# Patient Record
Sex: Female | Born: 1980 | Race: Black or African American | Hispanic: No | Marital: Married | Smoking: Never smoker
Health system: Southern US, Community
[De-identification: ages and names within clinical notes are randomized; demographics above are authoritative.]

## PROBLEM LIST (undated history)

## (undated) ENCOUNTER — Inpatient Hospital Stay (HOSPITAL_COMMUNITY): Payer: Self-pay

## (undated) DIAGNOSIS — R200 Anesthesia of skin: Secondary | ICD-10-CM

## (undated) DIAGNOSIS — G8929 Other chronic pain: Secondary | ICD-10-CM

## (undated) DIAGNOSIS — O24419 Gestational diabetes mellitus in pregnancy, unspecified control: Secondary | ICD-10-CM

## (undated) DIAGNOSIS — E119 Type 2 diabetes mellitus without complications: Secondary | ICD-10-CM

## (undated) DIAGNOSIS — R202 Paresthesia of skin: Secondary | ICD-10-CM

## (undated) HISTORY — DX: Type 2 diabetes mellitus without complications: E11.9

## (undated) HISTORY — DX: Anesthesia of skin: R20.2

## (undated) HISTORY — DX: Other chronic pain: G89.29

## (undated) HISTORY — DX: Anesthesia of skin: R20.0

---

## 2014-08-28 ENCOUNTER — Inpatient Hospital Stay (HOSPITAL_COMMUNITY)
Admission: AD | Admit: 2014-08-28 | Discharge: 2014-08-28 | Disposition: A | Discharge status: Home / Self Care | Payer: Self-pay | Source: Ambulatory Visit | Attending: Obstetrics & Gynecology | Admitting: Obstetrics & Gynecology

## 2014-08-28 ENCOUNTER — Inpatient Hospital Stay (HOSPITAL_COMMUNITY): Payer: Self-pay

## 2014-08-28 ENCOUNTER — Encounter (HOSPITAL_COMMUNITY): Payer: Self-pay | Admitting: *Deleted

## 2014-08-28 DIAGNOSIS — O020 Blighted ovum and nonhydatidiform mole: Secondary | ICD-10-CM | POA: Insufficient documentation

## 2014-08-28 DIAGNOSIS — R102 Pelvic and perineal pain: Secondary | ICD-10-CM | POA: Insufficient documentation

## 2014-08-28 DIAGNOSIS — R58 Hemorrhage, not elsewhere classified: Secondary | ICD-10-CM

## 2014-08-28 DIAGNOSIS — O0289 Other abnormal products of conception: Secondary | ICD-10-CM

## 2014-08-28 LAB — CBC
HCT: 32.7 % — ABNORMAL LOW (ref 36.0–46.0)
Hemoglobin: 10.7 g/dL — ABNORMAL LOW (ref 12.0–15.0)
MCH: 25.5 pg — ABNORMAL LOW (ref 26.0–34.0)
MCHC: 32.7 g/dL (ref 30.0–36.0)
MCV: 78 fL (ref 78.0–100.0)
Platelets: 259 10*3/uL (ref 150–400)
RBC: 4.19 MIL/uL (ref 3.87–5.11)
RDW: 21 % — ABNORMAL HIGH (ref 11.5–15.5)
WBC: 9.6 10*3/uL (ref 4.0–10.5)

## 2014-08-28 LAB — COMPREHENSIVE METABOLIC PANEL
ALT: 20 U/L (ref 0–35)
AST: 20 U/L (ref 0–37)
Albumin: 3.8 g/dL (ref 3.5–5.2)
Alkaline Phosphatase: 66 U/L (ref 39–117)
Anion gap: 10 (ref 5–15)
BUN: 7 mg/dL (ref 6–23)
CO2: 25 mmol/L (ref 19–32)
Calcium: 9.5 mg/dL (ref 8.4–10.5)
Chloride: 100 mmol/L (ref 96–112)
Creatinine, Ser: 0.63 mg/dL (ref 0.50–1.10)
GFR calc Af Amer: >90 mL/min (ref >90)
GFR calc non Af Amer: >90 mL/min (ref >90)
Glucose, Bld: 82 mg/dL (ref 70–99)
Potassium: 3.3 mmol/L — ABNORMAL LOW (ref 3.5–5.1)
Sodium: 135 mmol/L (ref 135–145)
Total Bilirubin: 0.4 mg/dL (ref 0.3–1.2)
Total Protein: 8 g/dL (ref 6.0–8.3)

## 2014-08-28 LAB — DIFFERENTIAL
Basophils Absolute: 0 10*3/uL (ref 0.0–0.1)
Basophils Relative: 0 % (ref 0–1)
Eosinophils Absolute: 0.7 10*3/uL (ref 0.0–0.7)
Eosinophils Relative: 8 % — ABNORMAL HIGH (ref 0–5)
Lymphocytes Relative: 25 % (ref 12–46)
Lymphs Abs: 2.3 10*3/uL (ref 0.7–4.0)
Monocytes Absolute: 0.5 10*3/uL (ref 0.1–1.0)
Monocytes Relative: 5 % (ref 3–12)
Neutro Abs: 5.7 10*3/uL (ref 1.7–7.7)
Neutrophils Relative %: 62 % (ref 43–77)

## 2014-08-28 LAB — HCG, QUANTITATIVE, PREGNANCY: hCG, Beta Chain, Quant, S: 129683 m[IU]/mL — ABNORMAL HIGH (ref <5)

## 2014-08-28 LAB — URINALYSIS, ROUTINE W REFLEX MICROSCOPIC
Bilirubin Urine: NEGATIVE
Glucose, UA: NEGATIVE mg/dL
Hgb urine dipstick: NEGATIVE
Ketones, ur: NEGATIVE mg/dL
Leukocytes, UA: NEGATIVE
Nitrite: NEGATIVE
PROTEIN: NEGATIVE mg/dL
Specific Gravity, Urine: 1.01 (ref 1.005–1.030)
UROBILINOGEN UA: 0.2 mg/dL (ref 0.0–1.0)
pH: 6 (ref 5.0–8.0)

## 2014-08-28 LAB — HEPATITIS B SURFACE ANTIGEN: Hepatitis B Surface Ag: NEGATIVE

## 2014-08-28 LAB — WET PREP, GENITAL
Trich, Wet Prep: NONE SEEN
Yeast Wet Prep HPF POC: NONE SEEN

## 2014-08-28 LAB — PREPARE RBC (CROSSMATCH)

## 2014-08-28 LAB — POCT PREGNANCY, URINE: PREG TEST UR: POSITIVE — AB

## 2014-08-28 LAB — ABO/RH: ABO/RH(D): O POS

## 2014-08-28 NOTE — Discharge Instructions (Signed)
Molar Pregnancy A molar pregnancy (hydatidiform mole) is a mass of tissue that grows in the uterus after conception. The mass is created by an egg that was not fertilized correctly and abnormally grows. It is an abnormal pregnancy and does not develop into a fetus. If a molar pregnancy is suspected by your health care provider, treatment is required. CAUSES  Molar pregnancy is caused by an egg that is fertilized incorrectly so that it has abnormal genetic material (chromosomes). This can result in one of 2 types of molar pregnancy:  Complete molar pregnancy--All of the chromosomes in the fertilized egg come from the father; none come from the mother.  Partial molar pregnancy--The fertilized egg has chromosomes from the father and mother, but it has too many chromosomes. RISK FACTORS  Certain risk factors make a molar pregnancy more likely. They include:   Being over age 35 or under age 20.  History of a molar pregnancy in the past (extremely small chance of recurrence). Other possible risk factors include:   Smoking more than 15 cigarettes per day.  History of infertility.  Having a certain blood type (A, B, AB).  Having a vitamin A deficiency.  Using oral contraceptives. SIGNS AND SYMPTOMS   Vaginal bleeding.  Missed menstrual period.  Uterus grows quicker than normal.  Severe nausea and vomiting.  Severe pressure or pain in the uterus.  Abnormal ovarian cysts (theca lutein cysts).  Discharge from the vagina that looks like grapes.  High blood pressure (early onset of preeclampsia).  Overactive thyroid (hyperthyroidism).  Anemia. DIAGNOSIS  If your health care provider thinks there is a chance of a molar pregnancy, testing will be recommended. Possible tests include:   An ultrasound test.  Blood tests. TREATMENT  Most molar pregnancies end on their own by miscarriage. However, a health care provider needs to make sure that all the abnormal tissue is out of the  womb. This can be done with dilation and curettage (D&C) or suction curettage. In this procedure, any remaining molar tissue is removed through the vagina. After diagnosis of a molar pregnancy, the pregnancy hormone levels must be followed until the level is zero. If the pregnancy hormone level does not drop appropriately, chemotherapy may be necessary. Also, you will be given a medicine called Rho (D) immune globulin if you are Rh negative and your sex partner is Rh positive. This helps prevent Rh problems in future pregnancies. HOME CARE INSTRUCTIONS   Avoid getting pregnant for 6-12 months or as directed by your health care provider. Use a reliable form of birth control or do not have sex.  Only take over-the-counter or prescription medicine as directed by your health care provider.  Keep all follow-up appointments and get all suggested lab tests and ultrasound tests.  Gradually return to normal activities.  Think about joining a support group. Ask for help if you are struggling with grief. Document Released: 01/16/2011 Document Revised: 09/14/2013 Document Reviewed: 11/27/2012 [employer] Patient Information 2015 [employer]. This information is not intended to replace advice given to you by your health care provider. Make sure you discuss any questions you have with your health care provider.  

## 2014-08-28 NOTE — MAU Note (Signed)
Pt was lying down and felt like she needed to use the bathroom then when she went it was just blood that came out with some small clots.  Denies abdominal pain but does have some pressure.

## 2014-08-28 NOTE — MAU Provider Note (Signed)
History     CSN: [id number]  Arrival date and time: 08/28/14 1255   None     Chief Complaint  Patient presents with  . Vaginal Bleeding   Vaginal Bleeding The patient's primary symptoms include missed menses, pelvic pain and vaginal bleeding. This is a new problem. The current episode started today. The pain is mild. She is pregnant. Associated symptoms include abdominal pain. The vaginal bleeding is heavier than menses. She has been passing clots. She has not been passing tissue. Nothing aggravates the symptoms. She has tried nothing for the symptoms. She is sexually active. It is unknown whether or not her partner has an STD.    34 y.o. [id number]  presents to the MAU with the complaint of when she was  Expand All Collapse All    lying down and felt like she needed to use the bathroom then when she went it was just blood that came out with some small clots. Denies abdominal pain but does have some pressure.       Past Medical History  Diagnosis Date  . Medical history non-contributory     Past Surgical History  Procedure Laterality Date  . Cesarean section      History reviewed. No pertinent family history.  History  Substance Use Topics  . Smoking status: Never Smoker   . Smokeless tobacco: Not on file  . Alcohol Use: No    Allergies: No Known Allergies  Prescriptions prior to admission  Medication Sig Dispense Refill Last Dose  . Prenatal Vit-Fe Fumarate-FA (PRENATAL MULTIVITAMIN) TABS tablet Take 1 tablet by mouth daily at 12 noon.   08/27/2014 at Unknown time    Review of Systems  Eyes: Positive for redness.       Right eye  Gastrointestinal: Positive for abdominal pain.  Genitourinary: Positive for vaginal bleeding, pelvic pain and missed menses.       Vaginal bleeding  All other systems reviewed and are negative.  Physical Exam   Blood pressure 142/87, pulse 92, temperature 97.5 F (36.4 C), temperature source Oral, resp. rate 18, last  menstrual period 06/19/2014.  Physical Exam  Nursing note and vitals reviewed. Constitutional: She is oriented to person, place, and time. She appears well-developed and well-nourished. No distress.  HENT:  Head: Normocephalic and atraumatic.  Eyes:  right eye red  Cardiovascular: Normal rate.   Respiratory: Effort normal. No respiratory distress.  Genitourinary: Vagina normal. There is no rash, tenderness, lesion or injury on the right labia. There is no rash, tenderness, lesion or injury on the left labia. Uterus is enlarged. Cervix exhibits discharge.    Musculoskeletal: Normal range of motion.  Neurological: She is alert and oriented to person, place, and time.  Skin: Skin is warm and dry.  Psychiatric: She has a normal mood and affect. Her behavior is normal. Judgment and thought content normal.    MAU Course  Procedures  MDM Results for orders placed or performed during the hospital encounter of 08/28/14 (from the past 24 hour(s))  CBC     Status: Abnormal   Collection Time: 08/28/14  1:54 PM  Result Value Ref Range   WBC 9.6 4.0 - 10.5 K/uL   RBC 4.19 3.87 - 5.11 MIL/uL   Hemoglobin 10.7 (L) 12.0 - 15.0 g/dL   HCT 78.4 (L) 69.6 - 29.5 %   MCV 78.0 78.0 - 100.0 fL   MCH 25.5 (L) 26.0 - 34.0 pg   MCHC 32.7 30.0 - 36.0 g/dL   RDW  21.0 (H) 11.5 - 15.5 %   Platelets 259 150 - 400 K/uL  ABO/Rh     Status: None (Preliminary result)   Collection Time: 08/28/14  1:54 PM  Result Value Ref Range   ABO/RH(D) O POS   hCG, quantitative, pregnancy     Status: Abnormal   Collection Time: 08/28/14  1:54 PM  Result Value Ref Range   hCG, Beta Nyra JabsChain, Quant, S [id number] (H) <5 mIU/mL  Urinalysis, Routine w reflex microscopic     Status: None   Collection Time: 08/28/14  1:54 PM  Result Value Ref Range   Color, Urine YELLOW YELLOW   APPearance CLEAR CLEAR   Specific Gravity, Urine 1.010 1.005 - 1.030   pH 6.0 5.0 - 8.0   Glucose, UA NEGATIVE NEGATIVE mg/dL   Hgb urine dipstick  NEGATIVE NEGATIVE   Bilirubin Urine NEGATIVE NEGATIVE   Ketones, ur NEGATIVE NEGATIVE mg/dL   Protein, ur NEGATIVE NEGATIVE mg/dL   Urobilinogen, UA 0.2 0.0 - 1.0 mg/dL   Nitrite NEGATIVE NEGATIVE   Leukocytes, UA NEGATIVE NEGATIVE  Differential     Status: Abnormal   Collection Time: 08/28/14  1:54 PM  Result Value Ref Range   Neutrophils Relative % 62 43 - 77 %   Neutro Abs 5.7 1.7 - 7.7 K/uL   Lymphocytes Relative 25 12 - 46 %   Lymphs Abs 2.3 0.7 - 4.0 K/uL   Monocytes Relative 5 3 - 12 %   Monocytes Absolute 0.5 0.1 - 1.0 K/uL   Eosinophils Relative 8 (H) 0 - 5 %   Eosinophils Absolute 0.7 0.0 - 0.7 K/uL   Basophils Relative 0 0 - 1 %   Basophils Absolute 0.0 0.0 - 0.1 K/uL  Comprehensive metabolic panel     Status: Abnormal   Collection Time: 08/28/14  1:54 PM  Result Value Ref Range   Sodium 135 135 - 145 mmol/L   Potassium 3.3 (L) 3.5 - 5.1 mmol/L   Chloride 100 96 - 112 mmol/L   CO2 25 19 - 32 mmol/L   Glucose, Bld 82 70 - 99 mg/dL   BUN 7 6 - 23 mg/dL   Creatinine, Ser 9.140.63 0.50 - 1.10 mg/dL   Calcium 9.5 8.4 - 78.210.5 mg/dL   Total Protein 8.0 6.0 - 8.3 g/dL   Albumin 3.8 3.5 - 5.2 g/dL   AST 20 0 - 37 U/L   ALT 20 0 - 35 U/L   Alkaline Phosphatase 66 39 - 117 U/L   Total Bilirubin 0.4 0.3 - 1.2 mg/dL   GFR calc non Af Amer >90 >90 mL/min   GFR calc Af Amer >90 >90 mL/min   Anion gap 10 5 - 15  Pregnancy, urine POC     Status: Abnormal   Collection Time: 08/28/14  2:00 PM  Result Value Ref Range   Preg Test, Ur POSITIVE (A) NEGATIVE  Wet prep, genital     Status: Abnormal   Collection Time: 08/28/14  2:40 PM  Result Value Ref Range   Yeast Wet Prep HPF POC NONE SEEN NONE SEEN   Trich, Wet Prep NONE SEEN NONE SEEN   Clue Cells Wet Prep HPF POC FEW (A) NONE SEEN   WBC, Wet Prep HPF POC FEW (A) NONE SEEN   [place] Ob Comp Less 14 Wks  08/28/2014   CLINICAL DATA:  Patient with vaginal bleeding. Gestational age by LMP 10 weeks 0 days.  EXAM: OBSTETRIC <14 WK US AND  TRANSVAGINAL OB [place]  TECHNIQUE: Both transabdominal and transvaginal ultrasound examinations were performed for complete evaluation of the gestation as well as the maternal uterus, adnexal regions, and pelvic cul-de-sac. Transvaginal technique was performed to assess early pregnancy.  COMPARISON:  None.  FINDINGS: Intrauterine gestational sac: Not present  Yolk sac:  Not present  Embryo:  Not present  Cardiac Activity: Not present  Maternal uterus/adnexae: The left ovary is unremarkable and measures 3.8 x 3.2 x 2.6 cm. The right ovary is unremarkable and measures 3.0 x 2.2 x 2.5 cm. Endometrial canal is distended and the endometrium appears to contain multiple cystic spaces with associated intervening soft tissue, findings measure 4.4 x 3.3 x 4.6 cm. No free fluid in the pelvis.  IMPRESSION: Marked thickening enlargement of the endometrium containing multiple cystic spaces. No intrauterine pregnancy is identified. In the setting of positive pregnancy test, considerations for the endometrial findings may be secondary to molar pregnancy. Recommend correlation with clinical factors and beta HCG levels. As no definite intrauterine pregnancy is identified, this be considered a pregnancy of unknown location and continued clinical followup with sonography and beta HCG levels is recommended.  These results were called by telephone at the time of interpretation on 08/28/2014 at 2:53 pm to Dr. Illene Bolus , who verbally acknowledged these results.   Electronically Signed   By: Annia Belt M.D.   On: 08/28/2014 14:56   US Ob Transvaginal  08/28/2014   CLINICAL DATA:  Patient with vaginal bleeding. Gestational age by LMP 10 weeks 0 days.  EXAM: OBSTETRIC <14 WK [place] AND TRANSVAGINAL OB US  TECHNIQUE: Both transabdominal and transvaginal ultrasound examinations were performed for complete evaluation of the gestation as well as the maternal uterus, adnexal regions, and pelvic cul-de-sac. Transvaginal technique was performed to  assess early pregnancy.  COMPARISON:  None.  FINDINGS: Intrauterine gestational sac: Not present  Yolk sac:  Not present  Embryo:  Not present  Cardiac Activity: Not present  Maternal uterus/adnexae: The left ovary is unremarkable and measures 3.8 x 3.2 x 2.6 cm. The right ovary is unremarkable and measures 3.0 x 2.2 x 2.5 cm. Endometrial canal is distended and the endometrium appears to contain multiple cystic spaces with associated intervening soft tissue, findings measure 4.4 x 3.3 x 4.6 cm. No free fluid in the pelvis.  IMPRESSION: Marked thickening enlargement of the endometrium containing multiple cystic spaces. No intrauterine pregnancy is identified. In the setting of positive pregnancy test, considerations for the endometrial findings may be secondary to molar pregnancy. Recommend correlation with clinical factors and beta HCG levels. As no definite intrauterine pregnancy is identified, this be considered a pregnancy of unknown location and continued clinical followup with sonography and beta HCG levels is recommended.  These results were called by telephone at the time of interpretation on 08/28/2014 at 2:53 pm to Dr. Illene Bolus , who verbally acknowledged these results.   Electronically Signed   By: Annia Belt M.D.   On: 08/28/2014 14:56   Dr. Penne Lash consulted. Dr. Penne Lash discussed POC with pt.   Assessment and Plan  Molar Pregnancy  Pt will be discharged and will be scheduled for Monday, D&E 1100- Constant Pt to arrive 930; NPO after midnight Discharged to home.  Dmya Long Grissett 08/28/2014, 3:52 PM

## 2014-08-29 LAB — RPR: RPR Ser Ql: NONREACTIVE

## 2014-08-29 LAB — RUBELLA SCREEN: Rubella: 14.6 index (ref >0.99)

## 2014-08-29 LAB — HIV ANTIBODY (ROUTINE TESTING W REFLEX): HIV Screen 4th Generation wRfx: NONREACTIVE

## 2014-08-30 ENCOUNTER — Ambulatory Visit (HOSPITAL_COMMUNITY): Payer: Commercial Managed Care - PPO | Admitting: Anesthesiology

## 2014-08-30 ENCOUNTER — Ambulatory Visit (HOSPITAL_COMMUNITY)
Admission: RE | Admit: 2014-08-30 | Discharge: 2014-08-30 | Disposition: A | Discharge status: Home / Self Care | Payer: Commercial Managed Care - PPO | Source: Ambulatory Visit | Attending: Obstetrics and Gynecology | Admitting: Obstetrics and Gynecology

## 2014-08-30 ENCOUNTER — Encounter (HOSPITAL_COMMUNITY)
Admission: RE | Disposition: A | Discharge status: Home / Self Care | Payer: Self-pay | Source: Ambulatory Visit | Attending: Obstetrics and Gynecology

## 2014-08-30 DIAGNOSIS — O020 Blighted ovum and nonhydatidiform mole: Secondary | ICD-10-CM | POA: Diagnosis present

## 2014-08-30 DIAGNOSIS — O0289 Other abnormal products of conception: Secondary | ICD-10-CM

## 2014-08-30 HISTORY — PX: DILATION AND EVACUATION: SHX1459

## 2014-08-30 LAB — GC/CHLAMYDIA PROBE AMP (~~LOC~~) NOT AT ARMC
Chlamydia: NEGATIVE
Neisseria Gonorrhea: NEGATIVE

## 2014-08-30 SURGERY — DILATION AND EVACUATION, UTERUS
Anesthesia: Monitor Anesthesia Care

## 2014-08-30 MED ORDER — IBUPROFEN 600 MG PO TABS: 600.0000 mg | ORAL_TABLET | Freq: Four times a day (QID) | ORAL | Status: DC | PRN

## 2014-08-30 MED ORDER — MIDAZOLAM HCL 2 MG/2ML IJ SOLN
INTRAMUSCULAR | Status: AC
  Filled 2014-08-30: qty 2

## 2014-08-30 MED ORDER — PROPOFOL 10 MG/ML IV BOLUS
INTRAVENOUS | Status: AC
  Filled 2014-08-30: qty 20

## 2014-08-30 MED ORDER — FENTANYL CITRATE (PF) 100 MCG/2ML IJ SOLN
INTRAMUSCULAR | Status: AC
  Filled 2014-08-30: qty 2

## 2014-08-30 MED ORDER — SCOPOLAMINE 1 MG/3DAYS TD PT72
1.0000 | MEDICATED_PATCH | Freq: Once | TRANSDERMAL | Status: AC
  Administered 2014-08-30: 1 via TRANSDERMAL

## 2014-08-30 MED ORDER — CHLOROPROCAINE HCL 1 % IJ SOLN
INTRAMUSCULAR | Status: AC
  Filled 2014-08-30: qty 30

## 2014-08-30 MED ORDER — DEXAMETHASONE SODIUM PHOSPHATE 10 MG/ML IJ SOLN
INTRAMUSCULAR | Status: AC
  Filled 2014-08-30: qty 1

## 2014-08-30 MED ORDER — FENTANYL CITRATE (PF) 100 MCG/2ML IJ SOLN
INTRAMUSCULAR | Status: DC | PRN
  Administered 2014-08-30: 100 ug via INTRAVENOUS

## 2014-08-30 MED ORDER — KETOROLAC TROMETHAMINE 30 MG/ML IJ SOLN
INTRAMUSCULAR | Status: AC
  Filled 2014-08-30: qty 1

## 2014-08-30 MED ORDER — LIDOCAINE HCL (CARDIAC) 20 MG/ML IV SOLN
INTRAVENOUS | Status: DC | PRN
  Administered 2014-08-30: 50 mg via INTRAVENOUS

## 2014-08-30 MED ORDER — ONDANSETRON HCL 4 MG/2ML IJ SOLN
INTRAMUSCULAR | Status: AC
  Filled 2014-08-30: qty 2

## 2014-08-30 MED ORDER — CHLOROPROCAINE HCL 1 % IJ SOLN
INTRAMUSCULAR | Status: DC | PRN
  Administered 2014-08-30: 20 mL

## 2014-08-30 MED ORDER — LACTATED RINGERS IV SOLN
INTRAVENOUS | Status: DC
  Administered 2014-08-30 (×2): via INTRAVENOUS

## 2014-08-30 MED ORDER — GLYCOPYRROLATE 0.2 MG/ML IJ SOLN
INTRAMUSCULAR | Status: DC | PRN
  Administered 2014-08-30: 0.2 mg via INTRAVENOUS

## 2014-08-30 MED ORDER — ONDANSETRON HCL 4 MG/2ML IJ SOLN
INTRAMUSCULAR | Status: DC | PRN
  Administered 2014-08-30: 4 mg via INTRAVENOUS

## 2014-08-30 MED ORDER — MIDAZOLAM HCL 2 MG/2ML IJ SOLN
INTRAMUSCULAR | Status: DC | PRN
  Administered 2014-08-30: 2 mg via INTRAVENOUS

## 2014-08-30 MED ORDER — DEXAMETHASONE SODIUM PHOSPHATE 10 MG/ML IJ SOLN
INTRAMUSCULAR | Status: DC | PRN
  Administered 2014-08-30: 5 mg via INTRAVENOUS

## 2014-08-30 MED ORDER — OXYCODONE-ACETAMINOPHEN 5-325 MG PO TABS: 1.0000 | ORAL_TABLET | ORAL | Status: DC | PRN

## 2014-08-30 MED ORDER — LIDOCAINE HCL (CARDIAC) 20 MG/ML IV SOLN
INTRAVENOUS | Status: AC
  Filled 2014-08-30: qty 5

## 2014-08-30 MED ORDER — PROPOFOL INFUSION 10 MG/ML OPTIME
INTRAVENOUS | Status: DC | PRN
  Administered 2014-08-30: 300 ug/kg/min via INTRAVENOUS
  Administered 2014-08-30: 11:00:00 via INTRAVENOUS

## 2014-08-30 MED ORDER — GLYCOPYRROLATE 0.2 MG/ML IJ SOLN
INTRAMUSCULAR | Status: AC
  Filled 2014-08-30: qty 1

## 2014-08-30 MED ORDER — DOCUSATE SODIUM 100 MG PO CAPS: 100.0000 mg | ORAL_CAPSULE | Freq: Two times a day (BID) | ORAL | Status: DC | PRN

## 2014-08-30 MED ORDER — KETOROLAC TROMETHAMINE 30 MG/ML IJ SOLN
INTRAMUSCULAR | Status: DC | PRN
  Administered 2014-08-30: 30 mg via INTRAVENOUS

## 2014-08-30 MED ORDER — SCOPOLAMINE 1 MG/3DAYS TD PT72
MEDICATED_PATCH | TRANSDERMAL | Status: AC
  Administered 2014-08-30 (×2): 1 via TRANSDERMAL
  Filled 2014-08-30: qty 1

## 2014-08-30 SURGICAL SUPPLY — 17 items
CATH ROBINSON RED A/P 16FR (CATHETERS) ×3 IMPLANT
DECANTER SPIKE VIAL GLASS SM (MISCELLANEOUS) ×3 IMPLANT
GLOVE BIOGEL PI IND STRL 6.5 (GLOVE) ×1 IMPLANT
GLOVE BIOGEL PI INDICATOR 6.5 (GLOVE) ×2
GLOVE SURG SS PI 6.0 STRL IVOR (GLOVE) ×3 IMPLANT
GOWN STRL REUS W/TWL LRG LVL3 (GOWN DISPOSABLE) ×6 IMPLANT
KIT BERKELEY 1ST TRIMESTER 3/8 (MISCELLANEOUS) ×3 IMPLANT
NS IRRIG 1000ML POUR BTL (IV SOLUTION) ×3 IMPLANT
PACK VAGINAL MINOR WOMEN LF (CUSTOM PROCEDURE TRAY) ×3 IMPLANT
PAD OB MATERNITY 4.3X12.25 (PERSONAL CARE ITEMS) ×3 IMPLANT
PAD PREP 24X48 CUFFED NSTRL (MISCELLANEOUS) ×3 IMPLANT
SET BERKELEY SUCTION TUBING (SUCTIONS) ×3 IMPLANT
TOWEL OR 17X24 6PK STRL BLUE (TOWEL DISPOSABLE) ×6 IMPLANT
VACURETTE 10 RIGID CVD (CANNULA) ×3 IMPLANT
VACURETTE 7MM CVD STRL WRAP (CANNULA) IMPLANT
VACURETTE 8 RIGID CVD (CANNULA) IMPLANT
VACURETTE 9 RIGID CVD (CANNULA) IMPLANT

## 2014-08-30 NOTE — H&P (Signed)
Barbara Todd is an 34 y.o. female [phone] at 10 weeks by LMP and ultrasound findings suspicious for molar pregnancy presenting today for scheduled D&E. Patient is without other complaints.  Pertinent Gynecological History: Contraception: none DES exposure: denies Blood transfusions: none Sexually transmitted diseases: no past history Previous GYN Procedures: DNC   Menstrual History: Patient's last menstrual period was 06/19/2014.    Past Medical History  Diagnosis Date  . Medical history non-contributory     Past Surgical History  Procedure Laterality Date  . Cesarean section      No family history on file.  Social History:  reports that she has never smoked. She does not have any smokeless tobacco history on file. She reports that she does not drink alcohol or use illicit drugs.  Allergies: No Known Allergies  Prescriptions prior to admission  Medication Sig Dispense Refill Last Dose  . Prenatal Vit-Fe Fumarate-FA (PRENATAL MULTIVITAMIN) TABS tablet Take 1 tablet by mouth daily at 12 noon.   08/27/2014 at Unknown time    Review of Systems  All other systems reviewed and are negative.   Blood pressure 122/76, pulse 97, temperature 98.4 F (36.9 C), temperature source Oral, resp. rate 18, height  (1.575 m), weight 140 lb (63.504 kg), last menstrual period 06/19/2014, SpO2 100 %. Physical Exam  No results found for this or any previous visit (from the past 24 hour(s)).  US Ob Comp Less 14 Wks  08/28/2014   CLINICAL DATA:  Patient with vaginal bleeding. Gestational age by LMP 10 weeks 0 days.  EXAM: OBSTETRIC <14 WK [place] AND TRANSVAGINAL OB US  TECHNIQUE: Both transabdominal and transvaginal ultrasound examinations were performed for complete evaluation of the gestation as well as the maternal uterus, adnexal regions, and pelvic cul-de-sac. Transvaginal technique was performed to assess early pregnancy.  COMPARISON:  None.  FINDINGS: Intrauterine gestational sac: Not  present  Yolk sac:  Not present  Embryo:  Not present  Cardiac Activity: Not present  Maternal uterus/adnexae: The left ovary is unremarkable and measures 3.8 x 3.2 x 2.6 cm. The right ovary is unremarkable and measures 3.0 x 2.2 x 2.5 cm. Endometrial canal is distended and the endometrium appears to contain multiple cystic spaces with associated intervening soft tissue, findings measure 4.4 x 3.3 x 4.6 cm. No free fluid in the pelvis.  IMPRESSION: Marked thickening enlargement of the endometrium containing multiple cystic spaces. No intrauterine pregnancy is identified. In the setting of positive pregnancy test, considerations for the endometrial findings may be secondary to molar pregnancy. Recommend correlation with clinical factors and beta HCG levels. As no definite intrauterine pregnancy is identified, this be considered a pregnancy of unknown location and continued clinical followup with sonography and beta HCG levels is recommended.  These results were called by telephone at the time of interpretation on 08/28/2014 at 2:53 pm to Dr. Illene Bolus , who verbally acknowledged these results.   Electronically Signed   By: Annia Belt M.D.   On: 08/28/2014 14:56   US Ob Transvaginal  08/28/2014   CLINICAL DATA:  Patient with vaginal bleeding. Gestational age by LMP 10 weeks 0 days.  EXAM: OBSTETRIC <14 WK [place] AND TRANSVAGINAL OB US  TECHNIQUE: Both transabdominal and transvaginal ultrasound examinations were performed for complete evaluation of the gestation as well as the maternal uterus, adnexal regions, and pelvic cul-de-sac. Transvaginal technique was performed to assess early pregnancy.  COMPARISON:  None.  FINDINGS: Intrauterine gestational sac: Not present  Yolk sac:  Not present  Embryo:  Not present  Cardiac Activity: Not present  Maternal uterus/adnexae: The left ovary is unremarkable and measures 3.8 x 3.2 x 2.6 cm. The right ovary is unremarkable and measures 3.0 x 2.2 x 2.5 cm. Endometrial canal is  distended and the endometrium appears to contain multiple cystic spaces with associated intervening soft tissue, findings measure 4.4 x 3.3 x 4.6 cm. No free fluid in the pelvis.  IMPRESSION: Marked thickening enlargement of the endometrium containing multiple cystic spaces. No intrauterine pregnancy is identified. In the setting of positive pregnancy test, considerations for the endometrial findings may be secondary to molar pregnancy. Recommend correlation with clinical factors and beta HCG levels. As no definite intrauterine pregnancy is identified, this be considered a pregnancy of unknown location and continued clinical followup with sonography and beta HCG levels is recommended.  These results were called by telephone at the time of interpretation on 08/28/2014 at 2:53 pm to Dr. Illene BolusLORI CLEMMONS , who verbally acknowledged these results.   Electronically Signed   By: Annia Beltrew  Davis M.D.   On: 08/28/2014 14:56    Assessment/Plan: 34 yo [id number] with a molar pregnancy here for D&E Risks, benefits and alternatives were explained including but not limited to risks of bleeding, infection, uterine perforation and damage to adjacent organs. Patient verbalized understanding and all questions were answered.  Barbara Todd 08/30/2014, 10:17 AM

## 2014-08-30 NOTE — Anesthesia Postprocedure Evaluation (Signed)
  Anesthesia Post-op Note  Patient: Barbara Todd  Procedure(s) Performed: Procedure(s) (LRB): DILATATION AND EVACUATION (N/A)  Patient Location: PACU  Anesthesia Type: MAC  Level of Consciousness: awake and alert   Airway and Oxygen Therapy: Patient Spontanous Breathing  Post-op Pain: mild  Post-op Assessment: Post-op Vital signs reviewed, Patient's Cardiovascular Status Stable, Respiratory Function Stable, Patent Airway and No signs of Nausea or vomiting  Last Vitals:  Filed Vitals:   08/30/14 1015  BP: 122/76  Pulse: 97  Temp: 36.9 C  Resp: 18    Post-op Vital Signs: stable   Complications: No apparent anesthesia complications

## 2014-08-30 NOTE — Transfer of Care (Signed)
Immediate Anesthesia Transfer of Care Note  Patient: Barbara Todd  Procedure(s) Performed: Procedure(s): DILATATION AND EVACUATION (N/A)  Patient Location: PACU  Anesthesia Type:MAC  Level of Consciousness: awake, alert  and oriented  Airway & Oxygen Therapy: Patient Spontanous Breathing and Patient connected to nasal cannula oxygen  Post-op Assessment: Report given to RN, Post -op Vital signs reviewed and stable and Patient moving all extremities X 4  Post vital signs: Reviewed and stable  Last Vitals:  Filed Vitals:   08/30/14 1015  BP: 122/76  Pulse: 97  Temp: 36.9 C  Resp: 18    Complications: No apparent anesthesia complications

## 2014-08-30 NOTE — Discharge Instructions (Signed)
°  No Ibuprofen containing products (ie. Advil, Aleve, Motrin, etc.) until after 5:15 pm today.   Dilation and Curettage or Vacuum Curettage, Care After Refer to this sheet in the next few weeks. These instructions provide you with information on caring for yourself after your procedure. Your health care provider may also give you more specific instructions. Your treatment has been planned according to current medical practices, but problems sometimes occur. Call your health care provider if you have any problems or questions after your procedure. WHAT TO EXPECT AFTER THE PROCEDURE After your procedure, it is typical to have light cramping and bleeding. This may last for 2 days to 2 weeks after the procedure. HOME CARE INSTRUCTIONS   Do not drive for 24 hours.  Wait 1 week before returning to strenuous activities.  Take your temperature 2 times a day for 4 days and write it down. Provide these temperatures to your health care provider if you develop a fever.  Avoid long periods of standing.  Avoid heavy lifting, pushing, or pulling. Do not lift anything heavier than 10 pounds (4.5 kg).  Limit stair climbing to once or twice a day.  Take rest periods often.  You may resume your usual diet.  Drink enough fluids to keep your urine clear or pale yellow.  Your usual bowel function should return. If you have constipation, you may:  Take a mild laxative with permission from your health care provider.  Add fruit and bran to your diet.  Drink more fluids.  Take showers instead of baths until your health care provider gives you permission to take baths.  Do not go swimming or use a hot tub until your health care provider approves.  Try to have someone with you or available to you the first 24-48 hours, especially if you were given a general anesthetic.  Do not douche, use tampons, or have sex (intercourse) for 2 weeks after the procedure.  Only take over-the-counter or prescription  medicines as directed by your health care provider. Do not take aspirin. It can cause bleeding.  Follow up with your health care provider as directed. SEEK MEDICAL CARE IF:   You have increasing cramps or pain that is not relieved with medicine.  You have abdominal pain that does not seem to be related to the same area of earlier cramping and pain.  You have bad smelling vaginal discharge.  You have a rash.  You are having problems with any medicine. SEEK IMMEDIATE MEDICAL CARE IF:   You have bleeding that is heavier than a normal menstrual period.  You have a fever.  You have chest pain.  You have shortness of breath.  You feel dizzy or feel like fainting.  You pass out.  You have pain in your shoulder strap area.  You have heavy vaginal bleeding with or without blood clots. MAKE SURE YOU:   Understand these instructions.  Will watch your condition.  Will get help right away if you are not doing well or get worse. Document Released: 04/27/2000 Document Revised: 05/05/2013 Document Reviewed: 11/27/2012 [facility] Patient Information 2015 [place], [place]. This information is not intended to replace advice given to you by your health care provider. Make sure you discuss any questions you have with your health care provider.

## 2014-08-30 NOTE — Op Note (Signed)
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 POSTOPERATIVE DIAGNOSIS: The same. PROCEDURE:     Dilation and Evacuation. SURGEON:  Dr. Catalina AntiguaPeggy Gustabo Gordillo  INDICATIONS: 34 y.o. [id number] with ultrasound findings suspicious for molar pregnancy, needing surgical treatment.  Risks of surgery were discussed with the patient including but not limited to: bleeding which may require transfusion; infection which may require antibiotics; injury to uterus or surrounding organs;need for additional procedures including laparotomy or laparoscopy; possibility of intrauterine scarring which may impair future fertility; and other postoperative/anesthesia complications. Written informed consent was obtained.    FINDINGS:  A 10-week size anteverted uterus, moderate amounts of products of conception, specimen sent to pathology.  ANESTHESIA:    Monitored intravenous sedation, paracervical block. INTRAVENOUS FLUIDS:  1000 ml of LR ESTIMATED BLOOD LOSS:  Less than 20 ml. SPECIMENS:  Products of conception sent to pathology COMPLICATIONS:  None immediate.  PROCEDURE DETAILS:  The patient received intravenous antibiotics while in the preoperative area.  She was then taken to the operating room where general anesthesia was administered and was found to be adequate.  After an adequate timeout was performed, she was placed in the dorsal lithotomy position and examined; then prepped and draped in the sterile manner.   Her bladder was catheterized for an unmeasured amount of clear, yellow urine. A vaginal speculum was then placed in the patient's vagina and a single tooth tenaculum was applied to the anterior lip of the cervix.  A paracervical block using 0.5% Marcaine was administered. The cervix was gently dilated to accommodate a 10 mm suction curette that was gently advanced to the uterine fundus.  The suction device was then activated and curette slowly rotated to clear the uterus of  products of conception.  A sharp curettage was then performed to confirm complete emptying of the uterus. There was minimal bleeding noted and the tenaculum removed with good hemostasis noted.   All instruments were removed from the patient's vagina. The patient tolerated the procedure well and was taken to the recovery area awake, and in stable condition.  The patient will be discharged to home as per PACU criteria.  Routine postoperative instructions given.  She was prescribed Percocet, Ibuprofen and Colace.  She will follow up in the clinic in 2 weeks for postoperative evaluation.

## 2014-08-30 NOTE — Anesthesia Preprocedure Evaluation (Addendum)
Anesthesia Evaluation  Patient identified by MRN, date of birth, ID band Patient awake    Reviewed: Allergy & Precautions, NPO status , Patient's Chart, lab work & pertinent test results  History of Anesthesia Complications Negative for: history of anesthetic complications  Airway Mallampati: II  TM Distance: >3 FB Neck ROM: Full    Dental no notable dental hx. 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   Dental advisory given  Plan Discussed with:   Anesthesia Plan Comments:        Anesthesia Quick Evaluation

## 2014-08-31 ENCOUNTER — Encounter (HOSPITAL_COMMUNITY): Payer: Self-pay | Admitting: Obstetrics and Gynecology

## 2014-09-01 LAB — TYPE AND SCREEN
ABO/RH(D): O POS
Antibody Screen: NEGATIVE
Unit division: 0
Unit division: 0

## 2014-09-08 ENCOUNTER — Encounter: Payer: Self-pay | Admitting: Obstetrics and Gynecology

## 2014-09-08 DIAGNOSIS — O010 Classical hydatidiform mole: Secondary | ICD-10-CM | POA: Insufficient documentation

## 2014-09-15 ENCOUNTER — Encounter: Payer: Self-pay | Admitting: *Deleted

## 2014-09-17 ENCOUNTER — Encounter: Payer: Self-pay | Admitting: Obstetrics and Gynecology

## 2014-09-17 ENCOUNTER — Ambulatory Visit (INDEPENDENT_AMBULATORY_CARE_PROVIDER_SITE_OTHER): Payer: Self-pay | Admitting: Obstetrics and Gynecology

## 2014-09-17 VITALS — BP 118/64 | HR 95 | Temp 98.4°F | Ht 62.0 in | Wt 144.5 lb

## 2014-09-17 DIAGNOSIS — Z9889 Other specified postprocedural states: Secondary | ICD-10-CM

## 2014-09-17 DIAGNOSIS — O010 Classical hydatidiform mole: Secondary | ICD-10-CM

## 2014-09-17 DIAGNOSIS — O0289 Other abnormal products of conception: Secondary | ICD-10-CM

## 2014-09-17 DIAGNOSIS — O020 Blighted ovum and nonhydatidiform mole: Secondary | ICD-10-CM

## 2014-09-17 MED ORDER — NORELGESTROMIN-ETH ESTRADIOL 150-35 MCG/24HR TD PTWK: 1.0000 | MEDICATED_PATCH | TRANSDERMAL | Status: DC

## 2014-09-17 NOTE — Progress Notes (Signed)
Patient ID: Barbara Todd, female   DOB: 11-Feb-1981, 34 y.o.   MRN: [id number] 34 yo [id number] s/p D&E on 08/30/2014 presenting today for post op check. Patient reports doing well since the procedure and denies any further vaginal bleeding or pelvic pain. She has not been sexually active and is interested in contraception  Past Medical History  Diagnosis Date  . Medical history non-contributory    Past Surgical History  Procedure Laterality Date  . Cesarean section    . Dilation and evacuation N/A 08/30/2014    Procedure: DILATATION AND EVACUATION;  Surgeon: Catalina AntiguaPeggy Cliford Sequeira, MD;  Location: [facility];  Service: Gynecology;  Laterality: N/A;   No family history on file. History  Substance Use Topics  . Smoking status: Never Smoker   . Smokeless tobacco: Not on file  . Alcohol Use: No   ROS Pertinent in HPI  Filed Vitals:   09/17/14 0935  BP: 118/64  Pulse: 95  Temp: 98.4 F (36.9 C)   GENERAL: Well-developed, well-nourished female in no acute distress.  ABDOMEN: Soft, nontender, nondistended. No organomegaly. PELVIC: Normal external female genitalia. Vagina is pink and rugated.  Normal discharge. Normal appearing cervix. Uterus is normal in size. No adnexal mass or tenderness. EXTREMITIES: No cyanosis, clubbing, or edema, 2+ distal pulses.  A/P 34 yo here for post op check s/p D&E on 4/18 - Pathology results reviewed and explained with the patient with consisted of a complete hydatiform mole - Explained to the patient that she will need follow up for 1 year and that it is imperative that she remains on contraception during that time to prevent pregnancy - Discussed surveillance schedule of weekly HCG for 3 weeks followed by monthly for 6 months - Patient verbalized understanding and all questions were answered. - Rx for Ortho evra patch provided as the patient has used it in the past

## 2014-09-17 NOTE — Patient Instructions (Signed)
Molar Pregnancy A molar pregnancy (hydatidiform mole) is a mass of tissue that grows in the uterus after conception. The mass is created by an egg that was not fertilized correctly and abnormally grows. It is an abnormal pregnancy and does not develop into a fetus. If a molar pregnancy is suspected by your health care provider, treatment is required. CAUSES  Molar pregnancy is caused by an egg that is fertilized incorrectly so that it has abnormal genetic material (chromosomes). This can result in one of 2 types of molar pregnancy:  Complete molar pregnancy--All of the chromosomes in the fertilized egg come from the father; none come from the mother.  Partial molar pregnancy--The fertilized egg has chromosomes from the father and mother, but it has too many chromosomes. RISK FACTORS  Certain risk factors make a molar pregnancy more likely. They include:   Being over age 35 or under age 20.  History of a molar pregnancy in the past (extremely small chance of recurrence). Other possible risk factors include:   Smoking more than 15 cigarettes per day.  History of infertility.  Having a certain blood type (A, B, AB).  Having a vitamin A deficiency.  Using oral contraceptives. SIGNS AND SYMPTOMS   Vaginal bleeding.  Missed menstrual period.  Uterus grows quicker than normal.  Severe nausea and vomiting.  Severe pressure or pain in the uterus.  Abnormal ovarian cysts (theca lutein cysts).  Discharge from the vagina that looks like grapes.  High blood pressure (early onset of preeclampsia).  Overactive thyroid (hyperthyroidism).  Anemia. DIAGNOSIS  If your health care provider thinks there is a chance of a molar pregnancy, testing will be recommended. Possible tests include:   An ultrasound test.  Blood tests. TREATMENT  Most molar pregnancies end on their own by miscarriage. However, a health care provider needs to make sure that all the abnormal tissue is out of the  womb. This can be done with dilation and curettage (D&C) or suction curettage. In this procedure, any remaining molar tissue is removed through the vagina. After diagnosis of a molar pregnancy, the pregnancy hormone levels must be followed until the level is zero. If the pregnancy hormone level does not drop appropriately, chemotherapy may be necessary. Also, you will be given a medicine called Rho (D) immune globulin if you are Rh negative and your sex partner is Rh positive. This helps prevent Rh problems in future pregnancies. HOME CARE INSTRUCTIONS   Avoid getting pregnant for 6-12 months or as directed by your health care provider. Use a reliable form of birth control or do not have sex.  Only take over-the-counter or prescription medicine as directed by your health care provider.  Keep all follow-up appointments and get all suggested lab tests and ultrasound tests.  Gradually return to normal activities.  Think about joining a support group. Ask for help if you are struggling with grief. Document Released: 01/16/2011 Document Revised: 09/14/2013 Document Reviewed: 11/27/2012 [employer] Patient Information 2015 [employer]. This information is not intended to replace advice given to you by your health care provider. Make sure you discuss any questions you have with your health care provider.  

## 2014-09-18 LAB — HCG, QUANTITATIVE, PREGNANCY: hCG, Beta Chain, Quant, S: 217.6 m[IU]/mL

## 2014-09-24 ENCOUNTER — Other Ambulatory Visit: Payer: Self-pay

## 2014-09-24 DIAGNOSIS — O039 Complete or unspecified spontaneous abortion without complication: Secondary | ICD-10-CM

## 2014-09-25 LAB — HCG, QUANTITATIVE, PREGNANCY: HCG, BETA CHAIN, QUANT, S: 58.8 m[IU]/mL

## 2014-09-28 ENCOUNTER — Telehealth: Payer: Self-pay | Admitting: *Deleted

## 2014-09-28 NOTE — Telephone Encounter (Addendum)
-----   Message from Levie HeritageJacob J Stinson, DO sent at 09/25/2014  3:24 PM EDT ----- Needs HCG next week  5/17  0845  Called pt and left message stating that I am calling with test result information. Please call back and state whether a detailed message can be left on your voice mail. Reagan Klemz RNC

## 2014-09-28 NOTE — Telephone Encounter (Signed)
Pt informed, appt scheduled.

## 2014-10-01 ENCOUNTER — Other Ambulatory Visit: Payer: Self-pay

## 2014-10-01 DIAGNOSIS — O020 Blighted ovum and nonhydatidiform mole: Secondary | ICD-10-CM

## 2014-10-02 LAB — HCG, QUANTITATIVE, PREGNANCY: HCG, BETA CHAIN, QUANT, S: 16.8 m[IU]/mL

## 2014-10-04 ENCOUNTER — Telehealth: Payer: Self-pay

## 2014-10-04 NOTE — Telephone Encounter (Signed)
Attempted to contact patient. No answer. Left message stating we are calling with results and to set up an appointment for next month, please call clinic.

## 2014-10-04 NOTE — Telephone Encounter (Signed)
Patient returned call. Informed patient of results and recommendations. Patient aware. Reports she can come in Monday June 20 at 0900 for next lab draw. Informed patient she will be followed monthly for 6 months-- appointments scheduled by Erie NoeVanessa. Patient verbalized understanding and gratitude. No questions or concerns.

## 2014-10-04 NOTE — Telephone Encounter (Signed)
-----   Message from Levie HeritageJacob J Stinson, DO sent at 10/02/2014  9:08 AM EDT ----- Needs repeat HCG monthly

## 2014-11-01 ENCOUNTER — Other Ambulatory Visit: Payer: Commercial Managed Care - PPO

## 2014-11-01 DIAGNOSIS — O4692 Antepartum hemorrhage, unspecified, second trimester: Secondary | ICD-10-CM

## 2014-11-02 ENCOUNTER — Telehealth: Payer: Self-pay

## 2014-11-02 LAB — HCG, QUANTITATIVE, PREGNANCY: hCG, Beta Chain, Quant, S: <2 m[IU]/mL

## 2014-11-02 NOTE — Telephone Encounter (Deleted)
-----   Message from Catalina Antigua, MD sent at 11/02/2014  7:03 AM EDT ----- Please inform patient of complete resolution of pregnancy

## 2014-11-02 NOTE — Telephone Encounter (Signed)
Quant results show resolution of pregnancy, per Dr. Jolayne Panther. Per molar pregnancy protocol patient to be followed for 6 months-- has monthly appointments scheduled. Called patient. No answer. Left message stating your results from yesterday were normal, however, we will continue to follow you monthly as previously discussed, and therefore will see you at your appointment on 12/01/14, please call clinic with any questions.

## 2014-12-01 ENCOUNTER — Other Ambulatory Visit: Payer: Self-pay

## 2014-12-01 DIAGNOSIS — N939 Abnormal uterine and vaginal bleeding, unspecified: Secondary | ICD-10-CM

## 2014-12-02 LAB — HCG, QUANTITATIVE, PREGNANCY: hCG, Beta Chain, Quant, S: <2 m[IU]/mL

## 2014-12-07 ENCOUNTER — Telehealth: Payer: Self-pay | Admitting: General Practice

## 2014-12-07 NOTE — Telephone Encounter (Signed)
Called patient regarding normal bhcg results and need for follow up in one month. Patient verbalized understanding and confirms she can come 8/22 @ 900am. Patient had no questions

## 2014-12-31 ENCOUNTER — Other Ambulatory Visit: Payer: Self-pay

## 2014-12-31 DIAGNOSIS — O209 Hemorrhage in early pregnancy, unspecified: Secondary | ICD-10-CM

## 2015-01-01 LAB — HCG, QUANTITATIVE, PREGNANCY: hCG, Beta Chain, Quant, S: <2 m[IU]/mL

## 2015-01-03 ENCOUNTER — Other Ambulatory Visit: Payer: Self-pay

## 2015-02-01 ENCOUNTER — Other Ambulatory Visit: Payer: Self-pay

## 2015-02-01 DIAGNOSIS — O209 Hemorrhage in early pregnancy, unspecified: Secondary | ICD-10-CM

## 2015-02-01 LAB — HCG, QUANTITATIVE, PREGNANCY: hCG, Beta Chain, Quant, S: <2 m[IU]/mL

## 2015-03-03 ENCOUNTER — Other Ambulatory Visit: Payer: Self-pay

## 2015-03-03 DIAGNOSIS — O020 Blighted ovum and nonhydatidiform mole: Secondary | ICD-10-CM

## 2015-03-04 LAB — HCG, QUANTITATIVE, PREGNANCY: hCG, Beta Chain, Quant, S: <2 m[IU]/mL

## 2015-04-01 ENCOUNTER — Other Ambulatory Visit: Payer: Self-pay

## 2015-04-01 DIAGNOSIS — O010 Classical hydatidiform mole: Secondary | ICD-10-CM

## 2015-04-01 LAB — HCG, QUANTITATIVE, PREGNANCY: hCG, Beta Chain, Quant, S: <2 m[IU]/mL

## 2015-04-04 ENCOUNTER — Telehealth: Payer: Self-pay | Admitting: General Practice

## 2015-04-04 NOTE — Telephone Encounter (Signed)
Per Dr Jolayne Pantheronstant,  Patient does not need further follow up of bhcg for her molar pregnancy as she has had a negative HCG for a little over 6 months. She may attempt conception or continue birth control. Called patient and informed her of results and recommendations. Patient verbalized understanding to all and had no questions

## 2015-09-17 ENCOUNTER — Other Ambulatory Visit: Payer: Self-pay | Admitting: Obstetrics and Gynecology

## 2015-10-17 ENCOUNTER — Other Ambulatory Visit: Payer: Self-pay | Admitting: Obstetrics and Gynecology

## 2015-11-14 ENCOUNTER — Other Ambulatory Visit: Payer: Self-pay | Admitting: Obstetrics and Gynecology

## 2015-11-30 ENCOUNTER — Other Ambulatory Visit: Payer: Self-pay | Admitting: Obstetrics and Gynecology

## 2015-12-02 ENCOUNTER — Other Ambulatory Visit: Payer: Self-pay | Admitting: Obstetrics & Gynecology

## 2016-01-02 ENCOUNTER — Encounter (HOSPITAL_COMMUNITY): Payer: Self-pay | Admitting: *Deleted

## 2016-01-02 ENCOUNTER — Inpatient Hospital Stay (HOSPITAL_COMMUNITY)
Admission: AD | Admit: 2016-01-02 | Discharge: 2016-01-03 | Disposition: A | Discharge status: Home / Self Care | Payer: Medicaid Other | Source: Ambulatory Visit | Attending: Family Medicine | Admitting: Family Medicine

## 2016-01-02 DIAGNOSIS — O26891 Other specified pregnancy related conditions, first trimester: Secondary | ICD-10-CM | POA: Insufficient documentation

## 2016-01-02 DIAGNOSIS — M549 Dorsalgia, unspecified: Secondary | ICD-10-CM

## 2016-01-02 DIAGNOSIS — M545 Low back pain: Secondary | ICD-10-CM | POA: Insufficient documentation

## 2016-01-02 DIAGNOSIS — O99891 Dorsalgia, unspecified: Secondary | ICD-10-CM

## 2016-01-02 DIAGNOSIS — R102 Pelvic and perineal pain: Secondary | ICD-10-CM | POA: Diagnosis not present

## 2016-01-02 DIAGNOSIS — Z3A01 Less than 8 weeks gestation of pregnancy: Secondary | ICD-10-CM | POA: Diagnosis not present

## 2016-01-02 DIAGNOSIS — O9989 Other specified diseases and conditions complicating pregnancy, childbirth and the puerperium: Secondary | ICD-10-CM

## 2016-01-02 LAB — HCG, QUANTITATIVE, PREGNANCY: hCG, Beta Chain, Quant, S: 16637 m[IU]/mL — ABNORMAL HIGH (ref <5)

## 2016-01-02 LAB — URINALYSIS, ROUTINE W REFLEX MICROSCOPIC
BILIRUBIN URINE: NEGATIVE
GLUCOSE, UA: NEGATIVE mg/dL
HGB URINE DIPSTICK: NEGATIVE
Ketones, ur: NEGATIVE mg/dL
Leukocytes, UA: NEGATIVE
Nitrite: NEGATIVE
Protein, ur: NEGATIVE mg/dL
pH: 6 (ref 5.0–8.0)

## 2016-01-02 LAB — CBC
HCT: 32.6 % — ABNORMAL LOW (ref 36.0–46.0)
HEMOGLOBIN: 11.2 g/dL — AB (ref 12.0–15.0)
MCH: 29.3 pg (ref 26.0–34.0)
MCHC: 34.4 g/dL (ref 30.0–36.0)
MCV: 85.3 fL (ref 78.0–100.0)
Platelets: 232 10*3/uL (ref 150–400)
RBC: 3.82 MIL/uL — ABNORMAL LOW (ref 3.87–5.11)
RDW: 13.5 % (ref 11.5–15.5)
WBC: 7.7 10*3/uL (ref 4.0–10.5)

## 2016-01-02 LAB — POCT PREGNANCY, URINE: Preg Test, Ur: POSITIVE — AB

## 2016-01-02 NOTE — MAU Note (Signed)
Pt states "i am feeling very cold and my back hurts" states her lower back hurts.

## 2016-01-03 ENCOUNTER — Encounter (HOSPITAL_COMMUNITY): Payer: Self-pay | Admitting: *Deleted

## 2016-01-03 ENCOUNTER — Inpatient Hospital Stay (HOSPITAL_COMMUNITY): Payer: Medicaid Other

## 2016-01-03 DIAGNOSIS — R102 Pelvic and perineal pain: Secondary | ICD-10-CM

## 2016-01-03 DIAGNOSIS — O26891 Other specified pregnancy related conditions, first trimester: Secondary | ICD-10-CM

## 2016-01-03 DIAGNOSIS — M549 Dorsalgia, unspecified: Secondary | ICD-10-CM | POA: Diagnosis not present

## 2016-01-03 LAB — HIV ANTIBODY (ROUTINE TESTING W REFLEX): HIV Screen 4th Generation wRfx: NONREACTIVE

## 2016-01-03 LAB — RPR: RPR Ser Ql: NONREACTIVE

## 2016-01-03 NOTE — MAU Provider Note (Signed)
History     CSN: [id number]  Arrival date and time: 01/02/16 2023   First Provider Initiated Contact with Patient 01/03/16 0120      Chief Complaint  Patient presents with  . Back Pain   Back Pain  This is a new problem. The current episode started yesterday. The problem occurs constantly. The problem is unchanged. The pain is present in the lumbar spine. The quality of the pain is described as stabbing. The pain is at a severity of 8/10. The symptoms are aggravated by sitting. Pertinent negatives include no abdominal pain, dysuria or fever. She has tried nothing for the symptoms.     Past Medical History:  Diagnosis Date  . Medical history non-contributory     Past Surgical History:  Procedure Laterality Date  . CESAREAN SECTION    . DILATION AND EVACUATION N/A 08/30/2014   Procedure: DILATATION AND EVACUATION;  Surgeon: Catalina AntiguaPeggy Constant, MD;  Location: [facility];  Service: Gynecology;  Laterality: N/A;    History reviewed. No pertinent family history.  Social History  Substance Use Topics  . Smoking status: Never Smoker  . Smokeless tobacco: Never Used  . Alcohol use No    Allergies: No Known Allergies  Prescriptions Prior to Admission  Medication Sig Dispense Refill Last Dose  . Prenatal Vit-Fe Fumarate-FA (PRENATAL MULTIVITAMIN) TABS tablet Take 1 tablet by mouth daily at 12 noon.   01/02/2016 at Unknown time  . docusate sodium (COLACE) 100 MG capsule Take 1 capsule (100 mg total) by mouth 2 (two) times daily as needed. 30 capsule 2 Taking  . ibuprofen (ADVIL,MOTRIN) 600 MG tablet Take 1 tablet (600 mg total) by mouth every 6 (six) hours as needed. (Patient not taking: Reported on 09/17/2014) 30 tablet 1 Not Taking  . oxyCODONE-acetaminophen (PERCOCET/ROXICET) 5-325 MG per tablet Take 1 tablet by mouth every 4 (four) hours as needed. (Patient not taking: Reported on 09/17/2014) 20 tablet 0 Not Taking  . XULANE 150-35 MCG/24HR transdermal patch PLACE 1 PATCH ONTO THE SKIN ONCE  WEEKLY AS DIRECTED 3 patch 0   . XULANE 150-35 MCG/24HR transdermal patch PLACE 1 PATCH ONTO THE SKIN ONCE WEEKLY AS DIRECTED 3 patch 0   . XULANE 150-35 MCG/24HR transdermal patch APPLY 1 PATCH TO SKIN AND CHANGE ONCE WEEKLY AS DIRECTED 3 patch 0     Review of Systems  Constitutional: Positive for chills. Negative for fever.  Gastrointestinal: Negative for abdominal pain, constipation, diarrhea, nausea and vomiting.  Genitourinary: Negative for dysuria, frequency and urgency.  Musculoskeletal: Positive for back pain.   Physical Exam   Blood pressure 119/80, pulse 80, temperature 98.7 F (37.1 C), temperature source Oral, resp. rate 16, height 5\' 3"  (1.6 m), weight 150 lb (68 kg), last menstrual period 10/27/2015, SpO2 100 %, unknown if currently breastfeeding.  Physical Exam  Nursing note and vitals reviewed. Constitutional: She is oriented to person, place, and time. She appears well-developed and well-nourished. No distress.  HENT:  Head: Normocephalic.  Cardiovascular: Normal rate.   Respiratory: Effort normal.  GI: Soft. There is no tenderness. There is no rebound.  Neurological: She is alert and oriented to person, place, and time.  Skin: Skin is warm and dry.  Psychiatric: She has a normal mood and affect.   Results for orders placed or performed during the hospital encounter of 01/02/16 (from the past 24 hour(s))  Urinalysis, Routine w reflex microscopic ([facility])     Status: Abnormal   Collection Time: 01/02/16  8:45 PM  Result Value Ref Range   Color, Urine YELLOW YELLOW   APPearance CLEAR CLEAR   Specific Gravity, Urine <1.005 (L) 1.005 - 1.030   pH 6.0 5.0 - 8.0   Glucose, UA NEGATIVE NEGATIVE mg/dL   Hgb urine dipstick NEGATIVE NEGATIVE   Bilirubin Urine NEGATIVE NEGATIVE   Ketones, ur NEGATIVE NEGATIVE mg/dL   Protein, ur NEGATIVE NEGATIVE mg/dL   Nitrite NEGATIVE NEGATIVE   Leukocytes, UA NEGATIVE NEGATIVE  Pregnancy, urine POC     Status: Abnormal    Collection Time: 01/02/16  9:40 PM  Result Value Ref Range   Preg Test, Ur POSITIVE (A) NEGATIVE  CBC     Status: Abnormal   Collection Time: 01/02/16 10:38 PM  Result Value Ref Range   WBC 7.7 4.0 - 10.5 K/uL   RBC 3.82 (L) 3.87 - 5.11 MIL/uL   Hemoglobin 11.2 (L) 12.0 - 15.0 g/dL   HCT 98.1 (L) 19.1 - 47.8 %   MCV 85.3 78.0 - 100.0 fL   MCH 29.3 26.0 - 34.0 pg   MCHC 34.4 30.0 - 36.0 g/dL   RDW 29.5 62.1 - 30.8 %   Platelets 232 150 - 400 K/uL  hCG, quantitative, pregnancy     Status: Abnormal   Collection Time: 01/02/16 10:38 PM  Result Value Ref Range   hCG, Beta Chain, Quant, S 16,637 (H) <5 mIU/mL   US Ob Comp Less 14 Wks  Result Date: 01/03/2016 CLINICAL DATA:  Low back pain in first-trimester pregnancy EXAM: OBSTETRIC <14 WK [place] AND TRANSVAGINAL OB US TECHNIQUE: Both transabdominal and transvaginal ultrasound examinations were performed for complete evaluation of the gestation as well as the maternal uterus, adnexal regions, and pelvic cul-de-sac. Transvaginal technique was performed to assess early pregnancy. COMPARISON:  None. FINDINGS: Intrauterine gestational sac: Single Yolk sac:  Present Embryo:  Present Cardiac Activity: Present Heart Rate: 152  bpm CRL:  11.5  mm   7 w   2 d                  [place] EDC: 08/19/2016 Subchorionic hemorrhage:  None visualized. Maternal uterus/adnexae: Corpus luteum on the right. Unremarkable left ovary. IMPRESSION: Single living intrauterine pregnancy measuring 7 weeks 2 days. No abnormality to explain pain. Electronically Signed   By: Marnee Spring M.D.   On: 01/03/2016 00:50   US Ob Transvaginal  Result Date: 01/03/2016 CLINICAL DATA:  Low back pain in first-trimester pregnancy EXAM: OBSTETRIC <14 WK [place] AND TRANSVAGINAL OB US TECHNIQUE: Both transabdominal and transvaginal ultrasound examinations were performed for complete evaluation of the gestation as well as the maternal uterus, adnexal regions, and pelvic cul-de-sac. Transvaginal technique  was performed to assess early pregnancy. COMPARISON:  None. FINDINGS: Intrauterine gestational sac: Single Yolk sac:  Present Embryo:  Present Cardiac Activity: Present Heart Rate: 152  bpm CRL:  11.5  mm   7 w   2 d                  [place] EDC: 08/19/2016 Subchorionic hemorrhage:  None visualized. Maternal uterus/adnexae: Corpus luteum on the right. Unremarkable left ovary. IMPRESSION: Single living intrauterine pregnancy measuring 7 weeks 2 days. No abnormality to explain pain. Electronically Signed   By: Marnee Spring M.D.   On: 01/03/2016 00:50    MAU Course  Procedures  MDM  Assessment and Plan   1. Back pain affecting pregnancy in first trimester   2. Pelvic pain in pregnancy, antepartum, first trimester    DC  home Comfort measures reviewed  1st Trimester precautions  RX: none  Return to MAU as needed FU with OB as planned  Follow-up Information    [facility] .   Contact information: [address] [place] [id number] [phone]            Tawnya CrookHogan, Heather Donovan 01/03/2016, 1:21 AM

## 2016-01-03 NOTE — Discharge Instructions (Signed)
Safe Medications in Pregnancy   Acne: Benzoyl Peroxide Salicylic Acid  Backache/Headache: Tylenol: 2 regular strength every 4 hours OR              2 Extra strength every 6 hours  Colds/Coughs/Allergies: Benadryl (alcohol free) 25 mg every 6 hours as needed Breath right strips Claritin Cepacol throat lozenges Chloraseptic throat spray Cold-Eeze- up to three times per day Cough drops, alcohol free Flonase (by prescription only) Guaifenesin Mucinex Robitussin DM (plain only, alcohol free) Saline nasal spray/drops Sudafed (pseudoephedrine) & Actifed ** use only after [redacted] weeks gestation and if you do not have high blood pressure Tylenol Vicks Vaporub Zinc lozenges Zyrtec   Constipation: Colace Ducolax suppositories Fleet enema Glycerin suppositories Metamucil Milk of magnesia Miralax Senokot Smooth move tea  Diarrhea: Kaopectate Imodium A-D  *NO pepto Bismol  Hemorrhoids: Anusol Anusol HC Preparation H Tucks  Indigestion: Tums Maalox Mylanta Zantac  Pepcid  Insomnia: Benadryl (alcohol free) 25mg every 6 hours as needed Tylenol PM Unisom, no Gelcaps  Leg Cramps: Tums MagGel  Nausea/Vomiting:  Bonine Dramamine Emetrol Ginger extract Sea bands Meclizine  Nausea medication to take during pregnancy:  Unisom (doxylamine succinate 25 mg tablets) Take one tablet daily at bedtime. If symptoms are not adequately controlled, the dose can be increased to a maximum recommended dose of two tablets daily (1/2 tablet in the morning, 1/2 tablet mid-afternoon and one at bedtime). Vitamin B6 100mg tablets. Take one tablet twice a day (up to 200 mg per day).  Skin Rashes: Aveeno products Benadryl cream or 25mg every 6 hours as needed Calamine Lotion 1% cortisone cream  Yeast infection: Gyne-lotrimin 7 Monistat 7   **If taking multiple medications, please check labels to avoid duplicating the same active ingredients **take medication as directed on  the label ** Do not exceed 4000 mg of tylenol in 24 hours **Do not take medications that contain aspirin or ibuprofen    First Trimester of Pregnancy The first trimester of pregnancy is from week 1 until the end of week 12 (months 1 through 3). A week after a sperm fertilizes an egg, the egg will implant on the wall of the uterus. This embryo will begin to develop into a baby. Genes from you and your partner are forming the baby. The female genes determine whether the baby is a boy or a girl. At 6-8 weeks, the eyes and face are formed, and the heartbeat can be seen on ultrasound. At the end of 12 weeks, all the baby's organs are formed.  Now that you are pregnant, you will want to do everything you can to have a healthy baby. Two of the most important things are to get good prenatal care and to follow your health care provider's instructions. Prenatal care is all the medical care you receive before the baby's birth. This care will help prevent, find, and treat any problems during the pregnancy and childbirth. BODY CHANGES Your body goes through many changes during pregnancy. The changes vary from woman to woman.   You may gain or lose a couple of pounds at first.  You may feel sick to your stomach (nauseous) and throw up (vomit). If the vomiting is uncontrollable, call your health care provider.  You may tire easily.  You may develop headaches that can be relieved by medicines approved by your health care provider.  You may urinate more often. Painful urination may mean you have a bladder infection.  You may develop heartburn as a result of your   pregnancy.  You may develop constipation because certain hormones are causing the muscles that push waste through your intestines to slow down.  You may develop hemorrhoids or swollen, bulging veins (varicose veins).  Your breasts may begin to grow larger and become tender. Your nipples may stick out more, and the tissue that surrounds them (areola)  may become darker.  Your gums may bleed and may be sensitive to brushing and flossing.  Dark spots or blotches (chloasma, mask of pregnancy) may develop on your face. This will likely fade after the baby is born.  Your menstrual periods will stop.  You may have a loss of appetite.  You may develop cravings for certain kinds of food.  You may have changes in your emotions from day to day, such as being excited to be pregnant or being concerned that something may go wrong with the pregnancy and baby.  You may have more vivid and strange dreams.  You may have changes in your hair. These can include thickening of your hair, rapid growth, and changes in texture. Some women also have hair loss during or after pregnancy, or hair that feels dry or thin. Your hair will most likely return to normal after your baby is born. WHAT TO EXPECT AT YOUR PRENATAL VISITS During a routine prenatal visit:  You will be weighed to make sure you and the baby are growing normally.  Your blood pressure will be taken.  Your abdomen will be measured to track your baby's growth.  The fetal heartbeat will be listened to starting around week 10 or 12 of your pregnancy.  Test results from any previous visits will be discussed. Your health care provider may ask you:  How you are feeling.  If you are feeling the baby move.  If you have had any abnormal symptoms, such as leaking fluid, bleeding, severe headaches, or abdominal cramping.  If you are using any tobacco products, including cigarettes, chewing tobacco, and electronic cigarettes.  If you have any questions. Other tests that may be performed during your first trimester include:  Blood tests to find your blood type and to check for the presence of any previous infections. They will also be used to check for low iron levels (anemia) and Rh antibodies. Later in the pregnancy, blood tests for diabetes will be done along with other tests if problems  develop.  Urine tests to check for infections, diabetes, or protein in the urine.  An ultrasound to confirm the proper growth and development of the baby.  An amniocentesis to check for possible genetic problems.  Fetal screens for spina bifida and Down syndrome.  You may need other tests to make sure you and the baby are doing well.  HIV (human immunodeficiency virus) testing. Routine prenatal testing includes screening for HIV, unless you choose not to have this test. HOME CARE INSTRUCTIONS  Medicines  Follow your health care provider's instructions regarding medicine use. Specific medicines may be either safe or unsafe to take during pregnancy.  Take your prenatal vitamins as directed.  If you develop constipation, try taking a stool softener if your health care provider approves. Diet  Eat regular, well-balanced meals. Choose a variety of foods, such as meat or vegetable-based protein, fish, milk and low-fat dairy products, vegetables, fruits, and whole grain breads and cereals. Your health care provider will help you determine the amount of weight gain that is right for you.  Avoid raw meat and uncooked cheese. These carry germs that can cause   birth defects in the baby.  Eating four or five small meals rather than three large meals a day may help relieve nausea and vomiting. If you start to feel nauseous, eating a few soda crackers can be helpful. Drinking liquids between meals instead of during meals also seems to help nausea and vomiting.  If you develop constipation, eat more high-fiber foods, such as fresh vegetables or fruit and whole grains. Drink enough fluids to keep your urine clear or pale yellow. Activity and Exercise  Exercise only as directed by your health care provider. Exercising will help you:  Control your weight.  Stay in shape.  Be prepared for labor and delivery.  Experiencing pain or cramping in the lower abdomen or low back is a good sign that you  should stop exercising. Check with your health care provider before continuing normal exercises.  Try to avoid standing for long periods of time. Move your legs often if you must stand in one place for a long time.  Avoid heavy lifting.  Wear low-heeled shoes, and practice good posture.  You may continue to have sex unless your health care provider directs you otherwise. Relief of Pain or Discomfort  Wear a good support bra for breast tenderness.   Take warm sitz baths to soothe any pain or discomfort caused by hemorrhoids. Use hemorrhoid cream if your health care provider approves.   Rest with your legs elevated if you have leg cramps or low back pain.  If you develop varicose veins in your legs, wear support hose. Elevate your feet for 15 minutes, 3-4 times a day. Limit salt in your diet. Prenatal Care  Schedule your prenatal visits by the twelfth week of pregnancy. They are usually scheduled monthly at first, then more often in the last 2 months before delivery.  Write down your questions. Take them to your prenatal visits.  Keep all your prenatal visits as directed by your health care provider. Safety  Wear your seat belt at all times when driving.  Make a list of emergency phone numbers, including numbers for family, friends, the hospital, and police and fire departments. General Tips  Ask your health care provider for a referral to a local prenatal education class. Begin classes no later than at the beginning of month 6 of your pregnancy.  Ask for help if you have counseling or nutritional needs during pregnancy. Your health care provider can offer advice or refer you to specialists for help with various needs.  Do not use hot tubs, steam rooms, or saunas.  Do not douche or use tampons or scented sanitary pads.  Do not cross your legs for long periods of time.  Avoid cat litter boxes and soil used by cats. These carry germs that can cause birth defects in the baby  and possibly loss of the fetus by miscarriage or stillbirth.  Avoid all smoking, herbs, alcohol, and medicines not prescribed by your health care provider. Chemicals in these affect the formation and growth of the baby.  Do not use any tobacco products, including cigarettes, chewing tobacco, and electronic cigarettes. If you need help quitting, ask your health care provider. You may receive counseling support and other resources to help you quit.  Schedule a dentist appointment. At home, brush your teeth with a soft toothbrush and be gentle when you floss. SEEK MEDICAL CARE IF:   You have dizziness.  You have mild pelvic cramps, pelvic pressure, or nagging pain in the abdominal area.  You have persistent   nausea, vomiting, or diarrhea.  You have a bad smelling vaginal discharge.  You have pain with urination.  You notice increased swelling in your face, hands, legs, or ankles. SEEK IMMEDIATE MEDICAL CARE IF:   You have a fever.  You are leaking fluid from your vagina.  You have spotting or bleeding from your vagina.  You have severe abdominal cramping or pain.  You have rapid weight gain or loss.  You vomit blood or material that looks like coffee grounds.  You are exposed to German measles and have never had them.  You are exposed to fifth disease or chickenpox.  You develop a severe headache.  You have shortness of breath.  You have any kind of trauma, such as from a fall or a car accident.   This information is not intended to replace advice given to you by your health care provider. Make sure you discuss any questions you have with your health care provider.   Document Released: 04/24/2001 Document Revised: 05/21/2014 Document Reviewed: 03/10/2013 Elsevier Interactive Patient Education 2016 [employer]  

## 2016-01-23 ENCOUNTER — Encounter: Payer: Self-pay | Admitting: Obstetrics & Gynecology

## 2016-01-23 ENCOUNTER — Ambulatory Visit (INDEPENDENT_AMBULATORY_CARE_PROVIDER_SITE_OTHER): Payer: Medicaid Other | Admitting: Obstetrics & Gynecology

## 2016-01-23 VITALS — BP 97/76 | HR 87 | Temp 98.3°F | Wt 150.0 lb

## 2016-01-23 DIAGNOSIS — Z3491 Encounter for supervision of normal pregnancy, unspecified, first trimester: Secondary | ICD-10-CM

## 2016-01-23 DIAGNOSIS — O34219 Maternal care for unspecified type scar from previous cesarean delivery: Secondary | ICD-10-CM | POA: Diagnosis not present

## 2016-01-23 DIAGNOSIS — O09A0 Supervision of pregnancy with history of molar pregnancy, unspecified trimester: Secondary | ICD-10-CM | POA: Insufficient documentation

## 2016-01-23 DIAGNOSIS — O09291 Supervision of pregnancy with other poor reproductive or obstetric history, first trimester: Secondary | ICD-10-CM | POA: Diagnosis not present

## 2016-01-23 NOTE — Patient Instructions (Signed)
First Trimester of Pregnancy The first trimester of pregnancy is from week 1 until the end of week 12 (months 1 through 3). A week after a sperm fertilizes an egg, the egg will implant on the wall of the uterus. This embryo will begin to develop into a baby. Genes from you and your partner are forming the baby. The female genes determine whether the baby is a boy or a girl. At 6-8 weeks, the eyes and face are formed, and the heartbeat can be seen on ultrasound. At the end of 12 weeks, all the baby's organs are formed.  Now that you are pregnant, you will want to do everything you can to have a healthy baby. Two of the most important things are to get good prenatal care and to follow your health care provider's instructions. Prenatal care is all the medical care you receive before the baby's birth. This care will help prevent, find, and treat any problems during the pregnancy and childbirth. BODY CHANGES Your body goes through many changes during pregnancy. The changes vary from woman to woman.   You may gain or lose a couple of pounds at first.  You may feel sick to your stomach (nauseous) and throw up (vomit). If the vomiting is uncontrollable, call your health care provider.  You may tire easily.  You may develop headaches that can be relieved by medicines approved by your health care provider.  You may urinate more often. Painful urination may mean you have a bladder infection.  You may develop heartburn as a result of your pregnancy.  You may develop constipation because certain hormones are causing the muscles that push waste through your intestines to slow down.  You may develop hemorrhoids or swollen, bulging veins (varicose veins).  Your breasts may begin to grow larger and become tender. Your nipples may stick out more, and the tissue that surrounds them (areola) may become darker.  Your gums may bleed and may be sensitive to brushing and flossing.  Dark spots or blotches (chloasma,  mask of pregnancy) may develop on your face. This will likely fade after the baby is born.  Your menstrual periods will stop.  You may have a loss of appetite.  You may develop cravings for certain kinds of food.  You may have changes in your emotions from day to day, such as being excited to be pregnant or being concerned that something may go wrong with the pregnancy and baby.  You may have more vivid and strange dreams.  You may have changes in your hair. These can include thickening of your hair, rapid growth, and changes in texture. Some women also have hair loss during or after pregnancy, or hair that feels dry or thin. Your hair will most likely return to normal after your baby is born. WHAT TO EXPECT AT YOUR PRENATAL VISITS During a routine prenatal visit:  You will be weighed to make sure you and the baby are growing normally.  Your blood pressure will be taken.  Your abdomen will be measured to track your baby's growth.  The fetal heartbeat will be listened to starting around week 10 or 12 of your pregnancy.  Test results from any previous visits will be discussed. Your health care provider may ask you:  How you are feeling.  If you are feeling the baby move.  If you have had any abnormal symptoms, such as leaking fluid, bleeding, severe headaches, or abdominal cramping.  If you are using any tobacco products,   including cigarettes, chewing tobacco, and electronic cigarettes.  If you have any questions. Other tests that may be performed during your first trimester include:  Blood tests to find your blood type and to check for the presence of any previous infections. They will also be used to check for low iron levels (anemia) and Rh antibodies. Later in the pregnancy, blood tests for diabetes will be done along with other tests if problems develop.  Urine tests to check for infections, diabetes, or protein in the urine.  An ultrasound to confirm the proper growth  and development of the baby.  An amniocentesis to check for possible genetic problems.  Fetal screens for spina bifida and Down syndrome.  You may need other tests to make sure you and the baby are doing well.  HIV (human immunodeficiency virus) testing. Routine prenatal testing includes screening for HIV, unless you choose not to have this test. HOME CARE INSTRUCTIONS  Medicines  Follow your health care provider's instructions regarding medicine use. Specific medicines may be either safe or unsafe to take during pregnancy.  Take your prenatal vitamins as directed.  If you develop constipation, try taking a stool softener if your health care provider approves. Diet  Eat regular, well-balanced meals. Choose a variety of foods, such as meat or vegetable-based protein, fish, milk and low-fat dairy products, vegetables, fruits, and whole grain breads and cereals. Your health care provider will help you determine the amount of weight gain that is right for you.  Avoid raw meat and uncooked cheese. These carry germs that can cause birth defects in the baby.  Eating four or five small meals rather than three large meals a day may help relieve nausea and vomiting. If you start to feel nauseous, eating a few soda crackers can be helpful. Drinking liquids between meals instead of during meals also seems to help nausea and vomiting.  If you develop constipation, eat more high-fiber foods, such as fresh vegetables or fruit and whole grains. Drink enough fluids to keep your urine clear or pale yellow. Activity and Exercise  Exercise only as directed by your health care provider. Exercising will help you:  Control your weight.  Stay in shape.  Be prepared for labor and delivery.  Experiencing pain or cramping in the lower abdomen or low back is a good sign that you should stop exercising. Check with your health care provider before continuing normal exercises.  Try to avoid standing for long  periods of time. Move your legs often if you must stand in one place for a long time.  Avoid heavy lifting.  Wear low-heeled shoes, and practice good posture.  You may continue to have sex unless your health care provider directs you otherwise. Relief of Pain or Discomfort  Wear a good support bra for breast tenderness.   Take warm sitz baths to soothe any pain or discomfort caused by hemorrhoids. Use hemorrhoid cream if your health care provider approves.   Rest with your legs elevated if you have leg cramps or low back pain.  If you develop varicose veins in your legs, wear support hose. Elevate your feet for 15 minutes, 3-4 times a day. Limit salt in your diet. Prenatal Care  Schedule your prenatal visits by the twelfth week of pregnancy. They are usually scheduled monthly at first, then more often in the last 2 months before delivery.  Write down your questions. Take them to your prenatal visits.  Keep all your prenatal visits as directed by your   health care provider. Safety  Wear your seat belt at all times when driving.  Make a list of emergency phone numbers, including numbers for family, friends, the hospital, and police and fire departments. General Tips  Ask your health care provider for a referral to a local prenatal education class. Begin classes no later than at the beginning of month 6 of your pregnancy.  Ask for help if you have counseling or nutritional needs during pregnancy. Your health care provider can offer advice or refer you to specialists for help with various needs.  Do not use hot tubs, steam rooms, or saunas.  Do not douche or use tampons or scented sanitary pads.  Do not cross your legs for long periods of time.  Avoid cat litter boxes and soil used by cats. These carry germs that can cause birth defects in the baby and possibly loss of the fetus by miscarriage or stillbirth.  Avoid all smoking, herbs, alcohol, and medicines not prescribed by  your health care provider. Chemicals in these affect the formation and growth of the baby.  Do not use any tobacco products, including cigarettes, chewing tobacco, and electronic cigarettes. If you need help quitting, ask your health care provider. You may receive counseling support and other resources to help you quit.  Schedule a dentist appointment. At home, brush your teeth with a soft toothbrush and be gentle when you floss. SEEK MEDICAL CARE IF:   You have dizziness.  You have mild pelvic cramps, pelvic pressure, or nagging pain in the abdominal area.  You have persistent nausea, vomiting, or diarrhea.  You have a bad smelling vaginal discharge.  You have pain with urination.  You notice increased swelling in your face, hands, legs, or ankles. SEEK IMMEDIATE MEDICAL CARE IF:   You have a fever.  You are leaking fluid from your vagina.  You have spotting or bleeding from your vagina.  You have severe abdominal cramping or pain.  You have rapid weight gain or loss.  You vomit blood or material that looks like coffee grounds.  You are exposed to German measles and have never had them.  You are exposed to fifth disease or chickenpox.  You develop a severe headache.  You have shortness of breath.  You have any kind of trauma, such as from a fall or a car accident.   This information is not intended to replace advice given to you by your health care provider. Make sure you discuss any questions you have with your health care provider.   Document Released: 04/24/2001 Document Revised: 05/21/2014 Document Reviewed: 03/10/2013 Elsevier Interactive Patient Education 2016 [employer]  

## 2016-01-23 NOTE — Progress Notes (Signed)
  Subjective:    Barbara Todd is a [id number] 572w1d being seen today for her first obstetrical visit.  Her obstetrical history is significant for h/o molar pregnancy 08/2014. Patient does intend to breast feed. Pregnancy history fully reviewed.  Patient reports no complaints.  Vitals:   01/23/16 0837  BP: 97/76  Pulse: 87  Temp: 98.3 F (36.8 C)  Weight: 150 lb (68 kg)    HISTORY: OB History  Gravida Para Term Preterm AB Living  6 3 3   1 3   SAB TAB Ectopic Multiple Live Births    1     3    # Outcome Date GA Lbr Len/2nd Weight Sex Delivery Anes PTL Lv  6 Current           5 TAB 2015          4 Term 2012    F CS-LTranv   LIV  3 Term 2008    F CS-LTranv   LIV  2 Term 2005    F CS-LTranv   LIV  1 Gravida              Past Medical History:  Diagnosis Date  . Medical history non-contributory    Past Surgical History:  Procedure Laterality Date  . CESAREAN SECTION    . DILATION AND EVACUATION N/A 08/30/2014   Procedure: DILATATION AND EVACUATION;  Surgeon: Barbara AntiguaPeggy Constant, MD;  Location: [facility];  Service: Gynecology;  Laterality: N/A;   No family history on file.   Exam    Uterus:     Pelvic Exam:    Perineum: No Hemorrhoids   Vulva: normal   Vagina:  normal mucosa   pH:     Cervix: no lesions   Adnexa: normal adnexa   Bony Pelvis: average  System: Breast:  normal appearance, no masses or tenderness   Skin: normal coloration and turgor, no rashes    Neurologic: oriented, normal mood   Extremities: normal strength, tone, and muscle mass   HEENT neck supple with midline trachea   Mouth/Teeth dental hygiene good   Neck supple   Cardiovascular: regular rate and rhythm   Respiratory:  appears well, vitals normal, no respiratory distress, acyanotic, normal RR, chest clear, no wheezing, crepitations, rhonchi, normal symmetric air entry   Abdomen: soft, non-tender; bowel sounds normal; no masses,  no organomegaly   Urinary: urethral meatus normal      Assessment:     Pregnancy: [id number] Patient Active Problem List   Diagnosis Date Noted  . 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 Molar pregnancy     First trimester gestation 572w1d     Plan:     Initial labs drawn. Prenatal vitamins. Problem list reviewed and updated. Genetic Screening discussed First Screen: requested.  Ultrasound discussed; fetal survey: requested.  Follow up in 4 weeks. 50% of 30 min visit spent on counseling and coordination of care.  632w1d Repeat CS at 39 weeks   Barbara Todd 01/23/2016

## 2016-01-25 LAB — GC/CHLAMYDIA PROBE AMP
Chlamydia trachomatis, NAA: NEGATIVE
Neisseria gonorrhoeae by PCR: NEGATIVE

## 2016-01-25 LAB — URINE CULTURE, OB REFLEX: ORGANISM ID, BACTERIA: NO GROWTH

## 2016-01-25 LAB — CULTURE, OB URINE

## 2016-01-29 LAB — HIV ANTIBODY (ROUTINE TESTING W REFLEX): HIV SCREEN 4TH GENERATION: NONREACTIVE

## 2016-01-29 LAB — HEMOGLOBINOPATHY EVALUATION
HEMOGLOBIN F QUANTITATION: 0 % (ref 0.0–2.0)
HGB C: 0 %
HGB S: 0 %
Hemoglobin A2 Quantitation: 2.4 % (ref 0.7–3.1)
Hgb A: 97.6 % (ref 94.0–98.0)

## 2016-01-29 LAB — PRENATAL PROFILE I(LABCORP)
ANTIBODY SCREEN: NEGATIVE
Basophils Absolute: 0 10*3/uL (ref 0.0–0.2)
Basos: 0 %
EOS (ABSOLUTE): 0.3 10*3/uL (ref 0.0–0.4)
EOS: 5 %
Hematocrit: 36.1 % (ref 34.0–46.6)
Hemoglobin: 12.2 g/dL (ref 11.1–15.9)
Hepatitis B Surface Ag: NEGATIVE
IMMATURE GRANS (ABS): 0 10*3/uL (ref 0.0–0.1)
Immature Granulocytes: 0 %
LYMPHS: 36 %
Lymphocytes Absolute: 1.9 10*3/uL (ref 0.7–3.1)
MCH: 30.3 pg (ref 26.6–33.0)
MCHC: 33.8 g/dL (ref 31.5–35.7)
MCV: 90 fL (ref 79–97)
MONOCYTES: 6 %
Monocytes Absolute: 0.3 10*3/uL (ref 0.1–0.9)
Neutrophils Absolute: 2.8 10*3/uL (ref 1.4–7.0)
Neutrophils: 53 %
PLATELETS: 242 10*3/uL (ref 150–379)
RBC: 4.03 x10E6/uL (ref 3.77–5.28)
RDW: 13.6 % (ref 12.3–15.4)
RH TYPE: POSITIVE
RPR Ser Ql: NONREACTIVE
RUBELLA: 10.2 {index} (ref >0.99)
WBC: 5.3 10*3/uL (ref 3.4–10.8)

## 2016-01-29 LAB — TOXASSURE SELECT 13 (MW), URINE

## 2016-02-03 ENCOUNTER — Encounter (HOSPITAL_COMMUNITY): Payer: Self-pay

## 2016-02-03 ENCOUNTER — Inpatient Hospital Stay (HOSPITAL_COMMUNITY)
Admission: AD | Admit: 2016-02-03 | Discharge: 2016-02-03 | Disposition: A | Discharge status: Home / Self Care | Payer: Medicaid Other | Source: Ambulatory Visit | Attending: Obstetrics and Gynecology | Admitting: Obstetrics and Gynecology

## 2016-02-03 DIAGNOSIS — Z3A11 11 weeks gestation of pregnancy: Secondary | ICD-10-CM | POA: Insufficient documentation

## 2016-02-03 DIAGNOSIS — N760 Acute vaginitis: Secondary | ICD-10-CM

## 2016-02-03 DIAGNOSIS — A499 Bacterial infection, unspecified: Secondary | ICD-10-CM | POA: Diagnosis not present

## 2016-02-03 DIAGNOSIS — O23591 Infection of other part of genital tract in pregnancy, first trimester: Secondary | ICD-10-CM | POA: Diagnosis not present

## 2016-02-03 DIAGNOSIS — N898 Other specified noninflammatory disorders of vagina: Secondary | ICD-10-CM | POA: Diagnosis present

## 2016-02-03 DIAGNOSIS — B9689 Other specified bacterial agents as the cause of diseases classified elsewhere: Secondary | ICD-10-CM

## 2016-02-03 LAB — WET PREP, GENITAL
Sperm: NONE SEEN
TRICH WET PREP: NONE SEEN
Yeast Wet Prep HPF POC: NONE SEEN

## 2016-02-03 MED ORDER — METRONIDAZOLE 500 MG PO TABS: 500.0000 mg | ORAL_TABLET | Freq: Two times a day (BID) | ORAL | 0 refills | Status: DC

## 2016-02-03 NOTE — MAU Note (Signed)
Pt presents to MAU with vaginal discharge. Reports a small amount of thin discharge that is orange with an odor. Began yesterday. Denies urinary symtoms

## 2016-02-03 NOTE — MAU Provider Note (Signed)
History     CSN: [id number]  Arrival date and time: 02/03/16 1538   None     Chief Complaint  Patient presents with  . Vaginal Discharge   HPI Pt is 75 y.W.[id number] [redacted]w[redacted]d who presents with orange colored vaginal discharge that she noticed yesterday- concerned about everything being OK.  Pt denies any spotting or bleeding or cramping.  Pt has confirmed viable IUP with [place] at 7 w 2d GA on 01/03/2016. Pt denies UTI sx, constipation, diarrhea or chills/fever.  Pt had IC 3 days ago without any pain or bleeding. RN note: [] Hover for attribution information Pt presents to MAU with vaginal discharge. Reports a small amount of thin discharge that is orange with an odor. Began yesterday. Denies urinary symtoms  Past Medical History:  Diagnosis Date  . Medical history non-contributory     Past Surgical History:  Procedure Laterality Date  . CESAREAN SECTION    . DILATION AND EVACUATION N/A 08/30/2014   Procedure: DILATATION AND EVACUATION;  Surgeon: Catalina Antigua, MD;  Location: [facility];  Service: Gynecology;  Laterality: N/A;    No family history on file.  Social History  Substance Use Topics  . Smoking status: Never Smoker  . Smokeless tobacco: Never Used  . Alcohol use No    Allergies: No Known Allergies  Prescriptions Prior to Admission  Medication Sig Dispense Refill Last Dose  . docusate sodium (COLACE) 100 MG capsule Take 1 capsule (100 mg total) by mouth 2 (two) times daily as needed. (Patient not taking: Reported on 01/23/2016) 30 capsule 2 Not Taking  . oxyCODONE-acetaminophen (PERCOCET/ROXICET) 5-325 MG per tablet Take 1 tablet by mouth every 4 (four) hours as needed. (Patient not taking: Reported on 09/17/2014) 20 tablet 0 Not Taking  . Prenatal Vit-Fe Fumarate-FA (PRENATAL MULTIVITAMIN) TABS tablet Take 1 tablet by mouth daily at 12 noon.    Taking    ROS Physical Exam   Blood pressure 109/66, pulse 95, temperature 98.2 F (36.8 C), temperature source Oral, resp.  rate 18, last menstrual period 10/27/2015, unknown if currently breastfeeding.  Physical Exam  Nursing note and vitals reviewed. Constitutional: She is oriented to person, place, and time. She appears well-developed and well-nourished. No distress.  HENT:  Head: Normocephalic.  Eyes: Pupils are equal, round, and reactive to light.  Neck: Normal range of motion. Neck supple.  Cardiovascular: Normal rate.   Respiratory: Effort normal.  GI: Soft. She exhibits no distension. There is no tenderness. There is no rebound.  Genitourinary:  Genitourinary Comments: Small amount of pink tinged clear discharge in vault; cervix closed, NT  Musculoskeletal: Normal range of motion.  Neurological: She is alert and oriented to person, place, and time.  Skin: Skin is warm and dry.  Psychiatric: She has a normal mood and affect.    MAU Course  Procedures Results for orders placed or performed during the hospital encounter of 02/03/16 (from the past 24 hour(s))  Wet prep, genital     Status: Abnormal   Collection Time: 02/03/16  4:30 PM  Result Value Ref Range   Yeast Wet Prep HPF POC NONE SEEN NONE SEEN   Trich, Wet Prep NONE SEEN NONE SEEN   Clue Cells Wet Prep HPF POC PRESENT (A) NONE SEEN   WBC, Wet Prep HPF POC FEW (A) NONE SEEN   Sperm NONE SEEN      Assessment and Plan  Bacterial vaginosis- Flagyl 500mg  BID for 7 days Pregnancy [redacted]w[redacted]d- f/u with OB appointment Return for pain,  spotting or concerns  Romualdo Prosise 02/03/2016, 4:25 PM

## 2016-02-03 NOTE — Discharge Instructions (Signed)

## 2016-02-03 NOTE — MAU Note (Signed)
Urine in lab 

## 2016-02-07 ENCOUNTER — Inpatient Hospital Stay (HOSPITAL_COMMUNITY): Payer: Medicaid Other

## 2016-02-07 ENCOUNTER — Encounter (HOSPITAL_COMMUNITY): Payer: Self-pay | Admitting: *Deleted

## 2016-02-07 ENCOUNTER — Ambulatory Visit (HOSPITAL_COMMUNITY): Admission: RE | Admit: 2016-02-07 | Payer: Medicaid Other | Source: Ambulatory Visit

## 2016-02-07 ENCOUNTER — Ambulatory Visit (HOSPITAL_COMMUNITY): Payer: Medicaid Other

## 2016-02-07 ENCOUNTER — Inpatient Hospital Stay (HOSPITAL_COMMUNITY)
Admission: AD | Admit: 2016-02-07 | Discharge: 2016-02-07 | Disposition: A | Discharge status: Home / Self Care | Payer: Medicaid Other | Source: Ambulatory Visit | Attending: Family Medicine | Admitting: Family Medicine

## 2016-02-07 DIAGNOSIS — Z3A12 12 weeks gestation of pregnancy: Secondary | ICD-10-CM | POA: Insufficient documentation

## 2016-02-07 DIAGNOSIS — O021 Missed abortion: Secondary | ICD-10-CM

## 2016-02-07 DIAGNOSIS — O209 Hemorrhage in early pregnancy, unspecified: Secondary | ICD-10-CM

## 2016-02-07 DIAGNOSIS — IMO0002 Reserved for concepts with insufficient information to code with codable children: Secondary | ICD-10-CM

## 2016-02-07 LAB — URINE MICROSCOPIC-ADD ON: WBC, UA: NONE SEEN WBC/hpf (ref 0–5)

## 2016-02-07 LAB — URINALYSIS, ROUTINE W REFLEX MICROSCOPIC
BILIRUBIN URINE: NEGATIVE
Glucose, UA: NEGATIVE mg/dL
KETONES UR: NEGATIVE mg/dL
Leukocytes, UA: NEGATIVE
Nitrite: NEGATIVE
PROTEIN: NEGATIVE mg/dL
Specific Gravity, Urine: <1.005 — ABNORMAL LOW (ref 1.005–1.030)
pH: 7 (ref 5.0–8.0)

## 2016-02-07 NOTE — MAU Provider Note (Signed)
History     CSN: [id number]  Arrival date and time: 02/07/16 1154   First Provider Initiated Contact with Patient 02/07/16 1302      Chief Complaint  Patient presents with  . Vaginal Bleeding   HPI  Ane PaymentFrances Hinely is a 35 y.o. [id number] at 2763w2d by first trimester ultrasound who presents with vaginal bleeding. Reports vaginal spotting since Sunday and increased bleeding this morning. Reports that the bleeding is more like a period today. Not saturating pads and no clots. Denies abdominal pain. No recent intercourse.   OB History    Gravida Para Term Preterm AB Living   6 3 3   2 3    SAB TAB Ectopic Multiple Live Births   1 1     3       Past Medical History:  Diagnosis Date  . Medical history non-contributory     Past Surgical History:  Procedure Laterality Date  . CESAREAN SECTION    . DILATION AND EVACUATION N/A 08/30/2014   Procedure: DILATATION AND EVACUATION;  Surgeon: Catalina AntiguaPeggy Constant, MD;  Location: [facility];  Service: Gynecology;  Laterality: N/A;    History reviewed. No pertinent family history.  Social History  Substance Use Topics  . Smoking status: Never Smoker  . Smokeless tobacco: Never Used  . Alcohol use No    Allergies: No Known Allergies  Prescriptions Prior to Admission  Medication Sig Dispense Refill Last Dose  . metroNIDAZOLE (FLAGYL) 500 MG tablet Take 1 tablet (500 mg total) by mouth 2 (two) times daily. 14 tablet 0   . Prenatal Vit-Fe Fumarate-FA (PRENATAL MULTIVITAMIN) TABS tablet Take 1 tablet by mouth daily at 12 noon.    Taking    Review of Systems  Constitutional: Negative for chills and fever.  Gastrointestinal: Negative.   Genitourinary:       + vaginal bleeding   Physical Exam   Blood pressure 105/67, pulse 80, temperature 98.4 F (36.9 C), temperature source Oral, resp. rate 16, last menstrual period 10/27/2015, unknown if currently breastfeeding.  Physical Exam  Nursing note and vitals reviewed. Constitutional: She is oriented  to person, place, and time. She appears well-developed and well-nourished. No distress.  HENT:  Head: Normocephalic and atraumatic.  Eyes: Conjunctivae are normal. Right eye exhibits no discharge. Left eye exhibits no discharge. No scleral icterus.  Neck: Normal range of motion.  Cardiovascular: Normal rate, regular rhythm and normal heart sounds.   No murmur heard. Respiratory: Effort normal and breath sounds normal. No respiratory distress. She has no wheezes.  GI: Soft. There is no tenderness.  Genitourinary:  Genitourinary Comments: Small amount of dark red blood. No active bleeding. Cervix closed.   Neurological: She is alert and oriented to person, place, and time.  Skin: Skin is warm and dry. She is not diaphoretic.  Psychiatric: She has a normal mood and affect. Her behavior is normal. Judgment and thought content normal.    MAU Course  Procedures Results for orders placed or performed during the hospital encounter of 02/07/16 (from the past 24 hour(s))  Urinalysis, Routine w reflex microscopic (not at [facility])     Status: Abnormal   Collection Time: 02/07/16 12:05 PM  Result Value Ref Range   Color, Urine YELLOW YELLOW   APPearance CLEAR CLEAR   Specific Gravity, Urine <1.005 (L) 1.005 - 1.030   pH 7.0 5.0 - 8.0   Glucose, UA NEGATIVE NEGATIVE mg/dL   Hgb urine dipstick LARGE (A) NEGATIVE   Bilirubin Urine NEGATIVE NEGATIVE  Ketones, ur NEGATIVE NEGATIVE mg/dL   Protein, ur NEGATIVE NEGATIVE mg/dL   Nitrite NEGATIVE NEGATIVE   Leukocytes, UA NEGATIVE NEGATIVE  Urine microscopic-add on     Status: Abnormal   Collection Time: 02/07/16 12:05 PM  Result Value Ref Range   Squamous Epithelial / LPF 0-5 (A) NONE SEEN   WBC, UA NONE SEEN 0 - 5 WBC/hpf   RBC / HPF 0-5 0 - 5 RBC/hpf   Bacteria, UA RARE (A) NONE SEEN   US Ob Comp Less 14 Wks  Result Date: 02/07/2016 CLINICAL DATA:  Vaginal bleeding.  No fetal heart tones identified. EXAM: OBSTETRIC <14 WK ULTRASOUND TECHNIQUE:  Transabdominal ultrasound was performed for evaluation of the gestation as well as the maternal uterus and adnexal regions. COMPARISON:  None. FINDINGS: Intrauterine gestational sac: A single gestational sac is identified. Yolk sac:  No yolk sac identified. Embryo:  A fetal pole is seen. Cardiac Activity: No cardiac activity identified. Heart Rate: 0 bpm MSD:   mm    w     d CRL:   1.75 cm 8 w 1 d                  [place] [facility]: Sep 17, 2016 Subchorionic hemorrhage:  None visualized. Maternal uterus/adnexae: The right ovary is normal in appearance. The left ovary was not visualized. IMPRESSION: 1. A single intrauterine pregnancy is identified. The fetal pole correlates with a gestational age of [redacted] weeks and 1 day. However, no fetal heart tones are identified. Electronically Signed   By: Gerome Sam III M.D   On: 02/07/2016 13:57     MDM Unable to hear FHT with doppler by RN & myself Ultrasound shows IUP measuring [redacted]w[redacted]d with no cardiac activity Discussed results with patient -- pt appropriately upset -- chaplain called to bedside Discussed options for management of incomplete AB including expectant management, Cytotec or D&C. Prefers expectant management at this time. Verbalizes understanding that intervention may become necessary if SAB in not completed spontaneously or if heavy bleeding or infection occur.   Assessment and Plan  A: 1. Missed abortion   2. Fetal heart tones not heard   3. Vaginal bleeding in pregnancy, first trimester     P; Discharge home Pt to keep f/u appt she has at [facility] 10/9 Discussed reasons to return to MAU Comfort care pack given by RN  Judeth Horn 02/07/2016, 1:02 PM

## 2016-02-07 NOTE — Progress Notes (Signed)
Urine walked to lab @1218 

## 2016-02-07 NOTE — Progress Notes (Signed)
I offered grief support to Scarlette CalicoFrances as the emotions came in waves of intensity.  She spoke briefly about her previous loss and her D/E that she had with that loss. She was very tearful as she processed that she was experiencing another loss.  I gave her my card for follow-up support.  Chaplain Dyanne CarrelKaty Rashaud Ybarbo, Bcc Pager, [phone] 3:51 PM    02/07/16 1500  Clinical Encounter Type  Visited With Patient  Visit Type Initial;Spiritual support  Referral From Nurse  Spiritual Encounters  Spiritual Needs Emotional;Grief support  Stress Factors  Patient Stress Factors Loss (Perinatal loss)

## 2016-02-07 NOTE — Discharge Instructions (Signed)
Miscarriage  A miscarriage is the sudden loss of an unborn baby (fetus) before the 20th week of pregnancy. Most miscarriages happen in the first 3 months of pregnancy. Sometimes, it happens before a woman even knows she is pregnant. A miscarriage is also called a "spontaneous miscarriage" or "early pregnancy loss." Having a miscarriage can be an emotional experience. Talk with your caregiver about any questions you may have about miscarrying, the grieving process, and your future pregnancy plans.  CAUSES    Problems with the fetal chromosomes that make it impossible for the baby to develop normally. Problems with the baby's genes or chromosomes are most often the result of errors that occur, by chance, as the embryo divides and grows. The problems are not inherited from the parents.   Infection of the cervix or uterus.    Hormone problems.    Problems with the cervix, such as having an incompetent cervix. This is when the tissue in the cervix is not strong enough to hold the pregnancy.    Problems with the uterus, such as an abnormally shaped uterus, uterine fibroids, or congenital abnormalities.    Certain medical conditions.    Smoking, drinking alcohol, or taking illegal drugs.    Trauma.   Often, the cause of a miscarriage is unknown.   SYMPTOMS    Vaginal bleeding or spotting, with or without cramps or pain.   Pain or cramping in the abdomen or lower back.   Passing fluid, tissue, or blood clots from the vagina.  DIAGNOSIS   Your caregiver will perform a physical exam. You may also have an ultrasound to confirm the miscarriage. Blood or urine tests may also be ordered.  TREATMENT    Sometimes, treatment is not necessary if you naturally pass all the fetal tissue that was in the uterus. If some of the fetus or placenta remains in the body (incomplete miscarriage), tissue left behind may become infected and must be removed. Usually, a dilation and curettage (D and C) procedure is performed.  During a D and C procedure, the cervix is widened (dilated) and any remaining fetal or placental tissue is gently removed from the uterus.   Antibiotic medicines are prescribed if there is an infection. Other medicines may be given to reduce the size of the uterus (contract) if there is a lot of bleeding.   If you have Rh negative blood and your baby was Rh positive, you will need a Rh immunoglobulin shot. This shot will protect any future baby from having Rh blood problems in future pregnancies.  HOME CARE INSTRUCTIONS    Your caregiver may order bed rest or may allow you to continue light activity. Resume activity as directed by your caregiver.   Have someone help with home and family responsibilities during this time.    Keep track of the number of sanitary pads you use each day and how soaked (saturated) they are. Write down this information.    Do not use tampons. Do not douche or have sexual intercourse until approved by your caregiver.    Only take over-the-counter or prescription medicines for pain or discomfort as directed by your caregiver.    Do not take aspirin. Aspirin can cause bleeding.    Keep all follow-up appointments with your caregiver.    If you or your partner have problems with grieving, talk to your caregiver or seek counseling to help cope with the pregnancy loss. Allow enough time to grieve before trying to get pregnant again.     SEEK IMMEDIATE MEDICAL CARE IF:    You have severe cramps or pain in your back or abdomen.   You have a fever.   You pass large blood clots (walnut-sized or larger) ortissue from your vagina. Save any tissue for your caregiver to inspect.    Your bleeding increases.    You have a thick, bad-smelling vaginal discharge.   You become lightheaded, weak, or you faint.    You have chills.   MAKE SURE YOU:   Understand these instructions.   Will watch your condition.   Will get help right away if you are not doing well or get worse.     This  information is not intended to replace advice given to you by your health care provider. Make sure you discuss any questions you have with your health care provider.     Document Released: 10/24/2000 Document Revised: 08/25/2012 Document Reviewed: 06/19/2011  Elsevier Interactive Patient Education 2016 [employer]vier Inc[employer]

## 2016-02-07 NOTE — MAU Note (Signed)
Was here last week, was having some orange d/c.  Was given a prescription.  Started having some bleeding yesterday. Has an appointment today.  Was getting ready for that, noted some more bleeding, got scared, "has happened to her before".

## 2016-02-09 ENCOUNTER — Encounter (HOSPITAL_COMMUNITY): Payer: Self-pay

## 2016-02-09 ENCOUNTER — Inpatient Hospital Stay (HOSPITAL_COMMUNITY)
Admission: AD | Admit: 2016-02-09 | Discharge: 2016-02-10 | Disposition: A | Discharge status: Home / Self Care | Payer: Medicaid Other | Source: Ambulatory Visit | Attending: Obstetrics & Gynecology | Admitting: Obstetrics & Gynecology

## 2016-02-09 DIAGNOSIS — O021 Missed abortion: Secondary | ICD-10-CM | POA: Insufficient documentation

## 2016-02-09 DIAGNOSIS — R102 Pelvic and perineal pain: Secondary | ICD-10-CM | POA: Diagnosis present

## 2016-02-09 MED ORDER — OXYCODONE-ACETAMINOPHEN 5-325 MG PO TABS
2.0000 | ORAL_TABLET | Freq: Once | ORAL | Status: AC
  Administered 2016-02-09: 2 via ORAL
  Filled 2016-02-09: qty 2

## 2016-02-09 NOTE — MAU Note (Signed)
Pt c/o lower abdominal pain and vag bleeding when wiping that started tonight. Has known SAB

## 2016-02-09 NOTE — MAU Provider Note (Signed)
  History     CSN: [id number]  Arrival date and time: 02/09/16 2250   First Provider Initiated Contact with Patient 02/09/16 2327      Chief Complaint  Patient presents with  . Pelvic Pain   Pelvic Pain  The patient's primary symptoms include pelvic pain. This is a new problem. The current episode started today. The problem occurs intermittently. The problem has been unchanged. Pain severity now: 10/10. The problem affects both sides. She is pregnant (current MAB). Associated symptoms include abdominal pain. Pertinent negatives include no chills, constipation, diarrhea, dysuria, fever, frequency, nausea, urgency or vomiting. The vaginal discharge was normal. The vaginal bleeding is spotting. Nothing aggravates the symptoms. She has tried acetaminophen for the symptoms. The treatment provided no relief.    Past Medical History:  Diagnosis Date  . Medical history non-contributory     Past Surgical History:  Procedure Laterality Date  . CESAREAN SECTION    . DILATION AND EVACUATION N/A 08/30/2014   Procedure: DILATATION AND EVACUATION;  Surgeon: Catalina AntiguaPeggy Constant, MD;  Location: [facility];  Service: Gynecology;  Laterality: N/A;    No family history on file.  Social History  Substance Use Topics  . Smoking status: Never Smoker  . Smokeless tobacco: Never Used  . Alcohol use No    Allergies: No Known Allergies  Prescriptions Prior to Admission  Medication Sig Dispense Refill Last Dose  . metroNIDAZOLE (FLAGYL) 500 MG tablet Take 1 tablet (500 mg total) by mouth 2 (two) times daily. 14 tablet 0 02/07/2016 at Unknown time  . Prenatal Vit-Fe Fumarate-FA (PRENATAL MULTIVITAMIN) TABS tablet Take 1 tablet by mouth daily at 12 noon.    02/06/2016 at Unknown time    Review of Systems  Constitutional: Negative for chills and fever.  Gastrointestinal: Positive for abdominal pain. Negative for constipation, diarrhea, nausea and vomiting.  Genitourinary: Positive for pelvic pain. Negative for  dysuria, frequency and urgency.   Physical Exam   Blood pressure 120/72, pulse 90, temperature 97.6 F (36.4 C), temperature source Oral, resp. rate 20, last menstrual period 10/27/2015, SpO2 100 %, unknown if currently breastfeeding.  Physical Exam  Nursing note and vitals reviewed. Constitutional: She is oriented to person, place, and time. She appears well-developed and well-nourished. No distress.  HENT:  Head: Normocephalic.  Cardiovascular: Normal rate.   Respiratory: Effort normal.  GI: Soft. There is no tenderness. There is no rebound.  Genitourinary:  Genitourinary Comments:  External: no lesion Vagina: small amount of bleeding seen  Cervix: pink, smooth, no CMT, FT/softening     Neurological: She is alert and oriented to person, place, and time.  Skin: Skin is warm and dry.  Psychiatric: She has a normal mood and affect.    MAU Course  Procedures  MDM Patient has had percocet. She reports significant improvement in her pain.   Assessment and Plan   1. Abortion, missed    DC home Comfort measures reviewed  Anticipatory guidance on SAB Bleeding precautions RX: Vicodin PRN #20, phenergan PRN #30, ibuprofen PRN #20  Return to MAU as needed FU with OB as planned  Follow-up Information    [facility]   Specialty:  Obstetrics and Gynecology Contact information: [address] [place] [phone]           Tawnya CrookHogan, Wealthy Danielski Donovan 02/09/2016, 11:28 PM

## 2016-02-10 DIAGNOSIS — O021 Missed abortion: Secondary | ICD-10-CM

## 2016-02-10 MED ORDER — PROMETHAZINE HCL 25 MG PO TABS: 12.5000 mg | ORAL_TABLET | Freq: Four times a day (QID) | ORAL | 0 refills | Status: DC | PRN

## 2016-02-10 MED ORDER — IBUPROFEN 800 MG PO TABS: 800.0000 mg | ORAL_TABLET | Freq: Three times a day (TID) | ORAL | 0 refills | Status: DC | PRN

## 2016-02-10 MED ORDER — HYDROCODONE-ACETAMINOPHEN 5-325 MG PO TABS: 1.0000 | ORAL_TABLET | ORAL | 0 refills | Status: DC | PRN

## 2016-02-10 NOTE — Discharge Instructions (Signed)
Miscarriage  A miscarriage is the sudden loss of an unborn baby (fetus) before the 20th week of pregnancy. Most miscarriages happen in the first 3 months of pregnancy. Sometimes, it happens before a woman even knows she is pregnant. A miscarriage is also called a "spontaneous miscarriage" or "early pregnancy loss." Having a miscarriage can be an emotional experience. Talk with your caregiver about any questions you may have about miscarrying, the grieving process, and your future pregnancy plans.  CAUSES    Problems with the fetal chromosomes that make it impossible for the baby to develop normally. Problems with the baby's genes or chromosomes are most often the result of errors that occur, by chance, as the embryo divides and grows. The problems are not inherited from the parents.   Infection of the cervix or uterus.    Hormone problems.    Problems with the cervix, such as having an incompetent cervix. This is when the tissue in the cervix is not strong enough to hold the pregnancy.    Problems with the uterus, such as an abnormally shaped uterus, uterine fibroids, or congenital abnormalities.    Certain medical conditions.    Smoking, drinking alcohol, or taking illegal drugs.    Trauma.   Often, the cause of a miscarriage is unknown.   SYMPTOMS    Vaginal bleeding or spotting, with or without cramps or pain.   Pain or cramping in the abdomen or lower back.   Passing fluid, tissue, or blood clots from the vagina.  DIAGNOSIS   Your caregiver will perform a physical exam. You may also have an ultrasound to confirm the miscarriage. Blood or urine tests may also be ordered.  TREATMENT    Sometimes, treatment is not necessary if you naturally pass all the fetal tissue that was in the uterus. If some of the fetus or placenta remains in the body (incomplete miscarriage), tissue left behind may become infected and must be removed. Usually, a dilation and curettage (D and C) procedure is performed.  During a D and C procedure, the cervix is widened (dilated) and any remaining fetal or placental tissue is gently removed from the uterus.   Antibiotic medicines are prescribed if there is an infection. Other medicines may be given to reduce the size of the uterus (contract) if there is a lot of bleeding.   If you have Rh negative blood and your baby was Rh positive, you will need a Rh immunoglobulin shot. This shot will protect any future baby from having Rh blood problems in future pregnancies.  HOME CARE INSTRUCTIONS    Your caregiver may order bed rest or may allow you to continue light activity. Resume activity as directed by your caregiver.   Have someone help with home and family responsibilities during this time.    Keep track of the number of sanitary pads you use each day and how soaked (saturated) they are. Write down this information.    Do not use tampons. Do not douche or have sexual intercourse until approved by your caregiver.    Only take over-the-counter or prescription medicines for pain or discomfort as directed by your caregiver.    Do not take aspirin. Aspirin can cause bleeding.    Keep all follow-up appointments with your caregiver.    If you or your partner have problems with grieving, talk to your caregiver or seek counseling to help cope with the pregnancy loss. Allow enough time to grieve before trying to get pregnant again.     SEEK IMMEDIATE MEDICAL CARE IF:    You have severe cramps or pain in your back or abdomen.   You have a fever.   You pass large blood clots (walnut-sized or larger) ortissue from your vagina. Save any tissue for your caregiver to inspect.    Your bleeding increases.    You have a thick, bad-smelling vaginal discharge.   You become lightheaded, weak, or you faint.    You have chills.   MAKE SURE YOU:   Understand these instructions.   Will watch your condition.   Will get help right away if you are not doing well or get worse.     This  information is not intended to replace advice given to you by your health care provider. Make sure you discuss any questions you have with your health care provider.     Document Released: 10/24/2000 Document Revised: 08/25/2012 Document Reviewed: 06/19/2011  Elsevier Interactive Patient Education 2016 [employer]vier Inc[employer]

## 2016-02-20 ENCOUNTER — Ambulatory Visit (INDEPENDENT_AMBULATORY_CARE_PROVIDER_SITE_OTHER): Payer: Commercial Managed Care - PPO | Admitting: Obstetrics and Gynecology

## 2016-02-20 ENCOUNTER — Encounter: Payer: Self-pay | Admitting: Obstetrics and Gynecology

## 2016-02-20 VITALS — BP 124/68 | HR 101 | Ht 62.0 in

## 2016-02-20 DIAGNOSIS — O039 Complete or unspecified spontaneous abortion without complication: Secondary | ICD-10-CM | POA: Diagnosis not present

## 2016-02-20 NOTE — Progress Notes (Signed)
   Subjective:    Patient ID: Barbara Todd, female    DOB: 1981/02/06, 35 y.o.   MRN: [id number]  HPI 35 yo [id number] presenting today as an MAU follow up. Patient was diagnosed with a spontaneous abortion on 9/26. She reports persistent vaginal bleeding since 9/26. It was heavy like a period but is not reduced to a lighter flow. She reports persistent cramping pain but significantly improved from previous. She states that she still "feels pregnant"  Past Medical History:  Diagnosis Date  . Medical history non-contributory    Past Surgical History:  Procedure Laterality Date  . CESAREAN SECTION    . DILATION AND EVACUATION N/A 08/30/2014   Procedure: DILATATION AND EVACUATION;  Surgeon: Catalina AntiguaPeggy Ching Rabideau, MD;  Location: [facility];  Service: Gynecology;  Laterality: N/A;   History reviewed. No pertinent family history. Social History  Substance Use Topics  . Smoking status: Never Smoker  . Smokeless tobacco: Never Used  . Alcohol use No      Review of Systems See pertinent in HPI    Objective:   Physical Exam  GENERAL: Well-developed, well-nourished female in no acute distress.  ABDOMEN: Soft, nontender, nondistended. No organomegaly. PELVIC: Normal external female genitalia. Vagina is pink and rugated.  Normal discharge. Normal appearing cervix. Uterus is normal in size. No adnexal mass or tenderness. EXTREMITIES: No cyanosis, clubbing, or edema, 2+ distal pulses.     Assessment & Plan:  35 yo with SAB - Quant HCG today and weekly until complete resolution of pregnancy - Patient desires to try to conceive again - Advised to continue prenatal vitamins - Patient will be contacted with results - RTC prn

## 2016-02-20 NOTE — Progress Notes (Signed)
Pt seen at MAU for SAB 02/09/16 - She states she had some vaginal bleeding for about 4 days but never saw blood clots. She states she had "contraction pain" during that time. She also states she still "feels pregnant" and describes being tired and abdominal swelling.

## 2016-02-21 LAB — BETA HCG QUANT (REF LAB): HCG QUANT: 19 m[IU]/mL

## 2016-02-23 ENCOUNTER — Telehealth: Payer: Self-pay | Admitting: *Deleted

## 2016-02-23 NOTE — Telephone Encounter (Signed)
Telephone call to patient per Dr. Jolayne Pantheronstant request to have her come in for repeart BHCG.  Scheduled patient to return on Monday.  Patient requested appointment for her annual.  Scheduled for end of month.

## 2016-02-27 ENCOUNTER — Other Ambulatory Visit: Payer: Medicaid Other

## 2016-02-27 DIAGNOSIS — O039 Complete or unspecified spontaneous abortion without complication: Secondary | ICD-10-CM

## 2016-02-28 ENCOUNTER — Telehealth: Payer: Self-pay | Admitting: *Deleted

## 2016-02-28 LAB — BETA HCG QUANT (REF LAB): HCG QUANT: 5 m[IU]/mL

## 2016-02-28 NOTE — Telephone Encounter (Signed)
Pt made aware of lab results and recommendations from Dr Jolayne Pantheronstant. Pt advised to take prenatal vitamins, monitor for 1 regular cycle before trying to conceive again. Pt states understanding.

## 2016-03-12 ENCOUNTER — Ambulatory Visit (INDEPENDENT_AMBULATORY_CARE_PROVIDER_SITE_OTHER): Payer: Commercial Managed Care - PPO | Admitting: Obstetrics and Gynecology

## 2016-03-12 ENCOUNTER — Encounter: Payer: Self-pay | Admitting: Obstetrics and Gynecology

## 2016-03-12 ENCOUNTER — Other Ambulatory Visit (HOSPITAL_COMMUNITY)
Admission: RE | Admit: 2016-03-12 | Discharge: 2016-03-12 | Disposition: A | Discharge status: Home / Self Care | Payer: Commercial Managed Care - PPO | Source: Ambulatory Visit | Attending: Obstetrics and Gynecology | Admitting: Obstetrics and Gynecology

## 2016-03-12 VITALS — BP 109/69 | HR 71 | Temp 97.6°F | Wt 150.8 lb

## 2016-03-12 DIAGNOSIS — Z124 Encounter for screening for malignant neoplasm of cervix: Secondary | ICD-10-CM

## 2016-03-12 DIAGNOSIS — Z01419 Encounter for gynecological examination (general) (routine) without abnormal findings: Secondary | ICD-10-CM | POA: Diagnosis present

## 2016-03-12 DIAGNOSIS — Z1151 Encounter for screening for human papillomavirus (HPV): Secondary | ICD-10-CM

## 2016-03-12 MED ORDER — BUTALBITAL-APAP-CAFFEINE 50-325-40 MG PO TABS: 1.0000 | ORAL_TABLET | Freq: Four times a day (QID) | ORAL | 0 refills | Status: DC | PRN

## 2016-03-12 MED ORDER — CYCLOBENZAPRINE HCL 10 MG PO TABS: 10.0000 mg | ORAL_TABLET | Freq: Three times a day (TID) | ORAL | 0 refills | Status: DC | PRN

## 2016-03-12 NOTE — Progress Notes (Signed)
Subjective:     Barbara Todd is a 35 y.o. female [phone] who is here for a comprehensive physical exam. The patient reports a headache for the past 3 weeks. It is located on her left side, it gets alleviated by tylenol but never completely resolves  Past Medical History:  Diagnosis Date  . Medical history non-contributory    Past Surgical History:  Procedure Laterality Date  . CESAREAN SECTION    . DILATION AND EVACUATION N/A 08/30/2014   Procedure: DILATATION AND EVACUATION;  Surgeon: Catalina AntiguaPeggy Ennifer Harston, MD;  Location: [facility];  Service: Gynecology;  Laterality: N/A;   No family history on file.  Social History   Social History  . Marital status: Married    Spouse name: N/A  . Number of children: N/A  . Years of education: N/A   Occupational History  . Not on file.   Social History Main Topics  . Smoking status: Never Smoker  . Smokeless tobacco: Never Used  . Alcohol use No  . Drug use: No  . Sexual activity: Yes    Birth control/ protection: None   Other Topics Concern  . Not on file   Social History Narrative  . No narrative on file   Health Maintenance  Topic Date Due  . TETANUS/TDAP  07/25/2001  . PAP SMEAR  07/26/2003  . INFLUENZA VACCINE  12/13/2015  . HIV Screening  Completed       Review of Systems Pertinent items are noted in HPI.   Objective:       GENERAL: Well-developed, well-nourished female in no acute distress.  HEENT: Normocephalic, atraumatic. Sclerae anicteric.  NECK: Supple. Normal thyroid.  LUNGS: Clear to auscultation bilaterally.  HEART: Regular rate and rhythm. BREASTS: Symmetric in size. No palpable masses or lymphadenopathy, skin changes, or nipple drainage. ABDOMEN: Soft, nontender, nondistended. No organomegaly. PELVIC: Normal external female genitalia. Vagina is pink and rugated.  Normal discharge. Normal appearing cervix. Uterus is normal in size. No adnexal mass or tenderness. EXTREMITIES: No cyanosis, clubbing, or edema, 2+  distal pulses.    Assessment:    Healthy female exam.      Plan:    pap smear collected Fasting labs ordered Continue prenatal vitamins Rx Fioricet and Flexeril provided. May need referral to headache specialist if no improvement Patient will be contacted with any abnormal results See After Visit Summary for Counseling Recommendations

## 2016-03-12 NOTE — Progress Notes (Signed)
Patient is in the office for annual exam. 

## 2016-03-13 LAB — COMPREHENSIVE METABOLIC PANEL
A/G RATIO: 1.3 (ref 1.2–2.2)
ALBUMIN: 4.2 g/dL (ref 3.5–5.5)
ALK PHOS: 63 IU/L (ref 39–117)
ALT: 15 IU/L (ref 0–32)
AST: 20 IU/L (ref 0–40)
BILIRUBIN TOTAL: 0.3 mg/dL (ref 0.0–1.2)
BUN / CREAT RATIO: 16 (ref 9–23)
BUN: 12 mg/dL (ref 6–20)
CHLORIDE: 101 mmol/L (ref 96–106)
CO2: 24 mmol/L (ref 18–29)
Calcium: 10.1 mg/dL (ref 8.7–10.2)
Creatinine, Ser: 0.74 mg/dL (ref 0.57–1.00)
GFR calc non Af Amer: 107 mL/min/{1.73_m2} (ref >59)
GFR, EST AFRICAN AMERICAN: 123 mL/min/{1.73_m2} (ref >59)
GLUCOSE: 82 mg/dL (ref 65–99)
Globulin, Total: 3.3 g/dL (ref 1.5–4.5)
POTASSIUM: 4 mmol/L (ref 3.5–5.2)
Sodium: 140 mmol/L (ref 134–144)
TOTAL PROTEIN: 7.5 g/dL (ref 6.0–8.5)

## 2016-03-13 LAB — LIPID PANEL
CHOL/HDL RATIO: 3.1 ratio (ref 0.0–4.4)
CHOLESTEROL TOTAL: 188 mg/dL (ref 100–199)
HDL: 60 mg/dL (ref >39)
LDL Calculated: 114 mg/dL — ABNORMAL HIGH (ref 0–99)
TRIGLYCERIDES: 69 mg/dL (ref 0–149)
VLDL CHOLESTEROL CAL: 14 mg/dL (ref 5–40)

## 2016-03-13 LAB — CBC
HEMATOCRIT: 39 % (ref 34.0–46.6)
HEMOGLOBIN: 13 g/dL (ref 11.1–15.9)
MCH: 30.5 pg (ref 26.6–33.0)
MCHC: 33.3 g/dL (ref 31.5–35.7)
MCV: 92 fL (ref 79–97)
Platelets: 232 10*3/uL (ref 150–379)
RBC: 4.26 x10E6/uL (ref 3.77–5.28)
RDW: 13.4 % (ref 12.3–15.4)
WBC: 5.5 10*3/uL (ref 3.4–10.8)

## 2016-03-13 LAB — TSH: TSH: 1.16 u[IU]/mL (ref 0.450–4.500)

## 2016-03-14 LAB — CYTOLOGY - PAP
DIAGNOSIS: NEGATIVE
HPV: NOT DETECTED

## 2016-06-14 ENCOUNTER — Ambulatory Visit (INDEPENDENT_AMBULATORY_CARE_PROVIDER_SITE_OTHER): Payer: Medicaid Other

## 2016-06-14 DIAGNOSIS — Z3201 Encounter for pregnancy test, result positive: Secondary | ICD-10-CM

## 2016-06-14 DIAGNOSIS — Z3200 Encounter for pregnancy test, result unknown: Secondary | ICD-10-CM

## 2016-06-14 LAB — POCT URINE PREGNANCY: Preg Test, Ur: POSITIVE — AB

## 2016-06-14 NOTE — Patient Instructions (Signed)
Patient was told to make an appointment for Prenatal Care.

## 2016-06-14 NOTE — Progress Notes (Signed)
UPT-POS, patient told to make an appointment for prenatal care.

## 2016-06-23 ENCOUNTER — Encounter: Payer: Self-pay | Admitting: Student

## 2016-06-23 ENCOUNTER — Inpatient Hospital Stay (HOSPITAL_COMMUNITY)
Admission: AD | Admit: 2016-06-23 | Discharge: 2016-06-24 | Disposition: A | Discharge status: Home / Self Care | Payer: Medicaid Other | Source: Ambulatory Visit | Attending: Family Medicine | Admitting: Family Medicine

## 2016-06-23 DIAGNOSIS — R05 Cough: Secondary | ICD-10-CM | POA: Diagnosis not present

## 2016-06-23 DIAGNOSIS — Z9889 Other specified postprocedural states: Secondary | ICD-10-CM | POA: Insufficient documentation

## 2016-06-23 DIAGNOSIS — J101 Influenza due to other identified influenza virus with other respiratory manifestations: Secondary | ICD-10-CM

## 2016-06-23 DIAGNOSIS — Z79899 Other long term (current) drug therapy: Secondary | ICD-10-CM | POA: Insufficient documentation

## 2016-06-23 DIAGNOSIS — Z3A01 Less than 8 weeks gestation of pregnancy: Secondary | ICD-10-CM | POA: Insufficient documentation

## 2016-06-23 DIAGNOSIS — J09X2 Influenza due to identified novel influenza A virus with other respiratory manifestations: Secondary | ICD-10-CM | POA: Insufficient documentation

## 2016-06-23 DIAGNOSIS — R197 Diarrhea, unspecified: Secondary | ICD-10-CM | POA: Insufficient documentation

## 2016-06-23 DIAGNOSIS — O26891 Other specified pregnancy related conditions, first trimester: Secondary | ICD-10-CM | POA: Insufficient documentation

## 2016-06-23 DIAGNOSIS — R509 Fever, unspecified: Secondary | ICD-10-CM | POA: Insufficient documentation

## 2016-06-23 LAB — URINALYSIS, ROUTINE W REFLEX MICROSCOPIC
Bilirubin Urine: NEGATIVE
GLUCOSE, UA: NEGATIVE mg/dL
Hgb urine dipstick: NEGATIVE
Ketones, ur: NEGATIVE mg/dL
LEUKOCYTES UA: NEGATIVE
NITRITE: NEGATIVE
PH: 8 (ref 5.0–8.0)
Protein, ur: NEGATIVE mg/dL
Specific Gravity, Urine: 1.003 — ABNORMAL LOW (ref 1.005–1.030)

## 2016-06-23 MED ORDER — LACTATED RINGERS IV BOLUS (SEPSIS)
1000.0000 mL | Freq: Once | INTRAVENOUS | Status: AC
  Administered 2016-06-23: 1000 mL via INTRAVENOUS

## 2016-06-23 NOTE — MAU Provider Note (Signed)
History     CSN: [id number]  Arrival date and time: 06/23/16 2146   First Provider Initiated Contact with Patient 06/23/16 2259      Chief Complaint  Patient presents with  . Fatigue  . Diarrhea   HPI  Barbara Todd is a 36 y.o. [id number] at 3542w4d who presents with diarrhea, cough, & body aches. Symptoms began on Tuesday with fever/chills & body aches. Took her children to the doctor & they were diagnosed with the flu; pt was not seen then. Has had a nonproductive cough. Denies sore throat, ear pain, SOB, or wheezing.  Diarrhea started this morning. Reports 5 watery/loose stools. Some nausea and vomited once. Currently not nauseated. Denies abdominal pain or vaginal bleeding.   OB History    Gravida Para Term Preterm AB Living   7 3 3   3 3    SAB TAB Ectopic Multiple Live Births   2 1     3       Past Medical History:  Diagnosis Date  . Medical history non-contributory     Past Surgical History:  Procedure Laterality Date  . CESAREAN SECTION    . DILATION AND EVACUATION N/A 08/30/2014   Procedure: DILATATION AND EVACUATION;  Surgeon: Catalina AntiguaPeggy Constant, MD;  Location: [facility];  Service: Gynecology;  Laterality: N/A;    No family history on file.  Social History  Substance Use Topics  . Smoking status: Never Smoker  . Smokeless tobacco: Never Used  . Alcohol use No    Allergies: No Known Allergies  Prescriptions Prior to Admission  Medication Sig Dispense Refill Last Dose  . oseltamivir (TAMIFLU) 75 MG capsule Take 75 mg by mouth.     . butalbital-acetaminophen-caffeine (FIORICET, ESGIC) 50-325-40 MG tablet Take 1-2 tablets by mouth every 6 (six) hours as needed for headache. 20 tablet 0   . cyclobenzaprine (FLEXERIL) 10 MG tablet Take 1 tablet (10 mg total) by mouth 3 (three) times daily as needed for muscle spasms. 30 tablet 0   . HYDROcodone-acetaminophen (NORCO/VICODIN) 5-325 MG tablet Take 1-2 tablets by mouth every 4 (four) hours as needed. (Patient not taking:  Reported on 03/12/2016) 20 tablet 0 Not Taking  . ibuprofen (ADVIL,MOTRIN) 800 MG tablet Take 1 tablet (800 mg total) by mouth every 8 (eight) hours as needed. (Patient not taking: Reported on 03/12/2016) 30 tablet 0 Not Taking  . Prenatal Vit-Fe Fumarate-FA (PRENATAL MULTIVITAMIN) TABS tablet Take 1 tablet by mouth daily at 12 noon.    Taking  . promethazine (PHENERGAN) 25 MG tablet Take 0.5-1 tablets (12.5-25 mg total) by mouth every 6 (six) hours as needed. (Patient not taking: Reported on 03/12/2016) 30 tablet 0 Not Taking    Review of Systems  Constitutional: Positive for chills, fatigue and fever (didn't check temp).  HENT: Negative for ear pain and sore throat.   Respiratory: Positive for cough. Negative for wheezing.   Cardiovascular: Negative.   Gastrointestinal: Positive for diarrhea, nausea and vomiting. Negative for abdominal pain and constipation.  Genitourinary: Negative.   Musculoskeletal: Positive for myalgias.   Physical Exam   Blood pressure 112/61, pulse 73, temperature 97.3 F (36.3 C), resp. rate 22, height 5\' 2"  (1.575 m), weight 150 lb 6.4 oz (68.2 kg), last menstrual period 05/09/2016.  Physical Exam  Nursing note and vitals reviewed. Constitutional: She is oriented to person, place, and time. She appears well-developed and well-nourished. No distress.  HENT:  Head: Normocephalic and atraumatic.  Mouth/Throat: Mucous membranes are dry. No oropharyngeal exudate, posterior  oropharyngeal edema or posterior oropharyngeal erythema.  Eyes: Conjunctivae are normal. Right eye exhibits no discharge. Left eye exhibits no discharge. No scleral icterus.  Neck: Normal range of motion.  Cardiovascular: Normal rate, regular rhythm and normal heart sounds.   No murmur heard. Respiratory: Effort normal and breath sounds normal. No respiratory distress. She has no wheezes.  GI: Soft. Bowel sounds are normal. She exhibits no distension. There is no tenderness. There is no rebound  and no guarding.  Lymphadenopathy:       Head (right side): No submental and no submandibular adenopathy present.       Head (left side): No submental and no submandibular adenopathy present.  Neurological: She is alert and oriented to person, place, and time.  Skin: Skin is warm and dry. She is not diaphoretic.  Psychiatric: She has a normal mood and affect. Her behavior is normal. Judgment and thought content normal.    MAU Course  Procedures Results for orders placed or performed during the hospital encounter of 06/23/16 (from the past 24 hour(s))  Urinalysis, Routine w reflex microscopic     Status: Abnormal   Collection Time: 06/23/16 10:20 PM  Result Value Ref Range   Color, Urine STRAW (A) YELLOW   APPearance CLEAR CLEAR   Specific Gravity, Urine 1.003 (L) 1.005 - 1.030   pH 8.0 5.0 - 8.0   Glucose, UA NEGATIVE NEGATIVE mg/dL   Hgb urine dipstick NEGATIVE NEGATIVE   Bilirubin Urine NEGATIVE NEGATIVE   Ketones, ur NEGATIVE NEGATIVE mg/dL   Protein, ur NEGATIVE NEGATIVE mg/dL   Nitrite NEGATIVE NEGATIVE   Leukocytes, UA NEGATIVE NEGATIVE  Influenza panel by PCR (type A & B)     Status: Abnormal   Collection Time: 06/23/16 11:35 PM  Result Value Ref Range   Influenza A By PCR POSITIVE (A) NEGATIVE   Influenza B By PCR NEGATIVE NEGATIVE  CBC     Status: Abnormal   Collection Time: 06/23/16 11:46 PM  Result Value Ref Range   WBC 6.5 4.0 - 10.5 K/uL   RBC 4.07 3.87 - 5.11 MIL/uL   Hemoglobin 12.6 12.0 - 15.0 g/dL   HCT 09.8 (L) 11.9 - 14.7 %   MCV 86.7 78.0 - 100.0 fL   MCH 31.0 26.0 - 34.0 pg   MCHC 35.7 30.0 - 36.0 g/dL   RDW 82.9 56.2 - 13.0 %   Platelets 240 150 - 400 K/uL  Comprehensive metabolic panel     Status: None   Collection Time: 06/23/16 11:46 PM  Result Value Ref Range   Sodium 136 135 - 145 mmol/L   Potassium 3.7 3.5 - 5.1 mmol/L   Chloride 103 101 - 111 mmol/L   CO2 27 22 - 32 mmol/L   Glucose, Bld 87 65 - 99 mg/dL   BUN 8 6 - 20 mg/dL    Creatinine, Ser 8.65 0.44 - 1.00 mg/dL   Calcium 9.2 8.9 - 78.4 mg/dL   Total Protein 7.1 6.5 - 8.1 g/dL   Albumin 3.5 3.5 - 5.0 g/dL   AST 34 15 - 41 U/L   ALT 39 14 - 54 U/L   Alkaline Phosphatase 57 38 - 126 U/L   Total Bilirubin 0.3 0.3 - 1.2 mg/dL   GFR calc non Af Amer >60 >60 mL/min   GFR calc Af Amer >60 >60 mL/min   Anion gap 6 5 - 15    MDM Flu swab -- positive for flu A IV LR bolus No diarrhea or  vomiting while in MAU Assessment and Plan  A: 1. Influenza A    P: Discharge home Rx tamiflu Increase fluid intake Bland diet for diarrhea Start prenatal care Discussed reasons to return to MAU   Judeth Horn 06/23/2016, 10:59 PM

## 2016-06-23 NOTE — MAU Note (Signed)
Feel weak today. Denies vag bleeding. Diarrhea x 6 loose and then watery. Vomited x 1.

## 2016-06-24 DIAGNOSIS — J101 Influenza due to other identified influenza virus with other respiratory manifestations: Secondary | ICD-10-CM

## 2016-06-24 DIAGNOSIS — Z3A01 Less than 8 weeks gestation of pregnancy: Secondary | ICD-10-CM

## 2016-06-24 LAB — CBC
HCT: 35.3 % — ABNORMAL LOW (ref 36.0–46.0)
Hemoglobin: 12.6 g/dL (ref 12.0–15.0)
MCH: 31 pg (ref 26.0–34.0)
MCHC: 35.7 g/dL (ref 30.0–36.0)
MCV: 86.7 fL (ref 78.0–100.0)
PLATELETS: 240 10*3/uL (ref 150–400)
RBC: 4.07 MIL/uL (ref 3.87–5.11)
RDW: 13 % (ref 11.5–15.5)
WBC: 6.5 10*3/uL (ref 4.0–10.5)

## 2016-06-24 LAB — COMPREHENSIVE METABOLIC PANEL
ALBUMIN: 3.5 g/dL (ref 3.5–5.0)
ALT: 39 U/L (ref 14–54)
ANION GAP: 6 (ref 5–15)
AST: 34 U/L (ref 15–41)
Alkaline Phosphatase: 57 U/L (ref 38–126)
BUN: 8 mg/dL (ref 6–20)
CHLORIDE: 103 mmol/L (ref 101–111)
CO2: 27 mmol/L (ref 22–32)
Calcium: 9.2 mg/dL (ref 8.9–10.3)
Creatinine, Ser: 0.69 mg/dL (ref 0.44–1.00)
GFR calc non Af Amer: >60 mL/min (ref >60)
Glucose, Bld: 87 mg/dL (ref 65–99)
Potassium: 3.7 mmol/L (ref 3.5–5.1)
SODIUM: 136 mmol/L (ref 135–145)
Total Bilirubin: 0.3 mg/dL (ref 0.3–1.2)
Total Protein: 7.1 g/dL (ref 6.5–8.1)

## 2016-06-24 LAB — INFLUENZA PANEL BY PCR (TYPE A & B)
INFLBPCR: NEGATIVE
Influenza A By PCR: POSITIVE — AB

## 2016-06-24 MED ORDER — OSELTAMIVIR PHOSPHATE 75 MG PO CAPS: 75.0000 mg | ORAL_CAPSULE | Freq: Two times a day (BID) | ORAL | 0 refills | Status: DC

## 2016-06-24 NOTE — Discharge Instructions (Signed)
Influenza, Adult Influenza, more commonly known as the flu, is a viral infection that primarily affects the respiratory tract. The respiratory tract includes organs that help you breathe, such as the lungs, nose, and throat. The flu causes many common cold symptoms, as well as a high fever and body aches. The flu spreads easily from person to person (is contagious). Getting a flu shot (influenza vaccination) every year is the best way to prevent influenza. What are the causes? Influenza is caused by a virus. You can catch the virus by:  Breathing in droplets from an infected person's cough or sneeze.  Touching something that was recently contaminated with the virus and then touching your mouth, nose, or eyes. What increases the risk? The following factors may make you more likely to get the flu:  Not cleaning your hands frequently with soap and water or alcohol-based hand sanitizer.  Having close contact with many people during cold and flu season.  Touching your mouth, eyes, or nose without washing or sanitizing your hands first.  Not drinking enough fluids or not eating a healthy diet.  Not getting enough sleep or exercise.  Being under a high amount of stress.  Not getting a yearly (annual) flu shot. You may be at a higher risk of complications from the flu, such as a severe lung infection (pneumonia), if you:  Are over the age of 49.  Are pregnant.  Have a weakened disease-fighting system (immune system). You may have a weakened immune system if you:  Have HIV or AIDS.  Are undergoing chemotherapy.  Aretaking medicines that reduce the activity of (suppress) the immune system.  Have a long-term (chronic) illness, such as heart disease, kidney disease, diabetes, or lung disease.  Have a liver disorder.  Are obese.  Have anemia. What are the signs or symptoms? Symptoms of this condition typically last 4-10 days and may include:  Fever.  Chills.  Headache, body  aches, or muscle aches.  Sore throat.  Cough.  Runny or congested nose.  Chest discomfort and cough.  Poor appetite.  Weakness or tiredness (fatigue).  Dizziness.  Nausea or vomiting. How is this diagnosed? This condition may be diagnosed based on your medical history and a physical exam. Your health care provider may do a nose or throat swab test to confirm the diagnosis. How is this treated? If influenza is detected early, you can be treated with antiviral medicine that can reduce the length of your illness and the severity of your symptoms. This medicine may be given by mouth (orally) or through an IV tube that is inserted in one of your veins. The goal of treatment is to relieve symptoms by taking care of yourself at home. This may include taking over-the-counter medicines, drinking plenty of fluids, and adding humidity to the air in your home. In some cases, influenza goes away on its own. Severe influenza or complications from influenza may be treated in a hospital. Follow these instructions at home:  Take over-the-counter and prescription medicines only as told by your health care provider.  Use a cool mist humidifier to add humidity to the air in your home. This can make breathing easier.  Rest as needed.  Drink enough fluid to keep your urine clear or pale yellow.  Cover your mouth and nose when you cough or sneeze.  Wash your hands with soap and water often, especially after you cough or sneeze. If soap and water are not available, use hand sanitizer.  Stay home from  work or school as told by your health care provider. Unless you are visiting your health care provider, try to avoid leaving home until your fever has been gone for 24 hours without the use of medicine.  Keep all follow-up visits as told by your health care provider. This is important. How is this prevented?  Getting an annual flu shot is the best way to avoid getting the flu. You may get the flu shot  in late summer, fall, or winter. Ask your health care provider when you should get your flu shot.  Wash your hands often or use hand sanitizer often.  Avoid contact with people who are sick during cold and flu season.  Eat a healthy diet, drink plenty of fluids, get enough sleep, and exercise regularly. Contact a health care provider if:  You develop new symptoms.  You have:  Chest pain.  Diarrhea.  A fever.  Your cough gets worse.  You produce more mucus.  You feel nauseous or you vomit. Get help right away if:  You develop shortness of breath or difficulty breathing.  Your skin or nails turn a bluish color.  You have severe pain or stiffness in your neck.  You develop a sudden headache or sudden pain in your face or ear.  You cannot stop vomiting. This information is not intended to replace advice given to you by your health care provider. Make sure you discuss any questions you have with your health care provider. Document Released: 04/27/2000 Document Revised: 10/06/2015 Document Reviewed: 02/22/2015 Elsevier Interactive Patient Education  2017 [employer]     Diarrhea, Adult Diarrhea is frequent loose and watery bowel movements. Diarrhea can make you feel weak and cause you to become dehydrated. Dehydration can make you tired and thirsty, cause you to have a dry mouth, and decrease how often you urinate. Diarrhea typically lasts 2-3 days. However, it can last longer if it is a sign of something more serious. It is important to treat your diarrhea as told by your health care provider. Follow these instructions at home: Eating and drinking Follow these recommendations as told by your health care provider:  Take an oral rehydration solution (ORS). This is a drink that is sold at pharmacies and retail stores.  Drink clear fluids, such as water, ice chips, diluted fruit juice, and low-calorie sports drinks.  Eat bland, easy-to-digest foods in small amounts as  you are able. These foods include bananas, applesauce, rice, lean meats, toast, and crackers.  Avoid drinking fluids that contain a lot of sugar or caffeine, such as energy drinks, sports drinks, and soda.  Avoid alcohol.  Avoid spicy or fatty foods. General instructions  Drink enough fluid to keep your urine clear or pale yellow.  Wash your hands often. If soap and water are not available, use hand sanitizer.  Make sure that all people in your household wash their hands well and often.  Take over-the-counter and prescription medicines only as told by your health care provider.  Rest at home while you recover.  Watch your condition for any changes.  Take a warm bath to relieve any burning or pain from frequent diarrhea episodes.  Keep all follow-up visits as told by your health care provider. This is important. Contact a health care provider if:  You have a fever.  Your diarrhea gets worse.  You have new symptoms.  You cannot keep fluids down.  You feel light-headed or dizzy.  You have a headache  You have muscle  cramps. Get help right away if:  You have chest pain.  You feel extremely weak or you faint.  You have bloody or black stools or stools that look like tar.  You have severe pain, cramping, or bloating in your abdomen.  You have trouble breathing or you are breathing very quickly.  Your heart is beating very quickly.  Your skin feels cold and clammy.  You feel confused.  You have signs of dehydration, such as:  Dark urine, very little urine, or no urine.  Cracked lips.  Dry mouth.  Sunken eyes.  Sleepiness.  Weakness. This information is not intended to replace advice given to you by your health care provider. Make sure you discuss any questions you have with your health care provider. Document Released: 04/20/2002 Document Revised: 09/08/2015 Document Reviewed: 01/04/2015 Elsevier Interactive Patient Education  2017 [employer].

## 2016-06-24 NOTE — Progress Notes (Signed)
Erin Lawrence NP in earlier to discuss test results and d/c plan. Written and verbal d/c instructions given and understanding voiced 

## 2016-07-03 ENCOUNTER — Telehealth: Payer: Self-pay

## 2016-07-03 NOTE — Telephone Encounter (Signed)
Returned call, no answer, left vm 

## 2016-07-19 ENCOUNTER — Encounter: Payer: Self-pay | Admitting: Certified Nurse Midwife

## 2016-07-19 ENCOUNTER — Ambulatory Visit (INDEPENDENT_AMBULATORY_CARE_PROVIDER_SITE_OTHER): Payer: Medicaid Other | Admitting: Certified Nurse Midwife

## 2016-07-19 ENCOUNTER — Other Ambulatory Visit (HOSPITAL_COMMUNITY)
Admission: RE | Admit: 2016-07-19 | Discharge: 2016-07-19 | Disposition: A | Discharge status: Home / Self Care | Payer: Medicaid Other | Source: Ambulatory Visit | Attending: Certified Nurse Midwife | Admitting: Certified Nurse Midwife

## 2016-07-19 VITALS — BP 116/74 | HR 84 | Wt 148.4 lb

## 2016-07-19 DIAGNOSIS — Z01419 Encounter for gynecological examination (general) (routine) without abnormal findings: Secondary | ICD-10-CM | POA: Insufficient documentation

## 2016-07-19 DIAGNOSIS — O30031 Twin pregnancy, monochorionic/diamniotic, first trimester: Secondary | ICD-10-CM | POA: Diagnosis not present

## 2016-07-19 DIAGNOSIS — O09A1 Supervision of pregnancy with history of molar pregnancy, first trimester: Secondary | ICD-10-CM | POA: Diagnosis not present

## 2016-07-19 DIAGNOSIS — O30091 Twin pregnancy, unable to determine number of placenta and number of amniotic sacs, first trimester: Secondary | ICD-10-CM

## 2016-07-19 DIAGNOSIS — Z113 Encounter for screening for infections with a predominantly sexual mode of transmission: Secondary | ICD-10-CM | POA: Diagnosis present

## 2016-07-19 DIAGNOSIS — O0990 Supervision of high risk pregnancy, unspecified, unspecified trimester: Secondary | ICD-10-CM | POA: Insufficient documentation

## 2016-07-19 DIAGNOSIS — O34219 Maternal care for unspecified type scar from previous cesarean delivery: Secondary | ICD-10-CM

## 2016-07-19 DIAGNOSIS — Z1151 Encounter for screening for human papillomavirus (HPV): Secondary | ICD-10-CM | POA: Insufficient documentation

## 2016-07-19 MED ORDER — PRENATE PIXIE 10-0.6-0.4-200 MG PO CAPS: 1.0000 | ORAL_CAPSULE | Freq: Every day | ORAL | 12 refills | Status: DC

## 2016-07-19 NOTE — Progress Notes (Signed)
Patient has fears because of her history of miscarriage.
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
## 2016-07-20 ENCOUNTER — Ambulatory Visit (HOSPITAL_COMMUNITY)
Admission: RE | Admit: 2016-07-20 | Discharge: 2016-07-20 | Disposition: A | Discharge status: Home / Self Care | Payer: Medicaid Other | Source: Ambulatory Visit | Attending: Certified Nurse Midwife | Admitting: Certified Nurse Midwife

## 2016-07-20 ENCOUNTER — Other Ambulatory Visit: Payer: Self-pay | Admitting: Certified Nurse Midwife

## 2016-07-20 DIAGNOSIS — O30091 Twin pregnancy, unable to determine number of placenta and number of amniotic sacs, first trimester: Secondary | ICD-10-CM

## 2016-07-20 DIAGNOSIS — O09A1 Supervision of pregnancy with history of molar pregnancy, first trimester: Secondary | ICD-10-CM | POA: Insufficient documentation

## 2016-07-20 DIAGNOSIS — O30001 Twin pregnancy, unspecified number of placenta and unspecified number of amniotic sacs, first trimester: Secondary | ICD-10-CM | POA: Diagnosis not present

## 2016-07-20 DIAGNOSIS — Z3A10 10 weeks gestation of pregnancy: Secondary | ICD-10-CM | POA: Insufficient documentation

## 2016-07-20 DIAGNOSIS — O34219 Maternal care for unspecified type scar from previous cesarean delivery: Secondary | ICD-10-CM

## 2016-07-20 LAB — CERVICOVAGINAL ANCILLARY ONLY
BACTERIAL VAGINITIS: NEGATIVE
CANDIDA VAGINITIS: NEGATIVE
Chlamydia: NEGATIVE
NEISSERIA GONORRHEA: NEGATIVE
TRICH (WINDOWPATH): NEGATIVE

## 2016-07-23 ENCOUNTER — Other Ambulatory Visit: Payer: Self-pay | Admitting: Certified Nurse Midwife

## 2016-07-23 LAB — CYTOLOGY - PAP
Diagnosis: NEGATIVE
HPV (WINDOPATH): NOT DETECTED

## 2016-07-23 LAB — URINE CULTURE, OB REFLEX

## 2016-07-23 LAB — CULTURE, OB URINE

## 2016-07-24 LAB — HEMOGLOBINOPATHY EVALUATION
HEMOGLOBIN A2 QUANTITATION: 2.7 % (ref 1.8–3.2)
HGB C: 0 %
HGB S: 0 %
HGB VARIANT: 0 %
Hemoglobin F Quantitation: 0 % (ref 0.0–2.0)
Hgb A: 97.3 % (ref 96.4–98.8)

## 2016-07-24 LAB — OBSTETRIC PANEL, INCLUDING HIV
Antibody Screen: NEGATIVE
BASOS: 0 %
Basophils Absolute: 0 10*3/uL (ref 0.0–0.2)
EOS (ABSOLUTE): 0.3 10*3/uL (ref 0.0–0.4)
EOS: 3 %
HEMATOCRIT: 36.5 % (ref 34.0–46.6)
HEMOGLOBIN: 12.2 g/dL (ref 11.1–15.9)
HIV Screen 4th Generation wRfx: NONREACTIVE
Hepatitis B Surface Ag: NEGATIVE
IMMATURE GRANS (ABS): 0.1 10*3/uL (ref 0.0–0.1)
Immature Granulocytes: 1 %
LYMPHS ABS: 2.2 10*3/uL (ref 0.7–3.1)
Lymphs: 19 %
MCH: 30.6 pg (ref 26.6–33.0)
MCHC: 33.4 g/dL (ref 31.5–35.7)
MCV: 92 fL (ref 79–97)
MONOS ABS: 0.5 10*3/uL (ref 0.1–0.9)
Monocytes: 4 %
NEUTROS ABS: 8.7 10*3/uL — AB (ref 1.4–7.0)
Neutrophils: 73 %
Platelets: 328 10*3/uL (ref 150–379)
RBC: 3.99 x10E6/uL (ref 3.77–5.28)
RDW: 13.9 % (ref 12.3–15.4)
RH TYPE: POSITIVE
RPR Ser Ql: NONREACTIVE
Rubella Antibodies, IGG: 14.4 index (ref >0.99)
WBC: 11.7 10*3/uL — AB (ref 3.4–10.8)

## 2016-07-24 LAB — BETA HCG QUANT (REF LAB): hCG Quant: 169517 m[IU]/mL

## 2016-07-24 LAB — HEMOGLOBIN A1C
Est. average glucose Bld gHb Est-mCnc: 97 mg/dL
Hgb A1c MFr Bld: 5 % (ref 4.8–5.6)

## 2016-07-24 LAB — TSH: TSH: 0.236 u[IU]/mL — ABNORMAL LOW (ref 0.450–4.500)

## 2016-07-24 LAB — VARICELLA ZOSTER ANTIBODY, IGG: VARICELLA: 695 {index} (ref >165)

## 2016-07-26 ENCOUNTER — Other Ambulatory Visit: Payer: Self-pay | Admitting: Certified Nurse Midwife

## 2016-07-26 LAB — THYROID PANEL
FREE THYROXINE INDEX: 2.2 (ref 1.2–4.9)
T3 Uptake Ratio: 20 % — ABNORMAL LOW (ref 24–39)
T4, Total: 11 ug/dL (ref 4.5–12.0)

## 2016-07-26 LAB — SPECIMEN STATUS REPORT

## 2016-08-01 LAB — CYSTIC FIBROSIS MUTATION 97: GENE DIS ANAL CARRIER INTERP BLD/T-IMP: NOT DETECTED

## 2016-08-16 ENCOUNTER — Ambulatory Visit (INDEPENDENT_AMBULATORY_CARE_PROVIDER_SITE_OTHER): Payer: Medicaid Other | Admitting: Obstetrics & Gynecology

## 2016-08-16 ENCOUNTER — Inpatient Hospital Stay (HOSPITAL_COMMUNITY)
Admission: AD | Admit: 2016-08-16 | Discharge: 2016-08-16 | Disposition: A | Discharge status: Home / Self Care | Payer: Medicaid Other | Source: Ambulatory Visit | Attending: Obstetrics & Gynecology | Admitting: Obstetrics & Gynecology

## 2016-08-16 ENCOUNTER — Inpatient Hospital Stay (HOSPITAL_COMMUNITY): Payer: Medicaid Other

## 2016-08-16 ENCOUNTER — Encounter (HOSPITAL_COMMUNITY): Payer: Self-pay | Admitting: *Deleted

## 2016-08-16 DIAGNOSIS — Z3A14 14 weeks gestation of pregnancy: Secondary | ICD-10-CM | POA: Insufficient documentation

## 2016-08-16 DIAGNOSIS — N939 Abnormal uterine and vaginal bleeding, unspecified: Secondary | ICD-10-CM | POA: Insufficient documentation

## 2016-08-16 DIAGNOSIS — O30031 Twin pregnancy, monochorionic/diamniotic, first trimester: Secondary | ICD-10-CM

## 2016-08-16 DIAGNOSIS — N898 Other specified noninflammatory disorders of vagina: Secondary | ICD-10-CM | POA: Diagnosis not present

## 2016-08-16 DIAGNOSIS — O4692 Antepartum hemorrhage, unspecified, second trimester: Secondary | ICD-10-CM

## 2016-08-16 DIAGNOSIS — O34219 Maternal care for unspecified type scar from previous cesarean delivery: Secondary | ICD-10-CM | POA: Diagnosis not present

## 2016-08-16 DIAGNOSIS — Z8249 Family history of ischemic heart disease and other diseases of the circulatory system: Secondary | ICD-10-CM | POA: Insufficient documentation

## 2016-08-16 DIAGNOSIS — IMO0002 Reserved for concepts with insufficient information to code with codable children: Secondary | ICD-10-CM

## 2016-08-16 DIAGNOSIS — O0991 Supervision of high risk pregnancy, unspecified, first trimester: Secondary | ICD-10-CM | POA: Diagnosis not present

## 2016-08-16 DIAGNOSIS — Z79899 Other long term (current) drug therapy: Secondary | ICD-10-CM | POA: Insufficient documentation

## 2016-08-16 DIAGNOSIS — O0990 Supervision of high risk pregnancy, unspecified, unspecified trimester: Secondary | ICD-10-CM

## 2016-08-16 DIAGNOSIS — O209 Hemorrhage in early pregnancy, unspecified: Secondary | ICD-10-CM | POA: Diagnosis not present

## 2016-08-16 DIAGNOSIS — O30002 Twin pregnancy, unspecified number of placenta and unspecified number of amniotic sacs, second trimester: Secondary | ICD-10-CM | POA: Diagnosis not present

## 2016-08-16 DIAGNOSIS — O30049 Twin pregnancy, dichorionic/diamniotic, unspecified trimester: Secondary | ICD-10-CM | POA: Diagnosis not present

## 2016-08-16 DIAGNOSIS — Z9889 Other specified postprocedural states: Secondary | ICD-10-CM | POA: Insufficient documentation

## 2016-08-16 LAB — URINALYSIS, ROUTINE W REFLEX MICROSCOPIC
Bacteria, UA: NONE SEEN
Bilirubin Urine: NEGATIVE
GLUCOSE, UA: NEGATIVE mg/dL
KETONES UR: NEGATIVE mg/dL
LEUKOCYTES UA: NEGATIVE
Nitrite: NEGATIVE
PROTEIN: NEGATIVE mg/dL
SQUAMOUS EPITHELIAL / LPF: NONE SEEN
Specific Gravity, Urine: 1.001 — ABNORMAL LOW (ref 1.005–1.030)
WBC, UA: NONE SEEN WBC/hpf (ref 0–5)
pH: 7 (ref 5.0–8.0)

## 2016-08-16 NOTE — Discharge Instructions (Signed)
Vaginal Bleeding During Pregnancy, Second Trimester °A small amount of bleeding (spotting) from the vagina is common in pregnancy. Sometimes the bleeding is normal and is not a problem, and sometimes it is a sign of something serious. Be sure to tell your doctor about any bleeding from your vagina right away. °Follow these instructions at home: °· Watch your condition for any changes. °· Follow your doctor's instructions about how active you can be. °· If you are on bed rest: °¨ You may need to stay in bed and only get up to use the bathroom. °¨ You may be allowed to do some activities. °¨ If you need help, make plans for someone to help you. °· Write down: °¨ The number of pads you use each day. °¨ How often you change pads. °¨ How soaked (saturated) your pads are. °· Do not use tampons. °· Do not douche. °· Do not have sex or orgasms until your doctor says it is okay. °· If you pass any tissue from your vagina, save the tissue so you can show it to your doctor. °· Only take medicines as told by your doctor. °· Do not take aspirin because it can make you bleed. °· Do not exercise, lift heavy weights, or do any activities that take a lot of energy and effort unless your doctor says it is okay. °· Keep all follow-up visits as told by your doctor. °Contact a doctor if: °· You bleed from your vagina. °· You have cramps. °· You have labor pains. °· You have a fever that does not go away after you take medicine. °Get help right away if: °· You have very bad cramps in your back or belly (abdomen). °· You have contractions. °· You have chills. °· You pass large clots or tissue from your vagina. °· You bleed more. °· You feel light-headed or weak. °· You pass out (faint). °· You are leaking fluid or have a gush of fluid from your vagina. °This information is not intended to replace advice given to you by your health care provider. Make sure you discuss any questions you have with your health care provider. °Document  Released: 09/14/2013 Document Revised: 10/06/2015 Document Reviewed: 01/05/2013 °Elsevier Interactive Patient Education © 2017 Elsevier Inc. ° °

## 2016-08-16 NOTE — MAU Note (Signed)
ssisted pt to bed had a moderat amount of bright red bleeding on her underwear. Pad place. +FH tones herd with doppler. Pt denies pain at this time.

## 2016-08-16 NOTE — Patient Instructions (Signed)
Multiple Pregnancy Having a multiple pregnancy means that a woman is carrying more than one baby at a time. She may be pregnant with twins, triplets, or more. The majority of multiple pregnancies are twins. Naturally conceiving triplets or more (higher-order multiples) is rare. Multiple pregnancies are riskier than single pregnancies. A woman with a multiple pregnancy is more likely to have certain problems during her pregnancy. Therefore, she will need to have more frequent appointments for prenatal care. How does a multiple pregnancy happen? A multiple pregnancy happens when:  The woman's body releases more than one egg at a time, and then each egg gets fertilized by a different sperm.  This is the most common type of multiple pregnancy.  Twins or other multiples produced this way are fraternal. They are no more alike than non-multiple siblings are.  One sperm fertilizes one egg, which then divides into more than one embryo.  Twins or other multiples produced this way are identical. Identical multiples are always the same gender, and they look very much alike. Who is most likely to have a multiple pregnancy? A multiple pregnancy is more likely to develop in women who:  Have had fertility treatment, especially if the treatment included fertility drugs.  Are older than 35 years of age.  Have already had four or more children.  Have a family history of multiple pregnancy. How is a multiple pregnancy diagnosed? A multiple pregnancy may be diagnosed based on:  Symptoms such as:  Rapid weight gain in the first 3 months of pregnancy (first trimester).  More severe nausea and breast tenderness than what is typical of a single pregnancy.  The uterus measuring larger than what is normal for the stage of the pregnancy.  Blood tests that detect a higher-than-normal level of human chorionic gonadotropin (hCG). This is a hormone that your body produces in early pregnancy.  Ultrasound exam.  This is used to confirm that you are carrying multiples. What risks are associated with multiple pregnancy? A multiple pregnancy puts you at a higher risk for certain problems during or after your pregnancy, including:  Having your babies delivered before you have reached a full-term pregnancy (preterm birth). A full-term pregnancy lasts for at least 37 weeks. Babies born before 37 weeks may have a higher risk of a variety of health problems, such as breathing problems, feeding difficulties, cerebral palsy, and learning disabilities.  Diabetes.  Preeclampsia. This is a serious condition that causes high blood pressure along with other symptoms, such as swelling and headaches, during pregnancy.  Excessive blood loss after childbirth (postpartum hemorrhage).  Postpartum depression.  Low birth weight of the babies. How will having a multiple pregnancy affect my care? Your health care provider will want to monitor you more closely during your pregnancy to make sure that your babies are growing normally and that you are healthy. Follow these instructions at home: Because your pregnancy is considered to be high risk, you will need to work closely with your health care team. You may also need to make some lifestyle changes. These may include the following: Eating and drinking   Increase your nutrition.  Follow your health care provider's recommendations for weight gain. You may need to gain a little extra weight when you are pregnant with multiples.  Eat healthy snacks often throughout the day. This can add calories and reduce nausea.  Drink enough fluid to keep your urine clear or pale yellow.  Take prenatal vitamins. Activity  By 20-24 weeks, you may need to   limit your activities.  Avoid activities and work that take a lot of effort (are strenuous).  Ask your health care provider when you should stop having sexual intercourse.  Rest often. General instructions   Do not use any  products that contain nicotine or tobacco, such as cigarettes and e-cigarettes. If you need help quitting, ask your health care provider.  Do not drink alcohol or use illegal drugs.  Take over-the-counter and prescription medicines only as told by your health care provider.  Arrange for extra help around the house.  Keep all follow-up visits and all prenatal visits as told by your health care provider. This is important. Contact a health care provider if:  You have dizziness.  You have persistent nausea, vomiting, or diarrhea.  You are having trouble gaining weight.  You have feelings of depression or other emotions that are interfering with your normal activities. Get help right away if:  You have a fever.  You have pain with urination.  You have fluid leaking from your vagina.  You have a bad-smelling vaginal discharge.  You notice increased swelling in your face, hands, legs, or ankles.  You have spotting or bleeding from your vagina.  You have pelvic cramps, pelvic pressure, or nagging pain in your abdomen or lower back.  You are having regular contractions.  You develop a severe headache, with or without visual changes.  You have shortness of breath or chest pain.  You notice less fetal movement, or no fetal movement. This information is not intended to replace advice given to you by your health care provider. Make sure you discuss any questions you have with your health care provider. Document Released: 02/07/2008 Document Revised: 12/30/2015 Document Reviewed: 12/30/2015 Elsevier Interactive Patient Education  2017 Elsevier Inc.  
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
## 2016-08-16 NOTE — Progress Notes (Signed)
   PRENATAL VISIT NOTE  Subjective:  Barbara Todd is a 36 y.o. [id number] at [redacted]w[redacted]d being seen today for ongoing prenatal care.  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  Patient reports no complaints.  Contractions: Not present. Vag. Bleeding: None.   . Denies leaking of fluid.   The following portions of the patient's history were reviewed and updated as appropriate: allergies, current medications, past family history, past medical history, past social history, past surgical history and problem list. Problem list updated.  Objective:   Vitals:   08/16/16 0944  BP: 112/75  Pulse: 91  Weight: 156 lb 9.6 oz (71 kg)    Fetal Status:           General:  Alert, oriented and cooperative. Patient is in no acute distress.  Skin: Skin is warm and dry. No rash noted.   Cardiovascular: Normal heart rate noted  Respiratory: Normal respiratory effort, no problems with respiration noted  Abdomen: Soft, gravid, appropriate for gestational age. Pain/Pressure: Absent     Pelvic:  Cervical exam deferred        Extremities: Normal range of motion.  Edema: None  Mental Status: Normal mood and affect. Normal behavior. Normal judgment and thought content.  [place] 4 weeks Assessment and Plan:  Pregnancy: [id number] at [redacted]w[redacted]d  1. Supervision of high risk pregnancy, antepartum   2. Dizygotic twins [place] scheduled, Quad screen next  Preterm labor symptoms and general obstetric precautions including but not limited to vaginal bleeding, contractions, leaking of fluid and fetal movement were reviewed in detail with the patient. Please refer to After Visit Summary for other counseling  recommendations.  Return in about 4 weeks (around 09/13/2016).   Adam Phenix, MD

## 2016-08-16 NOTE — Progress Notes (Signed)
Patient is in the office, denies pain/pressure. 

## 2016-08-16 NOTE — MAU Note (Signed)
Pt saw blood when she went to the BR 30 minutes ago, was on toilet tissue & in toilet.  Denies pain.

## 2016-08-17 ENCOUNTER — Encounter (HOSPITAL_COMMUNITY): Payer: Self-pay

## 2016-08-22 ENCOUNTER — Ambulatory Visit (INDEPENDENT_AMBULATORY_CARE_PROVIDER_SITE_OTHER): Payer: Medicaid Other | Admitting: Obstetrics and Gynecology

## 2016-08-22 VITALS — BP 107/72 | HR 89 | Wt 157.0 lb

## 2016-08-22 DIAGNOSIS — O30032 Twin pregnancy, monochorionic/diamniotic, second trimester: Secondary | ICD-10-CM

## 2016-08-22 DIAGNOSIS — IMO0002 Reserved for concepts with insufficient information to code with codable children: Secondary | ICD-10-CM

## 2016-08-22 DIAGNOSIS — O34219 Maternal care for unspecified type scar from previous cesarean delivery: Secondary | ICD-10-CM

## 2016-08-22 DIAGNOSIS — O0992 Supervision of high risk pregnancy, unspecified, second trimester: Secondary | ICD-10-CM

## 2016-08-22 DIAGNOSIS — O09A2 Supervision of pregnancy with history of molar pregnancy, second trimester: Secondary | ICD-10-CM

## 2016-08-22 DIAGNOSIS — O0990 Supervision of high risk pregnancy, unspecified, unspecified trimester: Secondary | ICD-10-CM

## 2016-08-22 NOTE — Patient Instructions (Signed)

## 2016-08-22 NOTE — Progress Notes (Signed)
   PRENATAL VISIT NOTE  Subjective:  Barbara Todd is a 36 y.o. [id number] at [redacted]w[redacted]d being seen today for ongoing prenatal care.  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  Patient reports dark staining when wiping. No further episode of vaginal bleeding.  Contractions: Not present. Vag. Bleeding: None.  Movement: Present. Denies leaking of fluid.   The following portions of the patient's history were reviewed and updated as appropriate: allergies, current medications, past family history, past medical history, past social history, past surgical history and problem list. Problem list updated.  Objective:   Vitals:   08/22/16 1446  BP: 107/72  Pulse: 89  Weight: 157 lb (71.2 kg)    Fetal Status: Fetal Heart Rate (bpm): 145/150   Movement: Present     General:  Alert, oriented and cooperative. Patient is in no acute distress.  Skin: Skin is warm and dry. No rash noted.   Cardiovascular: Normal heart rate noted  Respiratory: Normal respiratory effort, no problems with respiration noted  Abdomen: Soft, gravid, appropriate for gestational age. Pain/Pressure: Absent     Pelvic:  Cervical exam deferred        Extremities: Normal range of motion.  Edema: None  Mental Status: Normal mood and affect. Normal behavior. Normal judgment and thought content.   Assessment and Plan:  Pregnancy: [id number] at [redacted]w[redacted]d  1. Supervision of high risk pregnancy, antepartum Patient is doing well. Discussed weight gain during pregnancy with focus on healthy eating, daily walk. Patient informed of 20lb weight gain  2. Previous cesarean section complicating pregnancy, antepartum  condition or complication Plan for repeat with BTL  3. Dizygotic twins Follow up anatomy in May  General obstetric precautions including but not limited to vaginal bleeding, contractions, leaking of fluid and fetal movement were reviewed in detail with the patient. Please refer to After Visit Summary for other counseling recommendations.  Return in about 4 weeks (around 09/19/2016) for ROB.   Catalina Antigua, MD

## 2016-08-22 NOTE — Progress Notes (Signed)
Patient states that bleeding has dried up since she was at the hospital on 08-17-26, and denies pain/contractions.

## 2016-09-11 ENCOUNTER — Other Ambulatory Visit: Payer: Self-pay | Admitting: Obstetrics and Gynecology

## 2016-09-11 ENCOUNTER — Other Ambulatory Visit (HOSPITAL_COMMUNITY)
Admission: RE | Admit: 2016-09-11 | Discharge: 2016-09-11 | Disposition: A | Discharge status: Home / Self Care | Payer: Medicaid Other | Source: Ambulatory Visit | Attending: Obstetrics and Gynecology | Admitting: Obstetrics and Gynecology

## 2016-09-11 ENCOUNTER — Ambulatory Visit (INDEPENDENT_AMBULATORY_CARE_PROVIDER_SITE_OTHER): Payer: Medicaid Other | Admitting: Obstetrics and Gynecology

## 2016-09-11 VITALS — BP 119/80 | HR 98 | Wt 162.8 lb

## 2016-09-11 DIAGNOSIS — O23592 Infection of other part of genital tract in pregnancy, second trimester: Secondary | ICD-10-CM | POA: Insufficient documentation

## 2016-09-11 DIAGNOSIS — O34219 Maternal care for unspecified type scar from previous cesarean delivery: Secondary | ICD-10-CM | POA: Diagnosis not present

## 2016-09-11 DIAGNOSIS — O09A0 Supervision of pregnancy with history of molar pregnancy, unspecified trimester: Secondary | ICD-10-CM

## 2016-09-11 DIAGNOSIS — B373 Candidiasis of vulva and vagina: Secondary | ICD-10-CM | POA: Diagnosis not present

## 2016-09-11 DIAGNOSIS — B9689 Other specified bacterial agents as the cause of diseases classified elsewhere: Secondary | ICD-10-CM | POA: Diagnosis not present

## 2016-09-11 DIAGNOSIS — IMO0002 Reserved for concepts with insufficient information to code with codable children: Secondary | ICD-10-CM

## 2016-09-11 DIAGNOSIS — Z3A17 17 weeks gestation of pregnancy: Secondary | ICD-10-CM | POA: Diagnosis not present

## 2016-09-11 DIAGNOSIS — O30032 Twin pregnancy, monochorionic/diamniotic, second trimester: Secondary | ICD-10-CM | POA: Diagnosis not present

## 2016-09-11 DIAGNOSIS — O209 Hemorrhage in early pregnancy, unspecified: Secondary | ICD-10-CM | POA: Diagnosis not present

## 2016-09-11 MED ORDER — MISC. DEVICES MISC: 0 refills | Status: DC

## 2016-09-11 NOTE — Patient Instructions (Signed)

## 2016-09-11 NOTE — Progress Notes (Signed)
Patient has dark discharge when she wipes. Patient is concerned that she is still having the light discharge.

## 2016-09-11 NOTE — Addendum Note (Signed)
Addended by: Elby Beck F on: 09/11/2016 09:09 AM   Modules accepted: Orders

## 2016-09-11 NOTE — Progress Notes (Signed)
Subjective:  Barbara Todd is a 36 y.o. [id number] at [redacted]w[redacted]d being seen today for ongoing prenatal care.  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  Patient reports dark bloody discharge for several weeks. Denies cramps, presure, recent IC or LOF.  Contractions: Not present. Vag. Bleeding: None.  Movement: Present. Denies leaking of fluid.   The following portions of the patient's history were reviewed and updated as appropriate: allergies, current medications, past family history, past medical history, past social history, past surgical history and problem list. Problem list updated.  Objective:   Vitals:   09/11/16 0824  BP: 119/80  Pulse: 98  Weight: 73.8 kg (162 lb 12.8 oz)    Fetal Status: Fetal Heart Rate (bpm): 140/143   Movement: Present     General:  Alert, oriented and cooperative. Patient is in no acute distress.  Skin: Skin is warm and dry. No rash noted.   Cardiovascular: Normal heart rate noted  Respiratory: Normal respiratory effort, no problems with respiration noted  Abdomen: Soft, gravid, appropriate for gestational age. Pain/Pressure: Absent     Pelvic:  Cervical exam performed      Small blood clot noted at os, partially removed, wet prep collected, closed  Extremities: Normal range of motion.  Edema: None  Mental Status: Normal mood and affect. Normal behavior. Normal judgment and thought content.   Urinalysis: Urine Protein: Negative Urine Glucose: Negative  Assessment and Plan:  Pregnancy: [id number] at [redacted]w[redacted]d  1. Encounter for supervision of high risk pregnancy with history of trophoblastic disease, antepartum Stable. AFP today  2.  Dizygotic twins  - AFP, Quad Screen - [place] MFM OB COMP + 14 WK; Future  3. Previous cesarean section complicating pregnancy, antepartum condition or complication Desires repeat with BTL  4. Vaginal bleeding before [redacted] weeks gestation Suspect related to blood clot. Original of blood clot unknown, ? SCH Do not think PTL Will check U/S tomorrow with CL not next week - [place] MFM OB COMP + 14 WK; Future  Preterm labor symptoms and general obstetric precautions including but not limited to vaginal bleeding, contractions, leaking of fluid and fetal movement were reviewed in detail with the patient. Please refer to After Visit Summary for other counseling recommendations.  No Follow-up on file.   Hermina Staggers, MD

## 2016-09-12 ENCOUNTER — Other Ambulatory Visit: Payer: Self-pay | Admitting: Obstetrics & Gynecology

## 2016-09-12 ENCOUNTER — Encounter: Payer: Medicaid Other | Admitting: Obstetrics and Gynecology

## 2016-09-12 ENCOUNTER — Ambulatory Visit (HOSPITAL_COMMUNITY)
Admission: RE | Admit: 2016-09-12 | Discharge: 2016-09-12 | Disposition: A | Discharge status: Home / Self Care | Payer: Medicaid Other | Source: Ambulatory Visit | Attending: Obstetrics and Gynecology | Admitting: Obstetrics and Gynecology

## 2016-09-12 DIAGNOSIS — Z3A18 18 weeks gestation of pregnancy: Secondary | ICD-10-CM | POA: Diagnosis not present

## 2016-09-12 DIAGNOSIS — Z363 Encounter for antenatal screening for malformations: Secondary | ICD-10-CM

## 2016-09-12 DIAGNOSIS — O30042 Twin pregnancy, dichorionic/diamniotic, second trimester: Secondary | ICD-10-CM

## 2016-09-12 DIAGNOSIS — IMO0002 Reserved for concepts with insufficient information to code with codable children: Secondary | ICD-10-CM

## 2016-09-12 LAB — CERVICOVAGINAL ANCILLARY ONLY
BACTERIAL VAGINITIS: NEGATIVE
CANDIDA VAGINITIS: POSITIVE — AB
Chlamydia: NEGATIVE
Neisseria Gonorrhea: NEGATIVE
Trichomonas: NEGATIVE

## 2016-09-14 ENCOUNTER — Other Ambulatory Visit: Payer: Self-pay

## 2016-09-14 LAB — AFP, QUAD SCREEN
DIA Mom Value: 2.57
DIA VALUE (EIA): 408.42 pg/mL
DSR (By Age)    1 IN: 332
DSR (SECOND TRIMESTER) 1 IN: 516
Gestational Age: 18.4 WEEKS
MATERNAL AGE AT EDD: 34.5 a
MSAFP Mom: 1.99
MSAFP: 92 ng/mL
MSHCG MOM: 2.6
MSHCG: 63087 m[IU]/mL
Osb Risk: 3427
TEST RESULTS AFP: NEGATIVE
WEIGHT: 168 [lb_av]
uE3 Mom: 1.27
uE3 Value: 1.69 ng/mL

## 2016-09-14 MED ORDER — TERCONAZOLE 0.4 % VA CREA: 1.0000 | TOPICAL_CREAM | Freq: Every day | VAGINAL | 0 refills | Status: DC

## 2016-09-17 ENCOUNTER — Ambulatory Visit (HOSPITAL_COMMUNITY): Payer: Medicaid Other

## 2016-09-20 ENCOUNTER — Encounter: Payer: Self-pay | Admitting: *Deleted

## 2016-10-09 ENCOUNTER — Ambulatory Visit (INDEPENDENT_AMBULATORY_CARE_PROVIDER_SITE_OTHER): Payer: Medicaid Other | Admitting: Obstetrics and Gynecology

## 2016-10-09 VITALS — BP 116/73 | HR 97 | Wt 171.0 lb

## 2016-10-09 DIAGNOSIS — O30032 Twin pregnancy, monochorionic/diamniotic, second trimester: Secondary | ICD-10-CM

## 2016-10-09 DIAGNOSIS — IMO0002 Reserved for concepts with insufficient information to code with codable children: Secondary | ICD-10-CM

## 2016-10-09 DIAGNOSIS — O0990 Supervision of high risk pregnancy, unspecified, unspecified trimester: Secondary | ICD-10-CM

## 2016-10-09 DIAGNOSIS — O0992 Supervision of high risk pregnancy, unspecified, second trimester: Secondary | ICD-10-CM | POA: Diagnosis not present

## 2016-10-09 DIAGNOSIS — R899 Unspecified abnormal finding in specimens from other organs, systems and tissues: Secondary | ICD-10-CM | POA: Insufficient documentation

## 2016-10-09 DIAGNOSIS — O34219 Maternal care for unspecified type scar from previous cesarean delivery: Secondary | ICD-10-CM

## 2016-10-09 DIAGNOSIS — O09A2 Supervision of pregnancy with history of molar pregnancy, second trimester: Secondary | ICD-10-CM

## 2016-10-09 NOTE — Patient Instructions (Signed)

## 2016-10-09 NOTE — Progress Notes (Signed)
Subjective:  Barbara Todd is a 36 y.o. [id number] at 9767w6d being seen today for ongoing prenatal care.  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  Patient reports no complaints.  Contractions: Not present. Vag. Bleeding: None.  Movement: Present. Denies leaking of fluid.   The following portions of the patient's history were reviewed and updated as appropriate: allergies, current medications, past family history, past medical history, past social history, past surgical history and problem list. Problem list updated.  Objective:   Vitals:   10/09/16 0852  BP: 116/73  Pulse: 97  Weight: 171 lb (77.6 kg)    Fetal Status:     Movement: Present     General:  Alert, oriented and cooperative. Patient is in no acute distress.  Skin: Skin is warm and dry. No rash noted.   Cardiovascular: Normal heart rate noted  Respiratory: Normal respiratory effort, no problems with respiration noted  Abdomen: Soft, gravid, appropriate for gestational age. Pain/Pressure: Absent     Pelvic:  Cervical exam deferred        Extremities: Normal range of motion.  Edema: None  Mental Status: Normal mood and affect. Normal behavior. Normal judgment and thought content.   Urinalysis:      Assessment and Plan:  Pregnancy: [id number] at 1767w6d  1. Dizygotic twins Stable F/U U/S next   2. Supervision of high risk pregnancy, antepartum   3. Previous cesarean section complicating pregnancy, antepartum condition or complication Desires Repeat and BTL  4. 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 Abnormal laboratory test  - Thyroid Panel  With TSH  Preterm labor symptoms and general obstetric precautions including but not limited to vaginal bleeding, contractions, leaking of fluid and fetal movement were reviewed in detail with the patient. Please refer to After Visit Summary for other counseling recommendations.  Return in about 4 weeks (around 11/06/2016) for OB visit.   Hermina StaggersErvin, Akeria Hedstrom L, MD

## 2016-10-10 LAB — THYROID PANEL WITH TSH
Free Thyroxine Index: 1 — ABNORMAL LOW (ref 1.2–4.9)
T3 UPTAKE RATIO: 13 % — AB (ref 24–39)
T4 TOTAL: 7.8 ug/dL (ref 4.5–12.0)
TSH: 1.13 u[IU]/mL (ref 0.450–4.500)

## 2016-10-15 ENCOUNTER — Encounter (HOSPITAL_COMMUNITY): Payer: Self-pay

## 2016-10-15 ENCOUNTER — Other Ambulatory Visit (HOSPITAL_COMMUNITY): Payer: Self-pay | Admitting: Maternal and Fetal Medicine

## 2016-10-15 ENCOUNTER — Ambulatory Visit (HOSPITAL_COMMUNITY)
Admission: RE | Admit: 2016-10-15 | Discharge: 2016-10-15 | Disposition: A | Discharge status: Home / Self Care | Payer: Medicaid Other | Source: Ambulatory Visit | Attending: Obstetrics and Gynecology | Admitting: Obstetrics and Gynecology

## 2016-10-15 DIAGNOSIS — Z362 Encounter for other antenatal screening follow-up: Secondary | ICD-10-CM

## 2016-10-15 DIAGNOSIS — O30042 Twin pregnancy, dichorionic/diamniotic, second trimester: Secondary | ICD-10-CM

## 2016-10-15 DIAGNOSIS — O322XX0 Maternal care for transverse and oblique lie, not applicable or unspecified: Secondary | ICD-10-CM | POA: Insufficient documentation

## 2016-10-15 DIAGNOSIS — Z3A22 22 weeks gestation of pregnancy: Secondary | ICD-10-CM

## 2016-10-15 DIAGNOSIS — O30032 Twin pregnancy, monochorionic/diamniotic, second trimester: Secondary | ICD-10-CM | POA: Diagnosis not present

## 2016-11-06 ENCOUNTER — Ambulatory Visit (INDEPENDENT_AMBULATORY_CARE_PROVIDER_SITE_OTHER): Payer: Medicaid Other | Admitting: Obstetrics and Gynecology

## 2016-11-06 VITALS — BP 112/75 | HR 105 | Wt 173.7 lb

## 2016-11-06 DIAGNOSIS — Z3009 Encounter for other general counseling and advice on contraception: Secondary | ICD-10-CM | POA: Insufficient documentation

## 2016-11-06 DIAGNOSIS — O0990 Supervision of high risk pregnancy, unspecified, unspecified trimester: Secondary | ICD-10-CM

## 2016-11-06 DIAGNOSIS — O30042 Twin pregnancy, dichorionic/diamniotic, second trimester: Secondary | ICD-10-CM

## 2016-11-06 DIAGNOSIS — O0992 Supervision of high risk pregnancy, unspecified, second trimester: Secondary | ICD-10-CM

## 2016-11-06 DIAGNOSIS — O34219 Maternal care for unspecified type scar from previous cesarean delivery: Secondary | ICD-10-CM | POA: Diagnosis not present

## 2016-11-06 DIAGNOSIS — IMO0002 Reserved for concepts with insufficient information to code with codable children: Secondary | ICD-10-CM

## 2016-11-06 NOTE — Progress Notes (Signed)
Subjective:  Barbara Todd is a 36 y.o. [id number] at 9564w6d being seen today for ongoing prenatal care.  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  Patient reports no complaints.  Contractions: Not present. Vag. Bleeding: None.  Movement: Present. Denies leaking of fluid.   The following portions of the patient's history were reviewed and updated as appropriate: allergies, current medications, past family history, past medical history, past social history, past surgical history and problem list. Problem list updated.  Objective:   Vitals:   11/06/16 0833  BP: 112/75  Pulse: (!) 105  Weight: 173 lb 11.2 oz (78.8 kg)    Fetal Status: Fetal Heart Rate (bpm): 138/142   Movement: Present     General:  Alert, oriented and cooperative. Patient is in no acute distress.  Skin: Skin is warm and dry. No rash noted.   Cardiovascular: Normal heart rate noted  Respiratory: Normal respiratory effort, no problems with respiration noted  Abdomen: Soft, gravid, appropriate for gestational age. Pain/Pressure: Absent     Pelvic:  Cervical exam deferred        Extremities: Normal range of motion.  Edema: None  Mental Status: Normal mood and affect. Normal behavior. Normal judgment and thought content.   Urinalysis:      Assessment and Plan:  Pregnancy: [id number] at 4464w6d  1. Supervision of high risk pregnancy, antepartum Stable Glucola next visit 2. Previous cesarean section complicating pregnancy, antepartum condition or complication Desire for RLTCS, papers signed today  3. Dizygotic twins Stable. Good growth. F/U growth scan already order  4. Unwanted fertility BTL papers signed  today  Preterm labor symptoms and general obstetric precautions including but not limited to vaginal bleeding, contractions, leaking of fluid and fetal movement were reviewed in detail with the patient. Please refer to After Visit Summary for other counseling recommendations.  Return in about 3 weeks (around 11/27/2016) for OB visit.   Barbara Todd, [name], MD

## 2016-11-06 NOTE — Progress Notes (Signed)
Patient reports good fetal movement, denies pain. 

## 2016-11-12 ENCOUNTER — Ambulatory Visit (HOSPITAL_COMMUNITY)
Admission: RE | Admit: 2016-11-12 | Discharge: 2016-11-12 | Disposition: A | Discharge status: Home / Self Care | Payer: Medicaid Other | Source: Ambulatory Visit | Attending: Obstetrics and Gynecology | Admitting: Obstetrics and Gynecology

## 2016-11-12 ENCOUNTER — Other Ambulatory Visit (HOSPITAL_COMMUNITY): Payer: Self-pay | Admitting: *Deleted

## 2016-11-12 ENCOUNTER — Encounter (HOSPITAL_COMMUNITY): Payer: Self-pay

## 2016-11-12 DIAGNOSIS — Z3A26 26 weeks gestation of pregnancy: Secondary | ICD-10-CM | POA: Diagnosis not present

## 2016-11-12 DIAGNOSIS — O30043 Twin pregnancy, dichorionic/diamniotic, third trimester: Secondary | ICD-10-CM

## 2016-11-12 DIAGNOSIS — O30042 Twin pregnancy, dichorionic/diamniotic, second trimester: Secondary | ICD-10-CM | POA: Diagnosis not present

## 2016-11-12 DIAGNOSIS — O34219 Maternal care for unspecified type scar from previous cesarean delivery: Secondary | ICD-10-CM | POA: Diagnosis not present

## 2016-11-12 NOTE — Addendum Note (Signed)
Encounter addended by: Aundra MilletKiser, Mike Berntsen E on: 11/12/2016 10:19 AM<BR>    Actions taken: Imaging Exam ended

## 2016-11-27 ENCOUNTER — Ambulatory Visit (INDEPENDENT_AMBULATORY_CARE_PROVIDER_SITE_OTHER): Payer: Medicaid Other | Admitting: Obstetrics and Gynecology

## 2016-11-27 ENCOUNTER — Other Ambulatory Visit: Payer: Self-pay

## 2016-11-27 VITALS — BP 107/71 | HR 103 | Wt 178.0 lb

## 2016-11-27 DIAGNOSIS — O0993 Supervision of high risk pregnancy, unspecified, third trimester: Secondary | ICD-10-CM | POA: Diagnosis not present

## 2016-11-27 DIAGNOSIS — Z3009 Encounter for other general counseling and advice on contraception: Secondary | ICD-10-CM

## 2016-11-27 DIAGNOSIS — O34219 Maternal care for unspecified type scar from previous cesarean delivery: Secondary | ICD-10-CM

## 2016-11-27 DIAGNOSIS — IMO0002 Reserved for concepts with insufficient information to code with codable children: Secondary | ICD-10-CM

## 2016-11-27 DIAGNOSIS — O0990 Supervision of high risk pregnancy, unspecified, unspecified trimester: Secondary | ICD-10-CM

## 2016-11-27 NOTE — Addendum Note (Signed)
Addended by: Anell BarrHOWARD, JENNIFER L on: 11/27/2016 10:03 AM   Modules accepted: Orders

## 2016-11-27 NOTE — Progress Notes (Signed)
Subjective:  Barbara Todd is a 36 y.o. [id number] at 1873w6d being seen today for ongoing prenatal care.  She is currently monitored for the following issues for this high-risk pregnancy and has Previous cesarean section complicating pregnancy, antepartum condition or complication; Supervision of high risk pregnancy, antepartum; Dizygotic twins; and Unwanted fertility on her problem list.  Patient reports no complaints.  Contractions: Not present. Vag. Bleeding: None.  Movement: Present. Denies leaking of fluid.   The following portions of the patient's history were reviewed and updated as appropriate: allergies, current medications, past family history, past medical history, past social history, past surgical history and problem list. Problem list updated.  Objective:   Vitals:   11/27/16 0816  BP: 107/71  Pulse: (!) 103  Weight: 178 lb (80.7 kg)    Fetal Status: Fetal Heart Rate (bpm): 137/148   Movement: Present     General:  Alert, oriented and cooperative. Patient is in no acute distress.  Skin: Skin is warm and dry. No rash noted.   Cardiovascular: Normal heart rate noted  Respiratory: Normal respiratory effort, no problems with respiration noted  Abdomen: Soft, gravid, appropriate for gestational age. Pain/Pressure: Absent     Pelvic:  Cervical exam deferred        Extremities: Normal range of motion.  Edema: None  Mental Status: Normal mood and affect. Normal behavior. Normal judgment and thought content.   Urinalysis:      Assessment and Plan:  Pregnancy: [id number] at 4273w6d  1. Unwanted fertility Papers signed  2. Dizygotic twins Good growth. F/U growth scan scheduled Will start antenatal testing at 32 weeks  3. Supervision of high risk pregnancy, antepartum Glucola today  4. Previous cesarean section complicating pregnancy, antepartum condition or complication Desires for repeat  Preterm labor symptoms and general obstetric precautions including but not limited to  vaginal bleeding, contractions, leaking of fluid and fetal movement were reviewed in detail with the patient. Please refer to After Visit Summary for other counseling recommendations.  Return in about 2 weeks (around 12/11/2016) for OB visit.   Hermina StaggersErvin, Bailey Faiella L, MD

## 2016-11-27 NOTE — Addendum Note (Signed)
Addended by: Anell BarrHOWARD, Lakai Moree L on: 11/27/2016 10:22 AM   Modules accepted: Orders

## 2016-11-28 LAB — CBC
Hematocrit: 34.5 % (ref 34.0–46.6)
Hemoglobin: 11.8 g/dL (ref 11.1–15.9)
MCH: 31.8 pg (ref 26.6–33.0)
MCHC: 34.2 g/dL (ref 31.5–35.7)
MCV: 93 fL (ref 79–97)
PLATELETS: 230 10*3/uL (ref 150–379)
RBC: 3.71 x10E6/uL — ABNORMAL LOW (ref 3.77–5.28)
RDW: 14.6 % (ref 12.3–15.4)
WBC: 11.3 10*3/uL — AB (ref 3.4–10.8)

## 2016-11-28 LAB — GLUCOSE TOLERANCE, 2 HOURS W/ 1HR
GLUCOSE, 1 HOUR: 194 mg/dL — AB (ref 65–179)
GLUCOSE, 2 HOUR: 185 mg/dL — AB (ref 65–152)
Glucose, Fasting: 88 mg/dL (ref 65–91)

## 2016-11-28 LAB — RPR: RPR Ser Ql: NONREACTIVE

## 2016-11-28 LAB — HIV ANTIBODY (ROUTINE TESTING W REFLEX): HIV SCREEN 4TH GENERATION: NONREACTIVE

## 2016-11-29 ENCOUNTER — Encounter: Payer: Self-pay | Admitting: Obstetrics

## 2016-12-04 ENCOUNTER — Encounter: Payer: Self-pay | Admitting: Obstetrics

## 2016-12-04 ENCOUNTER — Telehealth: Payer: Self-pay

## 2016-12-04 DIAGNOSIS — O0990 Supervision of high risk pregnancy, unspecified, unspecified trimester: Secondary | ICD-10-CM

## 2016-12-04 MED ORDER — ACCU-CHEK FASTCLIX LANCETS MISC: 1.0000 | Freq: Four times a day (QID) | 12 refills | Status: DC

## 2016-12-04 MED ORDER — ACCU-CHEK GUIDE W/DEVICE KIT: 1.0000 | PACK | Freq: Four times a day (QID) | 0 refills | Status: DC

## 2016-12-04 MED ORDER — GLUCOSE BLOOD VI STRP: ORAL_STRIP | 12 refills | Status: DC

## 2016-12-04 NOTE — Telephone Encounter (Signed)
Contacted pt to advise of results, management and referral, no answer, left vm

## 2016-12-04 NOTE — Telephone Encounter (Signed)
Patient returned call, advised of results, management and referrals.

## 2016-12-11 ENCOUNTER — Other Ambulatory Visit (HOSPITAL_COMMUNITY): Payer: Self-pay | Admitting: *Deleted

## 2016-12-11 ENCOUNTER — Ambulatory Visit (HOSPITAL_COMMUNITY)
Admission: RE | Admit: 2016-12-11 | Discharge: 2016-12-11 | Disposition: A | Discharge status: Home / Self Care | Payer: Medicaid Other | Source: Ambulatory Visit | Attending: Obstetrics and Gynecology | Admitting: Obstetrics and Gynecology

## 2016-12-11 DIAGNOSIS — Z3A30 30 weeks gestation of pregnancy: Secondary | ICD-10-CM | POA: Insufficient documentation

## 2016-12-11 DIAGNOSIS — O30043 Twin pregnancy, dichorionic/diamniotic, third trimester: Secondary | ICD-10-CM

## 2016-12-11 DIAGNOSIS — O34219 Maternal care for unspecified type scar from previous cesarean delivery: Secondary | ICD-10-CM | POA: Diagnosis not present

## 2016-12-11 DIAGNOSIS — O24419 Gestational diabetes mellitus in pregnancy, unspecified control: Secondary | ICD-10-CM | POA: Diagnosis not present

## 2016-12-11 DIAGNOSIS — O0990 Supervision of high risk pregnancy, unspecified, unspecified trimester: Secondary | ICD-10-CM

## 2016-12-12 ENCOUNTER — Encounter: Payer: Self-pay | Admitting: Skilled Nursing Facility1

## 2016-12-12 ENCOUNTER — Encounter: Payer: Medicaid Other | Attending: Obstetrics and Gynecology | Admitting: Skilled Nursing Facility1

## 2016-12-12 ENCOUNTER — Encounter: Payer: Self-pay | Admitting: Obstetrics & Gynecology

## 2016-12-12 DIAGNOSIS — O0990 Supervision of high risk pregnancy, unspecified, unspecified trimester: Secondary | ICD-10-CM | POA: Insufficient documentation

## 2016-12-12 DIAGNOSIS — Z3A00 Weeks of gestation of pregnancy not specified: Secondary | ICD-10-CM | POA: Insufficient documentation

## 2016-12-12 DIAGNOSIS — Z713 Dietary counseling and surveillance: Secondary | ICD-10-CM | POA: Insufficient documentation

## 2016-12-12 DIAGNOSIS — O24419 Gestational diabetes mellitus in pregnancy, unspecified control: Secondary | ICD-10-CM

## 2016-12-12 NOTE — Progress Notes (Signed)
  Patient was seen on 12/12/2016 for Gestational Diabetes self-management class at the [facility]. The following learning objectives were met by the patient during this course:   States the definition of Gestational Diabetes  States why dietary management is important in controlling blood glucose  Describes the effects each nutrient has on blood glucose levels  Demonstrates ability to create a balanced meal plan  Demonstrates carbohydrate counting   States when to check blood glucose levels involving a total of 4 separate occurences in a day  Demonstrates proper blood glucose monitoring techniques  States the effect of stress and exercise on blood glucose levels  States the importance of limiting caffeine and abstaining from alcohol and smoking  Demonstrates the knowledge the glucometer provided in class may not be covered by their insurance and to call their insurance provider immediately after class to know which glucometer their insurance provider does cover as well as calling their physician the next day for a prescription to the glucometer their insurance does cover (if the one provided is not) as well as the lancets and strips for that meter.   Blood glucose reading: 182: post-prandial with toast and jam  Patient instructed to monitor glucose levels: FBS: 60 - <90 1 hour: <140 2 hour: <120  *Patient received handouts:  Nutrition Diabetes and Pregnancy  Carbohydrate Counting List  Patient will be seen for follow-up as needed.

## 2016-12-19 ENCOUNTER — Ambulatory Visit (INDEPENDENT_AMBULATORY_CARE_PROVIDER_SITE_OTHER): Payer: Medicaid Other | Admitting: Obstetrics & Gynecology

## 2016-12-19 VITALS — BP 124/77 | HR 102 | Wt 181.0 lb

## 2016-12-19 DIAGNOSIS — Z23 Encounter for immunization: Secondary | ICD-10-CM

## 2016-12-19 DIAGNOSIS — O34219 Maternal care for unspecified type scar from previous cesarean delivery: Secondary | ICD-10-CM

## 2016-12-19 DIAGNOSIS — O0990 Supervision of high risk pregnancy, unspecified, unspecified trimester: Secondary | ICD-10-CM

## 2016-12-19 DIAGNOSIS — IMO0002 Reserved for concepts with insufficient information to code with codable children: Secondary | ICD-10-CM

## 2016-12-19 DIAGNOSIS — O24919 Unspecified diabetes mellitus in pregnancy, unspecified trimester: Secondary | ICD-10-CM

## 2016-12-19 DIAGNOSIS — O0993 Supervision of high risk pregnancy, unspecified, third trimester: Secondary | ICD-10-CM

## 2016-12-19 NOTE — Progress Notes (Signed)
   PRENATAL VISIT NOTE  Subjective:  Barbara Todd is a 36 y.o. [id number] at 7855w0d being seen today for ongoing prenatal care.  She is currently monitored for the following issues for this high-risk pregnancy and has Previous cesarean section complicating pregnancy, antepartum condition or complication; Supervision of high risk pregnancy, antepartum; Dizygotic twins; and Unwanted fertility on her problem list.  Patient reports no complaints.  Contractions: Irregular. Vag. Bleeding: None.  Movement: Present. Denies leaking of fluid.   The following portions of the patient's history were reviewed and updated as appropriate: allergies, current medications, past family history, past medical history, past social history, past surgical history and problem list. Problem list updated.  Objective:   Vitals:   12/19/16 0854  BP: 124/77  Pulse: (!) 102  Weight: 181 lb (82.1 kg)    Fetal Status:     Movement: Present     General:  Alert, oriented and cooperative. Patient is in no acute distress.  Skin: Skin is warm and dry. No rash noted.   Cardiovascular: Normal heart rate noted  Respiratory: Normal respiratory effort, no problems with respiration noted  Abdomen: Soft, gravid, appropriate for gestational age.  Pain/Pressure: Absent     Pelvic: Cervical exam deferred        Extremities: Normal range of motion.  Edema: None  Mental Status:  Normal mood and affect. Normal behavior. Normal judgment and thought content.   Assessment and Plan:  Pregnancy: [id number] at 4155w0d  1. Supervision of high risk pregnancy, antepartum Diet controlled DM, good control  2. Dizygotic twins F/u us is scheduled  3. Previous cesarean section complicating pregnancy, antepartum condition or complication Repeat at 38 weeks  Preterm labor symptoms and general obstetric precautions including but not limited to vaginal bleeding, contractions, leaking of fluid and fetal movement were reviewed in detail with the  patient. Please refer to After Visit Summary for other counseling recommendations.  Return in about 2 weeks (around 01/02/2017).   Scheryl DarterJames Ermalee Mealy, MD

## 2016-12-19 NOTE — Patient Instructions (Signed)
Multiple Pregnancy Having a multiple pregnancy means that a woman is carrying more than one baby at a time. She may be pregnant with twins, triplets, or more. The majority of multiple pregnancies are twins. Naturally conceiving triplets or more (higher-order multiples) is rare. Multiple pregnancies are riskier than single pregnancies. A woman with a multiple pregnancy is more likely to have certain problems during her pregnancy. Therefore, she will need to have more frequent appointments for prenatal care. How does a multiple pregnancy happen? A multiple pregnancy happens when:  The woman's body releases more than one egg at a time, and then each egg gets fertilized by a different sperm. ? This is the most common type of multiple pregnancy. ? Twins or other multiples produced this way are fraternal. They are no more alike than non-multiple siblings are.  One sperm fertilizes one egg, which then divides into more than one embryo. ? Twins or other multiples produced this way are identical. Identical multiples are always the same gender, and they look very much alike.  Who is most likely to have a multiple pregnancy? A multiple pregnancy is more likely to develop in women who:  Have had fertility treatment, especially if the treatment included fertility drugs.  Are older than 34 years of age.  Have already had four or more children.  Have a family history of multiple pregnancy.  How is a multiple pregnancy diagnosed? A multiple pregnancy may be diagnosed based on:  Symptoms such as: ? Rapid weight gain in the first 3 months of pregnancy (first trimester). ? More severe nausea and breast tenderness than what is typical of a single pregnancy. ? The uterus measuring larger than what is normal for the stage of the pregnancy.  Blood tests that detect a higher-than-normal level of human chorionic gonadotropin (hCG). This is a hormone that your body produces in early pregnancy.  Ultrasound  exam. This is used to confirm that you are carrying multiples.  What risks are associated with multiple pregnancy? A multiple pregnancy puts you at a higher risk for certain problems during or after your pregnancy, including:  Having your babies delivered before you have reached a full-term pregnancy (preterm birth). A full-term pregnancy lasts for at least 37 weeks. Babies born before 53 weeks may have a higher risk of a variety of health problems, such as breathing problems, feeding difficulties, cerebral palsy, and learning disabilities.  Diabetes.  Preeclampsia. This is a serious condition that causes high blood pressure along with other symptoms, such as swelling and headaches, during pregnancy.  Excessive blood loss after childbirth (postpartum hemorrhage).  Postpartum depression.  Low birth weight of the babies.  How will having a multiple pregnancy affect my care? Your health care provider will want to monitor you more closely during your pregnancy to make sure that your babies are growing normally and that you are healthy. Follow these instructions at home: Because your pregnancy is considered to be high risk, you will need to work closely with your health care team. You may also need to make some lifestyle changes. These may include the following: Eating and drinking  Increase your nutrition. ? Follow your health care provider's recommendations for weight gain. You may need to gain a little extra weight when you are pregnant with multiples. ? Eat healthy snacks often throughout the day. This can add calories and reduce nausea.  Drink enough fluid to keep your urine clear or pale yellow.  Take prenatal vitamins. Activity By 20-24 weeks, you may  need to limit your activities.  Avoid activities and work that take a lot of effort (are strenuous).  Ask your health care provider when you should stop having sexual intercourse.  Rest often.  General instructions  Do not use  any products that contain nicotine or tobacco, such as cigarettes and e-cigarettes. If you need help quitting, ask your health care provider.  Do not drink alcohol or use illegal drugs.  Take over-the-counter and prescription medicines only as told by your health care provider.  Arrange for extra help around the house.  Keep all follow-up visits and all prenatal visits as told by your health care provider. This is important. Contact a health care provider if:  You have dizziness.  You have persistent nausea, vomiting, or diarrhea.  You are having trouble gaining weight.  You have feelings of depression or other emotions that are interfering with your normal activities. Get help right away if:  You have a fever.  You have pain with urination.  You have fluid leaking from your vagina.  You have a bad-smelling vaginal discharge.  You notice increased swelling in your face, hands, legs, or ankles.  You have spotting or bleeding from your vagina.  You have pelvic cramps, pelvic pressure, or nagging pain in your abdomen or lower back.  You are having regular contractions.  You develop a severe headache, with or without visual changes.  You have shortness of breath or chest pain.  You notice less fetal movement, or no fetal movement. This information is not intended to replace advice given to you by your health care provider. Make sure you discuss any questions you have with your health care provider. Document Released: 02/07/2008 Document Revised: 12/30/2015 Document Reviewed: 12/30/2015 Elsevier Interactive Patient Education  2018 [employer]  

## 2016-12-31 ENCOUNTER — Ambulatory Visit (INDEPENDENT_AMBULATORY_CARE_PROVIDER_SITE_OTHER): Payer: Medicaid Other | Admitting: Obstetrics and Gynecology

## 2016-12-31 ENCOUNTER — Other Ambulatory Visit: Payer: Self-pay | Admitting: Obstetrics and Gynecology

## 2016-12-31 ENCOUNTER — Encounter (HOSPITAL_COMMUNITY): Payer: Self-pay

## 2016-12-31 VITALS — BP 110/73 | HR 85 | Wt 180.0 lb

## 2016-12-31 DIAGNOSIS — IMO0002 Reserved for concepts with insufficient information to code with codable children: Secondary | ICD-10-CM

## 2016-12-31 DIAGNOSIS — O24913 Unspecified diabetes mellitus in pregnancy, third trimester: Secondary | ICD-10-CM

## 2016-12-31 DIAGNOSIS — O0993 Supervision of high risk pregnancy, unspecified, third trimester: Secondary | ICD-10-CM

## 2016-12-31 DIAGNOSIS — O24919 Unspecified diabetes mellitus in pregnancy, unspecified trimester: Secondary | ICD-10-CM

## 2016-12-31 DIAGNOSIS — O0990 Supervision of high risk pregnancy, unspecified, unspecified trimester: Secondary | ICD-10-CM

## 2016-12-31 DIAGNOSIS — O34219 Maternal care for unspecified type scar from previous cesarean delivery: Secondary | ICD-10-CM

## 2016-12-31 NOTE — Progress Notes (Signed)
   PRENATAL VISIT NOTE  Subjective:  Barbara Todd is a 36 y.o. [phone] at [redacted]w[redacted]d being seen today for ongoing prenatal care.  She is currently monitored for the following issues for this high-risk pregnancy and has Previous cesarean section complicating pregnancy, antepartum condition or complication; Supervision of high risk pregnancy, antepartum; Dizygotic twins; Unwanted fertility; and Diabetes mellitus affecting pregnancy, antepartum on her problem list.  Patient reports no complaints.  Contractions: Irregular. Vag. Bleeding: None.  Movement: Present. Denies leaking of fluid.   The following portions of the patient's history were reviewed and updated as appropriate: allergies, current medications, past family history, past medical history, past social history, past surgical history and problem list. Problem list updated.  Objective:   Vitals:   12/31/16 1307  BP: 110/73  Pulse: 85  Weight: 180 lb (81.6 kg)    Fetal Status: Fetal Heart Rate (bpm): 125/130   Movement: Present     General:  Alert, oriented and cooperative. Patient is in no acute distress.  Skin: Skin is warm and dry. No rash noted.   Cardiovascular: Normal heart rate noted  Respiratory: Normal respiratory effort, no problems with respiration noted  Abdomen: Soft, gravid, appropriate for gestational age.  Pain/Pressure: Absent     Pelvic: Cervical exam deferred        Extremities: Normal range of motion.     Mental Status:  Normal mood and affect. Normal behavior. Normal judgment and thought content.   Assessment and Plan:  Pregnancy: V6P7948 at [redacted]w[redacted]d  1. Diabetes mellitus affecting pregnancy, antepartum CBGs reviewed and all within range Continue diet control  2. Dizygotic twins Growth ultrasound scheduled on 8/28 Will start fetal testing at 35 weeks  3. Previous cesarean section complicating pregnancy, antepartum condition or complication Scheduled for repeat with BTL at 38 weeks  4. Supervision of high  risk pregnancy, antepartum Patient is doing well without complaints  Preterm labor symptoms and general obstetric precautions including but not limited to vaginal bleeding, contractions, leaking of fluid and fetal movement were reviewed in detail with the patient. Please refer to After Visit Summary for other counseling recommendations.  Return in about 2 weeks (around 01/14/2017) for ROB, NST.   Catalina Antigua, MD

## 2017-01-01 NOTE — Addendum Note (Signed)
Addended by: Catalina Antigua on: 01/01/2017 04:41 PM   Modules accepted: Orders

## 2017-01-02 ENCOUNTER — Encounter: Payer: Medicaid Other | Admitting: Obstetrics and Gynecology

## 2017-01-03 ENCOUNTER — Other Ambulatory Visit: Payer: Self-pay | Admitting: Family Medicine

## 2017-01-03 ENCOUNTER — Other Ambulatory Visit: Payer: Self-pay

## 2017-01-03 DIAGNOSIS — O0990 Supervision of high risk pregnancy, unspecified, unspecified trimester: Secondary | ICD-10-CM

## 2017-01-08 ENCOUNTER — Ambulatory Visit (HOSPITAL_COMMUNITY)
Admission: RE | Admit: 2017-01-08 | Discharge: 2017-01-08 | Disposition: A | Discharge status: Home / Self Care | Payer: Medicaid Other | Source: Ambulatory Visit | Attending: Obstetrics and Gynecology | Admitting: Obstetrics and Gynecology

## 2017-01-08 ENCOUNTER — Other Ambulatory Visit (HOSPITAL_COMMUNITY): Payer: Self-pay | Admitting: *Deleted

## 2017-01-08 ENCOUNTER — Other Ambulatory Visit (HOSPITAL_COMMUNITY): Payer: Self-pay | Admitting: Obstetrics and Gynecology

## 2017-01-08 ENCOUNTER — Other Ambulatory Visit: Payer: Self-pay | Admitting: Obstetrics and Gynecology

## 2017-01-08 ENCOUNTER — Ambulatory Visit (HOSPITAL_COMMUNITY): Payer: Medicaid Other

## 2017-01-08 ENCOUNTER — Encounter (HOSPITAL_COMMUNITY): Payer: Self-pay

## 2017-01-08 DIAGNOSIS — O24410 Gestational diabetes mellitus in pregnancy, diet controlled: Secondary | ICD-10-CM | POA: Diagnosis not present

## 2017-01-08 DIAGNOSIS — O34219 Maternal care for unspecified type scar from previous cesarean delivery: Secondary | ICD-10-CM

## 2017-01-08 DIAGNOSIS — O0993 Supervision of high risk pregnancy, unspecified, third trimester: Secondary | ICD-10-CM | POA: Diagnosis not present

## 2017-01-08 DIAGNOSIS — Z3A34 34 weeks gestation of pregnancy: Secondary | ICD-10-CM

## 2017-01-08 DIAGNOSIS — O30042 Twin pregnancy, dichorionic/diamniotic, second trimester: Secondary | ICD-10-CM | POA: Insufficient documentation

## 2017-01-08 DIAGNOSIS — O30043 Twin pregnancy, dichorionic/diamniotic, third trimester: Secondary | ICD-10-CM

## 2017-01-08 DIAGNOSIS — O0990 Supervision of high risk pregnancy, unspecified, unspecified trimester: Secondary | ICD-10-CM

## 2017-01-08 HISTORY — DX: Gestational diabetes mellitus in pregnancy, unspecified control: O24.419

## 2017-01-09 ENCOUNTER — Other Ambulatory Visit: Payer: Self-pay | Admitting: Obstetrics and Gynecology

## 2017-01-09 DIAGNOSIS — O30043 Twin pregnancy, dichorionic/diamniotic, third trimester: Secondary | ICD-10-CM

## 2017-01-15 ENCOUNTER — Encounter (HOSPITAL_COMMUNITY): Payer: Self-pay

## 2017-01-17 ENCOUNTER — Other Ambulatory Visit (HOSPITAL_COMMUNITY)
Admission: RE | Admit: 2017-01-17 | Discharge: 2017-01-17 | Disposition: A | Discharge status: Home / Self Care | Payer: Medicaid Other | Source: Ambulatory Visit | Attending: Obstetrics and Gynecology | Admitting: Obstetrics and Gynecology

## 2017-01-17 ENCOUNTER — Ambulatory Visit (INDEPENDENT_AMBULATORY_CARE_PROVIDER_SITE_OTHER): Payer: Medicaid Other | Admitting: Obstetrics and Gynecology

## 2017-01-17 VITALS — BP 120/75 | HR 93 | Wt 177.5 lb

## 2017-01-17 DIAGNOSIS — O0990 Supervision of high risk pregnancy, unspecified, unspecified trimester: Secondary | ICD-10-CM

## 2017-01-17 DIAGNOSIS — O0993 Supervision of high risk pregnancy, unspecified, third trimester: Secondary | ICD-10-CM | POA: Diagnosis not present

## 2017-01-17 DIAGNOSIS — Z3009 Encounter for other general counseling and advice on contraception: Secondary | ICD-10-CM

## 2017-01-17 DIAGNOSIS — Z3A36 36 weeks gestation of pregnancy: Secondary | ICD-10-CM | POA: Diagnosis not present

## 2017-01-17 DIAGNOSIS — O24913 Unspecified diabetes mellitus in pregnancy, third trimester: Secondary | ICD-10-CM | POA: Diagnosis not present

## 2017-01-17 DIAGNOSIS — O24919 Unspecified diabetes mellitus in pregnancy, unspecified trimester: Secondary | ICD-10-CM

## 2017-01-17 DIAGNOSIS — O34219 Maternal care for unspecified type scar from previous cesarean delivery: Secondary | ICD-10-CM

## 2017-01-17 DIAGNOSIS — IMO0002 Reserved for concepts with insufficient information to code with codable children: Secondary | ICD-10-CM

## 2017-01-17 NOTE — Progress Notes (Signed)
Subjective:  Barbara Todd is a 36 y.o. [id number] at 2234w1d being seen today for ongoing prenatal care.  She is currently monitored for the following issues for this high-risk pregnancy and has Previous cesarean section complicating pregnancy, antepartum condition or complication; Supervision of high risk pregnancy, antepartum; Dizygotic twins; Unwanted fertility; and Diabetes mellitus affecting pregnancy, antepartum on her problem list.  Patient reports no complaints.  Contractions: Irregular. Vag. Bleeding: None.  Movement: Present. Denies leaking of fluid.   The following portions of the patient's history were reviewed and updated as appropriate: allergies, current medications, past family history, past medical history, past social history, past surgical history and problem list. Problem list updated.  Objective:   Vitals:   01/17/17 0830  BP: 120/75  Pulse: 93  Weight: 177 lb 8 oz (80.5 kg)    Fetal Status: Fetal Heart Rate (bpm): 136/130   Movement: Present     General:  Alert, oriented and cooperative. Patient is in no acute distress.  Skin: Skin is warm and dry. No rash noted.   Cardiovascular: Normal heart rate noted  Respiratory: Normal respiratory effort, no problems with respiration noted  Abdomen: Soft, gravid, appropriate for gestational age. Pain/Pressure: Absent     Pelvic:  Cervical exam deferred        Extremities: Normal range of motion.  Edema: None  Mental Status: Normal mood and affect. Normal behavior. Normal judgment and thought content.   Urinalysis:      Assessment and Plan:  Pregnancy: [id number] at 7334w1d  1. Supervision of high risk pregnancy, antepartum Stable - Strep Gp B NAA - Cervicovaginal ancillary only  2. Diabetes mellitus affecting pregnancy, antepartum BS in goal range on diet  3. Unwanted fertility BTL papers signed  4. Dizygotic twins Stable NST reactive today Continue with twice weekly NST - Fetal nonstress test  5. Previous cesarean  section complicating pregnancy, antepartum condition or complication Repeat schedule with BTL  Term labor symptoms and general obstetric precautions including but not limited to vaginal bleeding, contractions, leaking of fluid and fetal movement were reviewed in detail with the patient. Please refer to After Visit Summary for other counseling recommendations.  Return in about 1 week (around 01/24/2017) for OB visit.   Hermina StaggersErvin, Dayzee Trower L, MD

## 2017-01-18 LAB — CERVICOVAGINAL ANCILLARY ONLY
Chlamydia: NEGATIVE
Neisseria Gonorrhea: NEGATIVE

## 2017-01-19 LAB — STREP GP B NAA: Strep Gp B NAA: NEGATIVE

## 2017-01-22 ENCOUNTER — Other Ambulatory Visit: Payer: Self-pay | Admitting: Obstetrics and Gynecology

## 2017-01-22 ENCOUNTER — Encounter (HOSPITAL_COMMUNITY)
Admission: RE | Admit: 2017-01-22 | Discharge: 2017-01-22 | Disposition: A | Discharge status: Home / Self Care | Payer: Medicaid Other | Source: Ambulatory Visit | Attending: Obstetrics and Gynecology | Admitting: Obstetrics and Gynecology

## 2017-01-22 ENCOUNTER — Encounter (HOSPITAL_COMMUNITY): Payer: Self-pay

## 2017-01-22 ENCOUNTER — Other Ambulatory Visit: Payer: Self-pay | Admitting: Family Medicine

## 2017-01-22 ENCOUNTER — Ambulatory Visit (HOSPITAL_COMMUNITY)
Admission: RE | Admit: 2017-01-22 | Discharge: 2017-01-22 | Disposition: A | Discharge status: Home / Self Care | Payer: Medicaid Other | Source: Ambulatory Visit | Attending: Obstetrics and Gynecology | Admitting: Obstetrics and Gynecology

## 2017-01-22 DIAGNOSIS — O30043 Twin pregnancy, dichorionic/diamniotic, third trimester: Secondary | ICD-10-CM

## 2017-01-22 DIAGNOSIS — O34219 Maternal care for unspecified type scar from previous cesarean delivery: Secondary | ICD-10-CM

## 2017-01-22 DIAGNOSIS — O24410 Gestational diabetes mellitus in pregnancy, diet controlled: Secondary | ICD-10-CM

## 2017-01-22 DIAGNOSIS — Z3A36 36 weeks gestation of pregnancy: Secondary | ICD-10-CM

## 2017-01-22 LAB — CBC
HCT: 38.2 % (ref 36.0–46.0)
Hemoglobin: 13.2 g/dL (ref 12.0–15.0)
MCH: 30.8 pg (ref 26.0–34.0)
MCHC: 34.6 g/dL (ref 30.0–36.0)
MCV: 89.3 fL (ref 78.0–100.0)
PLATELETS: 349 10*3/uL (ref 150–400)
RBC: 4.28 MIL/uL (ref 3.87–5.11)
RDW: 13.7 % (ref 11.5–15.5)
WBC: 11 10*3/uL — ABNORMAL HIGH (ref 4.0–10.5)

## 2017-01-22 LAB — COMPREHENSIVE METABOLIC PANEL
ALK PHOS: 295 U/L — AB (ref 38–126)
ALT: 24 U/L (ref 14–54)
ANION GAP: 10 (ref 5–15)
AST: 23 U/L (ref 15–41)
Albumin: 2.8 g/dL — ABNORMAL LOW (ref 3.5–5.0)
BILIRUBIN TOTAL: 0.5 mg/dL (ref 0.3–1.2)
BUN: <5 mg/dL — ABNORMAL LOW (ref 6–20)
CALCIUM: 9.1 mg/dL (ref 8.9–10.3)
CO2: 23 mmol/L (ref 22–32)
CREATININE: 0.64 mg/dL (ref 0.44–1.00)
Chloride: 103 mmol/L (ref 101–111)
GFR calc non Af Amer: >60 mL/min (ref >60)
Glucose, Bld: 69 mg/dL (ref 65–99)
Potassium: 3.8 mmol/L (ref 3.5–5.1)
Sodium: 136 mmol/L (ref 135–145)
TOTAL PROTEIN: 8 g/dL (ref 6.5–8.1)

## 2017-01-22 LAB — HEMOGLOBIN A1C
Hgb A1c MFr Bld: 5.3 % (ref 4.8–5.6)
MEAN PLASMA GLUCOSE: 105.41 mg/dL

## 2017-01-22 LAB — TYPE AND SCREEN
ABO/RH(D): O POS
Antibody Screen: NEGATIVE

## 2017-01-22 NOTE — Progress Notes (Signed)
Initial visit with Barbara Todd to introduce spiritual care services and offer support after notification by RN that patient's female twin did not have fetal heart tones.  I spent time with Barbara Todd while she waited for her pastor to arrive and to meet with the MD to determine the next step.  Barbara Todd's emotions cycled from questioning to crying and hand wringing.  She continued state, "what did I do wrong?"  I offered support and reassurance that it was not her fault and space to cry and lament.    Pt will deliver by cesarean tomorrow at 5:00.  She is to return at 3:30.  She has the contact information for the chaplain in case she has additional questions and needs support.  Will notify incoming chaplain for follow up.  Please page as further needs arise.  Barbara Todd, M.Div. [facility] Cha[facility] Pager [phone] Office [phone]      01/22/17 1145  Clinical Encounter Type  Visited With Patient  Visit Type Initial;Spiritual support;Social support  Referral From Nurse  Spiritual Encounters  Spiritual Needs Emotional;Grief support  Stress Factors  Patient Stress Factors Loss

## 2017-01-22 NOTE — Patient Instructions (Signed)
20 [name]  01/22/2017   Your procedure is scheduled on:  04/24/2017  Enter through the Main Entrance of [facility] at 0315 AM.  Pick up the phone at the desk and dial [phone].   Call this number if you have problems the morning of surgery: [phone]   Remember:   Do not eat food:After Midnight.  Do not drink clear liquids: 6 Hours before arrival.  Take these medicines the morning of surgery with A SIP OF WATER: none   Do not wear jewelry, make-up or nail polish.  Do not wear lotions, powders, or perfumes. Do not wear deodorant.  Do not shave 48 hours prior to surgery.  Do not bring valuables to the hospital.  [facility] is not   responsible for any belongings or valuables brought to the hospital.  Contacts, dentures or bridgework may not be worn into surgery.  Leave suitcase in the car. After surgery it may be brought to your room.  For patients admitted to the hospital, checkout time is 11:00 AM the day of              discharge.   Patients discharged the day of surgery will not be allowed to drive             home.  Name and phone number of your driver: na  Special Instructions:   N/A   Please read over the following fact sheets that you were given:   Surgical Site Infection Prevention

## 2017-01-23 ENCOUNTER — Encounter (HOSPITAL_COMMUNITY): Payer: Self-pay | Admitting: *Deleted

## 2017-01-23 ENCOUNTER — Inpatient Hospital Stay (HOSPITAL_COMMUNITY): Payer: Medicaid Other | Admitting: Anesthesiology

## 2017-01-23 ENCOUNTER — Encounter (HOSPITAL_COMMUNITY)
Admission: RE | Disposition: A | Discharge status: Home / Self Care | Payer: Self-pay | Source: Ambulatory Visit | Attending: Maternal & Fetal Medicine

## 2017-01-23 ENCOUNTER — Inpatient Hospital Stay (HOSPITAL_COMMUNITY)
Admission: RE | Admit: 2017-01-23 | Discharge: 2017-01-26 | DRG: 765 | Disposition: A | Discharge status: Home / Self Care | Payer: Medicaid Other | Source: Ambulatory Visit | Attending: Maternal & Fetal Medicine | Admitting: Maternal & Fetal Medicine

## 2017-01-23 DIAGNOSIS — O3123X1 Continuing pregnancy after intrauterine death of one fetus or more, third trimester, fetus 1: Secondary | ICD-10-CM

## 2017-01-23 DIAGNOSIS — IMO0002 Reserved for concepts with insufficient information to code with codable children: Secondary | ICD-10-CM | POA: Diagnosis present

## 2017-01-23 DIAGNOSIS — Z98891 History of uterine scar from previous surgery: Secondary | ICD-10-CM

## 2017-01-23 DIAGNOSIS — O34211 Maternal care for low transverse scar from previous cesarean delivery: Secondary | ICD-10-CM | POA: Diagnosis not present

## 2017-01-23 DIAGNOSIS — O364XX1 Maternal care for intrauterine death, fetus 1: Secondary | ICD-10-CM | POA: Diagnosis present

## 2017-01-23 DIAGNOSIS — Z3A37 37 weeks gestation of pregnancy: Secondary | ICD-10-CM

## 2017-01-23 DIAGNOSIS — O34219 Maternal care for unspecified type scar from previous cesarean delivery: Secondary | ICD-10-CM | POA: Diagnosis present

## 2017-01-23 DIAGNOSIS — O321XX2 Maternal care for breech presentation, fetus 2: Secondary | ICD-10-CM | POA: Diagnosis present

## 2017-01-23 DIAGNOSIS — Z3009 Encounter for other general counseling and advice on contraception: Secondary | ICD-10-CM | POA: Diagnosis present

## 2017-01-23 DIAGNOSIS — O30043 Twin pregnancy, dichorionic/diamniotic, third trimester: Secondary | ICD-10-CM | POA: Diagnosis not present

## 2017-01-23 DIAGNOSIS — O24919 Unspecified diabetes mellitus in pregnancy, unspecified trimester: Secondary | ICD-10-CM | POA: Diagnosis present

## 2017-01-23 LAB — DIC (DISSEMINATED INTRAVASCULAR COAGULATION)PANEL
D-Dimer, Quant: 1.86 ug/mL-FEU — ABNORMAL HIGH (ref 0.00–0.50)
Fibrinogen: 466 mg/dL (ref 210–475)
INR: 0.95
Platelets: 347 10*3/uL (ref 150–400)
Smear Review: NONE SEEN
aPTT: 29 seconds (ref 24–36)

## 2017-01-23 LAB — GLUCOSE, CAPILLARY: GLUCOSE-CAPILLARY: 66 mg/dL (ref 65–99)

## 2017-01-23 LAB — RPR: RPR: NONREACTIVE

## 2017-01-23 LAB — DIC (DISSEMINATED INTRAVASCULAR COAGULATION) PANEL: PROTHROMBIN TIME: 12.6 s (ref 11.4–15.2)

## 2017-01-23 SURGERY — Surgical Case
Anesthesia: Spinal | Site: Abdomen | Laterality: Bilateral | Wound class: Clean Contaminated

## 2017-01-23 SURGERY — Surgical Case
Anesthesia: *Unknown

## 2017-01-23 MED ORDER — ACETAMINOPHEN 325 MG PO TABS
650.0000 mg | ORAL_TABLET | ORAL | Status: DC | PRN
  Administered 2017-01-24 – 2017-01-26 (×4): 650 mg via ORAL
  Filled 2017-01-23 (×4): qty 2

## 2017-01-23 MED ORDER — LACTATED RINGERS IV SOLN
INTRAVENOUS | Status: DC | PRN
  Administered 2017-01-23 (×4): via INTRAVENOUS

## 2017-01-23 MED ORDER — FENTANYL CITRATE (PF) 100 MCG/2ML IJ SOLN
INTRAMUSCULAR | Status: DC | PRN
  Administered 2017-01-23: 40 ug via INTRAVENOUS
  Administered 2017-01-23: 10 ug via INTRATHECAL

## 2017-01-23 MED ORDER — PHENYLEPHRINE 40 MCG/ML (10ML) SYRINGE FOR IV PUSH (FOR BLOOD PRESSURE SUPPORT)
PREFILLED_SYRINGE | INTRAVENOUS | Status: AC
  Filled 2017-01-23: qty 10

## 2017-01-23 MED ORDER — ZOLPIDEM TARTRATE 5 MG PO TABS: 5.0000 mg | ORAL_TABLET | Freq: Every evening | ORAL | Status: DC | PRN

## 2017-01-23 MED ORDER — MIDAZOLAM HCL 2 MG/2ML IJ SOLN
INTRAMUSCULAR | Status: AC
  Filled 2017-01-23: qty 2

## 2017-01-23 MED ORDER — ONDANSETRON HCL 4 MG/2ML IJ SOLN
INTRAMUSCULAR | Status: AC
  Filled 2017-01-23: qty 2

## 2017-01-23 MED ORDER — MORPHINE SULFATE (PF) 0.5 MG/ML IJ SOLN
INTRAMUSCULAR | Status: AC
  Filled 2017-01-23: qty 10

## 2017-01-23 MED ORDER — SODIUM CHLORIDE 0.9% FLUSH: 3.0000 mL | INTRAVENOUS | Status: DC | PRN

## 2017-01-23 MED ORDER — PHENYLEPHRINE HCL 10 MG/ML IJ SOLN
INTRAMUSCULAR | Status: DC | PRN
  Administered 2017-01-23: 80 ug via INTRAVENOUS
  Administered 2017-01-23 (×2): 120 ug via INTRAVENOUS
  Administered 2017-01-23: 40 ug via INTRAVENOUS
  Administered 2017-01-23: 80 ug via INTRAVENOUS
  Administered 2017-01-23: 120 ug via INTRAVENOUS
  Administered 2017-01-23 (×2): 80 ug via INTRAVENOUS

## 2017-01-23 MED ORDER — FENTANYL CITRATE (PF) 100 MCG/2ML IJ SOLN
INTRAMUSCULAR | Status: AC
  Filled 2017-01-23: qty 2

## 2017-01-23 MED ORDER — MEPERIDINE HCL 25 MG/ML IJ SOLN: 6.2500 mg | INTRAMUSCULAR | Status: DC | PRN

## 2017-01-23 MED ORDER — DIBUCAINE 1 % RE OINT: 1.0000 "application " | TOPICAL_OINTMENT | RECTAL | Status: DC | PRN

## 2017-01-23 MED ORDER — OXYCODONE HCL 5 MG PO TABS: 10.0000 mg | ORAL_TABLET | ORAL | Status: DC | PRN

## 2017-01-23 MED ORDER — NALOXONE HCL 2 MG/2ML IJ SOSY: 1.0000 ug/kg/h | PREFILLED_SYRINGE | INTRAVENOUS | Status: DC | PRN

## 2017-01-23 MED ORDER — DIPHENHYDRAMINE HCL 50 MG/ML IJ SOLN
12.5000 mg | INTRAMUSCULAR | Status: DC | PRN
  Administered 2017-01-23: 12.5 mg via INTRAVENOUS

## 2017-01-23 MED ORDER — NALBUPHINE HCL 10 MG/ML IJ SOLN
5.0000 mg | Freq: Once | INTRAMUSCULAR | Status: AC | PRN
  Administered 2017-01-23: 5 mg via INTRAVENOUS

## 2017-01-23 MED ORDER — OXYTOCIN 10 UNIT/ML IJ SOLN
INTRAMUSCULAR | Status: AC
  Filled 2017-01-23: qty 4

## 2017-01-23 MED ORDER — MEPERIDINE HCL 25 MG/ML IJ SOLN
INTRAMUSCULAR | Status: DC | PRN
  Administered 2017-01-23 (×2): 12.5 mg via INTRAVENOUS

## 2017-01-23 MED ORDER — PHENYLEPHRINE 8 MG IN D5W 100 ML (0.08MG/ML) PREMIX OPTIME
INJECTION | INTRAVENOUS | Status: AC
  Filled 2017-01-23: qty 100

## 2017-01-23 MED ORDER — EPHEDRINE SULFATE 50 MG/ML IJ SOLN
INTRAMUSCULAR | Status: DC | PRN
  Administered 2017-01-23: 5 mg via INTRAVENOUS

## 2017-01-23 MED ORDER — PHENYLEPHRINE 8 MG IN D5W 100 ML (0.08MG/ML) PREMIX OPTIME
INJECTION | INTRAVENOUS | Status: DC | PRN
  Administered 2017-01-23: 100 ug/min via INTRAVENOUS

## 2017-01-23 MED ORDER — CEFAZOLIN SODIUM-DEXTROSE 2-4 GM/100ML-% IV SOLN
2.0000 g | Freq: Once | INTRAVENOUS | Status: AC
  Administered 2017-01-23: 2 g via INTRAVENOUS
  Filled 2017-01-23: qty 100

## 2017-01-23 MED ORDER — IBUPROFEN 600 MG PO TABS
600.0000 mg | ORAL_TABLET | Freq: Four times a day (QID) | ORAL | Status: DC
  Administered 2017-01-24 – 2017-01-26 (×10): 600 mg via ORAL
  Filled 2017-01-23 (×9): qty 1

## 2017-01-23 MED ORDER — DEXAMETHASONE SODIUM PHOSPHATE 4 MG/ML IJ SOLN
INTRAMUSCULAR | Status: AC
  Filled 2017-01-23: qty 1

## 2017-01-23 MED ORDER — COCONUT OIL OIL
1.0000 "application " | TOPICAL_OIL | Status: DC | PRN
  Filled 2017-01-23: qty 120

## 2017-01-23 MED ORDER — ONDANSETRON HCL 4 MG/2ML IJ SOLN
INTRAMUSCULAR | Status: DC | PRN
  Administered 2017-01-23: 4 mg via INTRAVENOUS

## 2017-01-23 MED ORDER — MEPERIDINE HCL 25 MG/ML IJ SOLN
INTRAMUSCULAR | Status: AC
  Filled 2017-01-23: qty 1

## 2017-01-23 MED ORDER — SIMETHICONE 80 MG PO CHEW
80.0000 mg | CHEWABLE_TABLET | ORAL | Status: DC
  Administered 2017-01-24 – 2017-01-25 (×3): 80 mg via ORAL
  Filled 2017-01-23 (×3): qty 1

## 2017-01-23 MED ORDER — BUPIVACAINE IN DEXTROSE 0.75-8.25 % IT SOLN
INTRATHECAL | Status: AC
  Filled 2017-01-23: qty 2

## 2017-01-23 MED ORDER — DIPHENHYDRAMINE HCL 25 MG PO CAPS: 25.0000 mg | ORAL_CAPSULE | ORAL | Status: DC | PRN

## 2017-01-23 MED ORDER — SENNOSIDES-DOCUSATE SODIUM 8.6-50 MG PO TABS
2.0000 | ORAL_TABLET | ORAL | Status: DC
  Administered 2017-01-24 – 2017-01-25 (×3): 2 via ORAL
  Filled 2017-01-23 (×3): qty 2

## 2017-01-23 MED ORDER — SODIUM CHLORIDE 0.9 % IR SOLN
Status: DC | PRN
  Administered 2017-01-23: 1000 mL

## 2017-01-23 MED ORDER — DIPHENHYDRAMINE HCL 50 MG/ML IJ SOLN
INTRAMUSCULAR | Status: AC
  Filled 2017-01-23: qty 1

## 2017-01-23 MED ORDER — SIMETHICONE 80 MG PO CHEW
80.0000 mg | CHEWABLE_TABLET | Freq: Three times a day (TID) | ORAL | Status: DC
  Administered 2017-01-24 – 2017-01-26 (×6): 80 mg via ORAL
  Filled 2017-01-23 (×7): qty 1

## 2017-01-23 MED ORDER — NALBUPHINE HCL 10 MG/ML IJ SOLN: 5.0000 mg | Freq: Once | INTRAMUSCULAR | Status: AC | PRN

## 2017-01-23 MED ORDER — WITCH HAZEL-GLYCERIN EX PADS: 1.0000 "application " | MEDICATED_PAD | CUTANEOUS | Status: DC | PRN

## 2017-01-23 MED ORDER — NALBUPHINE HCL 10 MG/ML IJ SOLN: 5.0000 mg | INTRAMUSCULAR | Status: DC | PRN

## 2017-01-23 MED ORDER — OXYTOCIN 40 UNITS IN LACTATED RINGERS INFUSION - SIMPLE MED: 2.5000 [IU]/h | INTRAVENOUS | Status: AC

## 2017-01-23 MED ORDER — KETOROLAC TROMETHAMINE 30 MG/ML IJ SOLN: 30.0000 mg | Freq: Four times a day (QID) | INTRAMUSCULAR | Status: DC | PRN

## 2017-01-23 MED ORDER — MENTHOL 3 MG MT LOZG: 1.0000 | LOZENGE | OROMUCOSAL | Status: DC | PRN

## 2017-01-23 MED ORDER — ONDANSETRON HCL 4 MG/2ML IJ SOLN: 4.0000 mg | Freq: Three times a day (TID) | INTRAMUSCULAR | Status: DC | PRN

## 2017-01-23 MED ORDER — OXYCODONE HCL 5 MG PO TABS: 5.0000 mg | ORAL_TABLET | ORAL | Status: DC | PRN

## 2017-01-23 MED ORDER — SIMETHICONE 80 MG PO CHEW: 80.0000 mg | CHEWABLE_TABLET | ORAL | Status: DC | PRN

## 2017-01-23 MED ORDER — PRENATAL MULTIVITAMIN CH
1.0000 | ORAL_TABLET | Freq: Every day | ORAL | Status: DC
  Administered 2017-01-24 – 2017-01-25 (×2): 1 via ORAL
  Filled 2017-01-23 (×2): qty 1

## 2017-01-23 MED ORDER — MORPHINE SULFATE (PF) 0.5 MG/ML IJ SOLN
INTRAMUSCULAR | Status: DC | PRN
  Administered 2017-01-23: .2 mg via INTRATHECAL

## 2017-01-23 MED ORDER — BUPIVACAINE IN DEXTROSE 0.75-8.25 % IT SOLN
INTRATHECAL | Status: DC | PRN
  Administered 2017-01-23: 1.6 mL via INTRATHECAL

## 2017-01-23 MED ORDER — DIPHENHYDRAMINE HCL 25 MG PO CAPS
25.0000 mg | ORAL_CAPSULE | Freq: Four times a day (QID) | ORAL | Status: DC | PRN
  Administered 2017-01-24 (×3): 25 mg via ORAL
  Filled 2017-01-23 (×3): qty 1

## 2017-01-23 MED ORDER — SCOPOLAMINE 1 MG/3DAYS TD PT72
1.0000 | MEDICATED_PATCH | Freq: Once | TRANSDERMAL | Status: AC
  Administered 2017-01-23: 1.5 mg via TRANSDERMAL
  Administered 2017-01-23: 1 via TRANSDERMAL
  Filled 2017-01-23: qty 1

## 2017-01-23 MED ORDER — NALBUPHINE HCL 10 MG/ML IJ SOLN
INTRAMUSCULAR | Status: AC
  Filled 2017-01-23: qty 1

## 2017-01-23 MED ORDER — TETANUS-DIPHTH-ACELL PERTUSSIS 5-2.5-18.5 LF-MCG/0.5 IM SUSP: 0.5000 mL | Freq: Once | INTRAMUSCULAR | Status: DC

## 2017-01-23 MED ORDER — NALOXONE HCL 0.4 MG/ML IJ SOLN: 0.4000 mg | INTRAMUSCULAR | Status: DC | PRN

## 2017-01-23 MED ORDER — LACTATED RINGERS IV SOLN
INTRAVENOUS | Status: DC
  Administered 2017-01-24: 06:00:00 via INTRAVENOUS

## 2017-01-23 SURGICAL SUPPLY — 32 items
BENZOIN TINCTURE PRP APPL 2/3 (GAUZE/BANDAGES/DRESSINGS) ×3 IMPLANT
CHLORAPREP W/TINT 26ML (MISCELLANEOUS) ×3 IMPLANT
CLAMP CORD UMBIL (MISCELLANEOUS) ×9 IMPLANT
CLOSURE WOUND 1/2 X4 (GAUZE/BANDAGES/DRESSINGS) ×1
CLOTH BEACON ORANGE TIMEOUT ST (SAFETY) ×3 IMPLANT
DRESSING DISP NPWT PICO 4X12 (MISCELLANEOUS) ×3 IMPLANT
DRSG OPSITE POSTOP 4X10 (GAUZE/BANDAGES/DRESSINGS) IMPLANT
ELECT REM PT RETURN 9FT ADLT (ELECTROSURGICAL) ×3
ELECTRODE REM PT RTRN 9FT ADLT (ELECTROSURGICAL) ×1 IMPLANT
GLOVE BIO SURGEON ST LM GN SZ9 (GLOVE) ×3 IMPLANT
GLOVE BIOGEL PI IND STRL 7.0 (GLOVE) ×1 IMPLANT
GLOVE BIOGEL PI IND STRL 9 (GLOVE) ×1 IMPLANT
GLOVE BIOGEL PI INDICATOR 7.0 (GLOVE) ×2
GLOVE BIOGEL PI INDICATOR 9 (GLOVE) ×2
GOWN STRL REUS W/TWL 2XL LVL3 (GOWN DISPOSABLE) ×3 IMPLANT
GOWN STRL REUS W/TWL LRG LVL3 (GOWN DISPOSABLE) ×3 IMPLANT
NS IRRIG 1000ML POUR BTL (IV SOLUTION) ×3 IMPLANT
PACK C SECTION WH (CUSTOM PROCEDURE TRAY) ×3 IMPLANT
PAD OB MATERNITY 4.3X12.25 (PERSONAL CARE ITEMS) ×3 IMPLANT
PENCIL SMOKE EVAC W/HOLSTER (ELECTROSURGICAL) ×3 IMPLANT
RTRCTR C-SECT PINK 25CM LRG (MISCELLANEOUS) ×3 IMPLANT
SPONGE LAP 18X18 X RAY DECT (DISPOSABLE) ×6 IMPLANT
STRIP CLOSURE SKIN 1/2X4 (GAUZE/BANDAGES/DRESSINGS) ×2 IMPLANT
SUT MNCRL 0 VIOLET CTX 36 (SUTURE) ×2 IMPLANT
SUT MONOCRYL 0 CTX 36 (SUTURE) ×4
SUT VIC AB 0 CT1 27 (SUTURE) ×2
SUT VIC AB 0 CT1 27XBRD ANBCTR (SUTURE) ×1 IMPLANT
SUT VIC AB 2-0 CT1 27 (SUTURE) ×2
SUT VIC AB 2-0 CT1 TAPERPNT 27 (SUTURE) ×1 IMPLANT
SUT VIC AB 4-0 KS 27 (SUTURE) ×3 IMPLANT
TOWEL OR 17X24 6PK STRL BLUE (TOWEL DISPOSABLE) ×3 IMPLANT
TRAY FOLEY BAG SILVER LF 14FR (SET/KITS/TRAYS/PACK) ×3 IMPLANT

## 2017-01-23 NOTE — Anesthesia Procedure Notes (Signed)
Spinal  Patient location during procedure: OR Start time: 01/23/2017 6:10 PM End time: 01/23/2017 6:13 PM Staffing Anesthesiologist: Leslye PeerBROCK, Antoinette Borgwardt E Performed: anesthesiologist  Preanesthetic Checklist Completed: patient identified, surgical consent, pre-op evaluation, timeout performed, IV checked, risks and benefits discussed and monitors and equipment checked Spinal Block Patient position: sitting Prep: DuraPrep Patient monitoring: heart rate, cardiac monitor, continuous pulse ox and blood pressure Approach: midline Location: L2-3 Injection technique: single-shot Needle Needle type: Pencan  Needle gauge: 24 G Additional Notes Functioning IV was confirmed and monitors were applied. Sterile prep and drape, including hand hygiene, mask, and sterile gloves were used. The patient was positioned and the spine was prepped. The skin was anesthetized with lidocaine. Free flow of clear CSF was obtained prior to injecting local anesthetic into the CSF. The spinal needle aspirated freely following injection. The needle was carefully withdrawn. The patient tolerated the procedure well. Consent was obtained prior to the procedure with all questions answered and concerns addressed. Risks including, but not limited to, bleeding, infection, nerve damage, paralysis, failed block, inadequate analgesia, allergic reaction, high spinal, itching, and headache were discussed and the patient wished to proceed.  Leslye Peerhomas Kenan Moodie, MD

## 2017-01-23 NOTE — Anesthesia Postprocedure Evaluation (Signed)
Anesthesia Post Note  Patient: Barbara Todd  Procedure(s) Performed: Procedure(s) (LRB): CESAREAN SECTION MULTI-GESTATIONAL (Bilateral)     Patient location during evaluation: PACU Anesthesia Type: Spinal Level of consciousness: awake and alert Pain management: pain level controlled Vital Signs Assessment: post-procedure vital signs reviewed and stable Respiratory status: spontaneous breathing and respiratory function stable Cardiovascular status: blood pressure returned to baseline and stable Postop Assessment: spinal receding Anesthetic complications: no    Last Vitals:  Vitals:   01/23/17 2000 01/23/17 2015  BP: 126/71 123/79  Pulse: (!) 102 94  Resp: 15 20  Temp:  36.7 C  SpO2: 98% 100%    Last Pain:  Vitals:   01/23/17 1533  TempSrc: Oral   Pain Goal:                 Beryle Lathehomas E Brock
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
## 2017-01-23 NOTE — Anesthesia Pain Management Evaluation Note (Signed)
  CRNA Pain Management Visit Note  Patient: Barbara Todd, 36 y.o., female  "Hello I am a member of the anesthesia team at [facility]. We have an anesthesia team available at all times to provide care throughout the hospital, including epidural management and anesthesia for C-section. I don't know your plan for the delivery whether it a natural birth, water birth, IV sedation, nitrous supplementation, doula or epidural, but we want to meet your pain goals."   1.Was your pain managed to your expectations on prior hospitalizations?   Yes   2.What is your expectation for pain management during this hospitalization?     Epidural  3.How can we help you reach that goal? epidural  Record the patient's initial score and the patient's pain goal.   Pain: 4/10  Pain Goal: 0/10 [facility] wants you to be able to say your pain was always managed very well.  Salome ArntSterling, Nicolis Boody Marie 01/23/2017

## 2017-01-23 NOTE — Op Note (Signed)
Cesarean Section Operative Report  Barbara Todd  PROCEDURE DATE: 01/23/2017  PREOPERATIVE DIAGNOSES: Intrauterine pregnancy at 7448w0d weeks gestation; twin gestation; Baby A IUFD  POSTOPERATIVE DIAGNOSES: The same  PROCEDURE: Repeat Low Transverse Cesarean Section  SURGEON:   Surgeon(s) and Role:    Tilda Burrow* Ferguson, John V, MD - Primary - Attending   Nolene EbbsJulie Mattalynn Crandle, MD - OB Fellow  ASSISTANT:  Nolene EbbsJulie Prisma Decarlo, MD - OB Fellow   INDICATIONS: Barbara Todd is a 36 y.o. [phone] at 8048w0d here for cesarean section secondary to the indications listed under preoperative diagnoses; please see preoperative note for further details.  The risks of cesarean section were discussed with the patient including but were not limited to: bleeding which may require transfusion or reoperation; infection which may require antibiotics; injury to bowel, bladder, ureters or other surrounding organs; injury to the fetus; need for additional procedures including hysterectomy in the event of a life-threatening hemorrhage; placental abnormalities wth subsequent pregnancies, incisional problems, thromboembolic phenomenon and other postoperative/anesthesia complications.   The patient concurred with the proposed plan, giving informed written consent for the procedure.    FINDINGS:  Nonviable female baby A. Viable female baby B, apgars 3 and 9.  Clear amniotic fluid.  Intact placenta, three vessel cord.  Normal uterus, fallopian tubes and ovaries bilaterally.  ANESTHESIA: Spinal INTRAVENOUS FLUIDS: 3200 ml ESTIMATED BLOOD LOSS: 1900 ml URINE OUTPUT:  100 ml SPECIMENS: Placenta sent to pathology COMPLICATIONS: None immediate  PROCEDURE IN DETAIL:  The patient preoperatively received intravenous antibiotics and had sequential compression devices applied to her lower extremities.  She was then taken to the operating room where spinal anesthesia was administered. She was then placed in a dorsal supine position with a leftward tilt,  and prepped and draped in a sterile manner.  A foley catheter was placed into her bladder and attached to constant gravity.     After an adequate timeout was performed, a Pfannenstiel skin incision was made with scalpel over her preexisting scar and carried through to the underlying layer of fascia. The fascia was incised in the midline, and this incision was extended bilaterally using the Mayo scissors.  Kocher clamps were applied to the superior aspect of the fascial incision and the underlying rectus muscles were dissected off bluntly.  A similar process was carried out on the inferior aspect of the fascial incision. The rectus muscles were separated in the midline bluntly and the peritoneum was entered bluntly. Attention was turned to the lower uterine segment where a low transverse hysterotomy was made with a scalpel and extended bilaterally bluntly. Nonviable Baby A was delivered cephalic, cord clamped and cut, and handed over to the awaiting neonatology team. Baby B was delivered in breech presentation, cord clamped and cut, and handed to neonatology team. Uterine massage was then administered, and the placentas delivered intact. The uterus was then cleared of clots and debris.  The hysterotomy was closed in a running locked fashion, and an imbricating layer was also placed.  The pelvis was cleared of all clot and debris. Hemostasis was confirmed on all surfaces.  The peritoneum was closed with a 2-0 Vicryl running stitch. The fascia was then closed using 0 Vicryl in a running fashion.  The subcutaneous layer was irrigated, then reapproximated with 2-0 plain gut interrupted stitches. The skin was closed with a 4-0 Vicryl subcuticular stitch. Steri strips placed and PICO dressing placed.   The patient tolerated the procedure well. Sponge, lap, instrument and needle counts were correct x  3.  She was taken to the recovery room in stable condition.    Disposition: PACU - hemodynamically stable.    Maternal Condition: stable    Signed: Frederik Pear, MD OB Fellow 01/23/2017 7:50 PM

## 2017-01-23 NOTE — Transfer of Care (Signed)
Immediate Anesthesia Transfer of Care Note  Patient: Barbara Todd  Procedure(s) Performed: Procedure(s): CESAREAN SECTION MULTI-GESTATIONAL (Bilateral)  Patient Location: PACU  Anesthesia Type:Spinal  Level of Consciousness: awake, alert  and oriented  Airway & Oxygen Therapy: Patient Spontanous Breathing  Post-op Assessment: Report given to RN and Post -op Vital signs reviewed and stable  Post vital signs: Reviewed and stable  Last Vitals:  Vitals:   01/23/17 1533 01/23/17 1650  BP: 139/81 126/77  Pulse: (!) 121 (!) 107  Resp: 20   Temp: 36.9 C     Last Pain:  Vitals:   01/23/17 1533  TempSrc: Oral         Complications: No apparent anesthesia complications

## 2017-01-23 NOTE — Anesthesia Preprocedure Evaluation (Addendum)
Anesthesia Evaluation  Patient identified by MRN, date of birth, ID band Patient awake    Reviewed: Allergy & Precautions, NPO status , Patient's Chart, lab work & pertinent test results  Airway Mallampati: II  TM Distance: >3 FB Neck ROM: Full    Dental   Pulmonary neg pulmonary ROS,    breath sounds clear to auscultation       Cardiovascular negative cardio ROS   Rhythm:Regular Rate:Normal     Neuro/Psych negative neurological ROS     GI/Hepatic negative GI ROS, Neg liver ROS,   Endo/Other  diabetes, Gestational  Renal/GU negative Renal ROS     Musculoskeletal   Abdominal   Peds  Hematology negative hematology ROS (+)   Anesthesia Other Findings   Reproductive/Obstetrics (+) Pregnancy (Twin pregnancy. Baby A IUFD.)                            Lab Results  Component Value Date   WBC 11.0 (H) 01/22/2017   HGB 13.2 01/22/2017   HCT 38.2 01/22/2017   MCV 89.3 01/22/2017   PLT 349 01/22/2017   Lab Results  Component Value Date   CREATININE 0.64 01/22/2017   BUN <5 (L) 01/22/2017   NA 136 01/22/2017   K 3.8 01/22/2017   CL 103 01/22/2017   CO2 23 01/22/2017   No results found for: INR, PROTIME  Anesthesia Physical Anesthesia Plan  ASA: III  Anesthesia Plan: Spinal   Post-op Pain Management:    Induction:   PONV Risk Score and Plan: 3 and Ondansetron, Scopolamine patch - Pre-op and Metaclopromide  Airway Management Planned: Natural Airway  Additional Equipment:   Intra-op Plan:   Post-operative Plan:   Informed Consent: I have reviewed the patients History and Physical, chart, labs and discussed the procedure including the risks, benefits and alternatives for the proposed anesthesia with the patient or authorized representative who has indicated his/her understanding and acceptance.     Plan Discussed with: CRNA  Anesthesia Plan Comments:          Anesthesia Quick Evaluation

## 2017-01-24 ENCOUNTER — Encounter (HOSPITAL_COMMUNITY): Payer: Self-pay | Admitting: Obstetrics and Gynecology

## 2017-01-24 ENCOUNTER — Encounter: Payer: Medicaid Other | Admitting: Obstetrics and Gynecology

## 2017-01-24 LAB — CBC
HEMATOCRIT: 27.7 % — AB (ref 36.0–46.0)
Hemoglobin: 9.3 g/dL — ABNORMAL LOW (ref 12.0–15.0)
MCH: 30.1 pg (ref 26.0–34.0)
MCHC: 33.6 g/dL (ref 30.0–36.0)
MCV: 89.6 fL (ref 78.0–100.0)
Platelets: 292 10*3/uL (ref 150–400)
RBC: 3.09 MIL/uL — ABNORMAL LOW (ref 3.87–5.11)
RDW: 13.8 % (ref 11.5–15.5)
WBC: 9.6 10*3/uL (ref 4.0–10.5)

## 2017-01-24 MED ORDER — LORATADINE 10 MG PO TABS
10.0000 mg | ORAL_TABLET | Freq: Every day | ORAL | Status: DC
  Administered 2017-01-24 – 2017-01-26 (×3): 10 mg via ORAL
  Filled 2017-01-24 (×3): qty 1

## 2017-01-24 MED ORDER — INFLUENZA VAC SPLIT QUAD 0.5 ML IM SUSY
0.5000 mL | PREFILLED_SYRINGE | INTRAMUSCULAR | Status: AC
  Administered 2017-01-25: 0.5 mL via INTRAMUSCULAR
  Filled 2017-01-24: qty 0.5

## 2017-01-24 NOTE — Progress Notes (Addendum)
POSTPARTUM PROGRESS NOTE  Post Partum Day 1  Subjective:  Lonn GeorgiaFrances M Waas is a 36 y.o. [id number] s/p PLTCS at 2642w1d.  No acute events overnight.  Pt denies problems with ambulating, or po intake. Foley still in place.  She denies nausea or vomiting.  Pain is well controlled.  She has had flatus. She has not had bowel movement.  Lochia Small.   Objective: Blood pressure 99/65, pulse 71, temperature 97.7 F (36.5 C), temperature source Oral, resp. rate 18, height 5\' 2"  (1.575 m), weight 178 lb (80.7 kg), last menstrual period 05/09/2016, SpO2 100 %.  Physical Exam:  General: alert, cooperative and no distress Chest: no respiratory distress Heart:regular rate, distal pulses intact Abdomen: soft, nontender,  Uterine Fundus: firm, appropriately tender DVT Evaluation: No calf swelling or tenderness Extremities: no edema Skin: warm, dry; PICO dressing in place with small amount    Recent Labs  01/22/17 1205 01/24/17 0544  HGB 13.2 9.3*  HCT 38.2 27.7*    Assessment/Plan: Lonn GeorgiaFrances M Carpenter is a 36 y.o. [id number] s/p RLTCS at 7242w1d. Twin pregnancy with IUFD of Twin A.   POD#1 SW consult for IUFD Contraception: undecided Feeding: breast Dispo: Plan for discharge POD#2 or 3.   LOS: 1 day   Kandra NicolasJulie P DegeleMD 01/24/2017, 7:58 AM

## 2017-01-24 NOTE — Addendum Note (Signed)
Addendum  created 01/24/17 0742 by Yolonda Kidaarver, Kile Kabler L, CRNA   Sign clinical note

## 2017-01-24 NOTE — Progress Notes (Signed)
   Follow up visit with Mrs Osorno and her family after the birth of her twins.  Mrs Dona and I met on 9/11 when she learned that her son Toni Arthurs did not have a heart beat.  She delivered her son and daughter yesterday by c-section.  She presented with a flat affect, but also had several visitors in the room.  She did have questions about final arrangements for her son and I went over the funeral home list with her in addition to offering information about some local resources for affordable cremation and burial.  She does not have an inclination toward one or another according to family traditions and when I asked her if she had any thoughts on her own preference, she said, "it's a lot."  I let her know that she does not have to make that decision right now and all the hospital needs at discharge is her funeral home choice.  She can follow up with the funeral home later to complete that decision.  She can also follow up with our team if she needs to talk through it more.  Beyond speaking about the funeral she was not forthcoming with her feelings.  She's had multiple visitors throughout the day and when I left the room, there were 8 or so people waiting in the hall to come in.    Please page as further needs arise.  Donald Prose. Elyn Peers, M.Div. [facility] Chaplain Pager [phone] Office [phone]    01/24/17 1637  Clinical Encounter Type  Visited With Patient and family together  Visit Type Follow-up;Spiritual support;Death  Spiritual Encounters  Spiritual Needs Emotional;Grief support

## 2017-01-24 NOTE — Lactation Note (Signed)
This note was copied from a baby's chart. Lactation Consultation Note  Patient Name: Barbara Todd Today's Date: 01/24/2017  Baby at 22 hr of life. Mom came in planning breast and formula but has only been giving formula. She told her RN she bf her older children and has been trying to latch this baby. She is "very itchy" and has not been able to keep baby on the breast. She declined help from the RN. It was suggested that mom may not be in a good place to see lactation today and to check in on her on 01/25/17.    Maternal Data    Feeding Feeding Type: Formula Nipple Type: Slow - flow  LATCH Score                   Interventions    Lactation Tools Discussed/Used     Consult Status Consult Status: PRN    Rulon Eisenmengerlizabeth E Kynlee Koenigsberg 01/24/2017, 5:32 PM

## 2017-01-24 NOTE — Anesthesia Postprocedure Evaluation (Signed)
Anesthesia Post Note  Patient: Barbara Todd  Procedure(s) Performed: Procedure(s) (LRB): CESAREAN SECTION MULTI-GESTATIONAL (Bilateral)     Patient location during evaluation: Mother Baby Anesthesia Type: Spinal Level of consciousness: awake, awake and alert, oriented and patient cooperative Pain management: pain level controlled Vital Signs Assessment: post-procedure vital signs reviewed and stable Respiratory status: spontaneous breathing, nonlabored ventilation and respiratory function stable Cardiovascular status: stable Postop Assessment: no headache, no backache, patient able to bend at knees and no signs of nausea or vomiting Anesthetic complications: no    Last Vitals:  Vitals:   01/24/17 0352 01/24/17 0544  BP: 96/66 99/65  Pulse: 70 71  Resp: 18 18  Temp: 36.8 C 36.5 C  SpO2: 99% 100%    Last Pain:  Vitals:   01/24/17 0544  TempSrc: Oral  PainSc:    Pain Goal:                 Sennie Borden L

## 2017-01-25 NOTE — Lactation Note (Signed)
This note was copied from a baby's chart. Lactation Consultation Note  Patient Name: Kamalei Roeder [id number] Date: 01/25/2017 Reason for consult: Follow-up assessment   with this experienced breast feeding mom and early term baby. This baby is girl B, and baby A was a boy, IUFD.,  the day prior to delivery.  Mom has most;y been bottle feeding, but she said the baby had just breast fed well, and she had no questions about latching the baby.  Mom would like a DEP from [facility] , to protect her milk supply - baby small at under 6 pounds, and early - and to provide EBM as supplement to baby.  I faxed a [facility] referral for mom to [facility]. Mom knows to call for questions/conerns.    Maternal Data Does the patient have breastfeeding experience prior to this delivery?: Yes  Feeding Feeding Type: Bottle Fed - Formula  LATCH Score Latch:  (told mom if she breastfeeds to call for latch score)                 Interventions    Lactation Tools Discussed/Used     Consult Status Consult Status: Follow-up Date: 01/26/17 Follow-up type: In-patient    Alfred Levins 01/25/2017, 3:01 PM

## 2017-01-25 NOTE — Discharge Summary (Signed)
OB Discharge Summary     Patient Name: Barbara Todd DOB: [birth year] MRN: [id number]  Date of admission: 01/23/2017 Delivering MD:    Keajah, Killough FD [[id number]]  Midori, Dado [[id number]]  Jonnie Kind   Date of discharge: 01/26/2017  Admitting diagnosis: RCS Undesired Fertility Twins Intrauterine pregnancy: [redacted]w[redacted]d    Secondary diagnosis:  Principal Problem:   Status post repeat low transverse cesarean section Active Problems:   Previous cesarean section complicating pregnancy, antepartum condition or complication   Dizygotic twins   Unwanted fertility   Diabetes mellitus affecting pregnancy, antepartum   Fetal demise, greater than 22 weeks, antepartum, fetus 1  Additional problems:  Patient Active Problem List   Diagnosis Date Noted  . Fetal demise, greater than 22 weeks, antepartum, fetus 1 01/23/2017  . Status post repeat low transverse cesarean section 01/23/2017  . Diabetes mellitus affecting pregnancy, antepartum 12/19/2016  . Unwanted fertility 11/06/2016  . Dizygotic twins 08/16/2016  . Supervision of high risk pregnancy, antepartum 07/19/2016  . Previous cesarean section complicating pregnancy, antepartum condition or complication 033/35/4562       Discharge diagnosis:  Repeat cesarean delivery of term pregnancy fetus B, IUFD fetus A, Diabetes mellitus affecting pregnancy                                                                                                Post partum procedures: N/A  Augmentation: N/A  Complications: None  Hospital course:  Sceduled C/S   36y.o. yo [id number] admitted to the hospital 01/23/2017 for scheduled cesarean section with the following indication:Multifetal Gestation and IUFD fetus A.  Membrane Rupture Time/Date:    GrAbrina, PetzD [[id number]]6:36 PM   GrMaridee, Slape0[[id number]]6:37 PM ,   GrTalon, WittingD [[id number]]01/23/2017   GrDina, [place]0[[id number]]01/23/2017   Patient delivered a Viable infant B, nonviable infant A.Vertis, BauderD [[id number]]01/23/2017   GrSade, Hollon0[[id number]]01/23/2017  Details of operation can be found in separate operative note.  Pateint had an uncomplicated postpartum course.  She is ambulating, tolerating a regular diet, passing flatus, and urinating well. Patient is discharged home in stable condition on  01/26/17.         Physical exam  Vitals:   01/24/17 1815 01/25/17 0510 01/25/17 1825 01/26/17 0520  BP: (!) 116/97 116/60 116/71 113/69  Pulse: 90 75 97 79  Resp: _0 Temp: 98.5 F (36.9 C) 97.9 F (36.6 C) 98.2 F (36.8 C) 97.8 F (36.6 C)  TempSrc: Oral Oral Oral Oral  SpO2: 99%  100%   Weight:      Height:       General: alert, cooperative and no distress Lochia: appropriate Uterine Fundus: firm Incision: Healing well with no significant drainage, No significant erythema, PICO dsg intact DVT Evaluation: No evidence of DVT seen on physical exam. Labs: Lab Results  Component Value Date   WBC 9.6 01/24/2017   HGB 9.3 (  L) 01/24/2017   HCT 27.7 (L) 01/24/2017   MCV 89.6 01/24/2017   PLT 292 01/24/2017   CMP Latest Ref Rng & Units 01/22/2017  Glucose 65 - 99 mg/dL 69  BUN 6 - 20 mg/dL <5(L)  Creatinine 0.44 - 1.00 mg/dL 0.64  Sodium 135 - 145 mmol/L 136  Potassium 3.5 - 5.1 mmol/L 3.8  Chloride 101 - 111 mmol/L 103  CO2 22 - 32 mmol/L 23  Calcium 8.9 - 10.3 mg/dL 9.1  Total Protein 6.5 - 8.1 g/dL 8.0  Total Bilirubin 0.3 - 1.2 mg/dL 0.5  Alkaline Phos 38 - 126 U/L 295(H)  AST 15 - 41 U/L 23  ALT 14 - 54 U/L 24    Discharge instruction: per After Visit Summary and "Baby and Me Booklet".  After visit meds:  Allergies as of 01/26/2017   No Known Allergies     Medication List    TAKE these medications   ACCU-CHEK FASTCLIX LANCETS Misc 1 kit by Percutaneous route 4 (four) times daily.   ACCU-CHEK GUIDE w/Device Kit 1 kit by  Does not apply route 4 (four) times daily.   acetaminophen 500 MG tablet Commonly known as:  TYLENOL Take 500 mg by mouth every 6 (six) hours as needed for headache.   glucose blood test strip Commonly known as:  ACCU-CHEK GUIDE Use as instructed   ibuprofen 600 MG tablet Commonly known as:  ADVIL,MOTRIN Take 1 tablet (600 mg total) by mouth every 6 (six) hours as needed for moderate pain or cramping.   Misc. Devices Misc Maternal support belt   Oxycodone HCl 10 MG Tabs Take 1 tablet (10 mg total) by mouth every 4 (four) hours as needed for severe pain (pain scale > 7).   PRENATE PIXIE 10-0.6-0.4-200 MG Caps Take 1 tablet by mouth daily.   senna-docusate 8.6-50 MG tablet Commonly known as:  Senokot-S Take 2 tablets by mouth at bedtime as needed for mild constipation.            Discharge Care Instructions        Start     Ordered   01/26/17 0000  ibuprofen (ADVIL,MOTRIN) 600 MG tablet  Every 6 hours PRN     01/26/17 0647   01/26/17 0000  oxyCODONE 10 MG TABS  Every 4 hours PRN     01/26/17 0647   01/26/17 0000  senna-docusate (SENOKOT-S) 8.6-50 MG tablet  At bedtime PRN     01/26/17 0647      Diet: carb modified diet  Activity: Advance as tolerated. Pelvic rest for 6 weeks.   Outpatient follow up:6 weeks Follow up Appt: Future Appointments Date Time Provider [place]  02/21/2017 8:45 AM Chancy Milroy, MD [facility] None   Follow up Visit: 1 week in office to remove PICO  Postpartum contraception: Patch  Newborn Data:   Obdulia, Steier [[id number]]  Live born female  Birth Weight: 4 lb 4.1 oz (1930 g) APGAR: ,    Neftali, Thurow [[id number]]  Live born female  Birth Weight: 5 lb 10.1 oz (2555 g) APGAR: 3, 9  Baby Feeding: Breast Disposition:home with mother   01/26/2017 Metta Clines, DO PGY-2 Family Medicine Resident   Midwife attestation I have seen and examined this patient and agree with above documentation in the  resident's note.   AMISHA POSPISIL is a 36 y.o. [id number] s/p RCS.   Pain is well controlled.  Plan for birth control is Ortho-Evra.  Method of Feeding: breast  PE:  Gen: well appearing Heart: reg rate Lungs: normal WOB Fundus firm Ext: soft, no pain, no edema   Recent Labs  01/24/17 0544  HGB 9.3*  HCT 27.7*     Assessment - discharge today  Plan: - postpartum care discussed - f/u in clinic in 1 week (from surgery) to remove PICO - f/u clinic in 6 weeks for postpartum visit   Julianne Handler, CNM 12:35 PM

## 2017-01-25 NOTE — Discharge Instructions (Signed)

## 2017-01-25 NOTE — Progress Notes (Signed)
Post Op Day 2 Subjective:  Barbara Todd POD#2 from repeat C/S for di-di twins and IUFD of fetus B.  She is doing well.  Ambulating without issue.  Passing flatus.  Tolerating regular diet.   Pain is controlled.  Breast feeding without issue.  Lochia is minimal.   Objective: Blood pressure 116/60, pulse 75, temperature 97.9 F (36.6 C), temperature source Oral, resp. rate 16, height  (1.575 m), weight 80.7 kg (178 lb), last menstrual period 05/09/2016, SpO2 99 %.  Physical Exam:  General: alert and cooperative Lochia: appropriate Uterine Fundus: firm Incision: no significant drainage, no significant erythema DVT Evaluation: No evidence of DVT seen on physical exam.   Recent Labs  01/22/17 1205 01/24/17 0544  HGB 13.2 9.3*  HCT 38.2 27.7*    Assessment/Plan: Continue routine postpartum care.   Plan for discharge tomorrow.  Can discharge later today if desired.   LOS: 2 days   Larene Beach DO, PGY-2 01/25/2017, 9:56 AM   CNM attestation Post Partum Day #2 I have seen and examined this patient and agree with above documentation in the resident's note.   Barbara Todd is a 36 y.o. [id number] s/p rLTCS.  Pt denies problems with ambulating, voiding or po intake. Pain is well controlled.  Plan for birth control is Ortho-Evra.  Method of Feeding: breast  PE:  BP 116/60 (BP Location: Left Arm)   Pulse 75   Temp 97.9 F (36.6 C) (Oral)   Resp 16   Ht  (1.575 m)   Wt 80.7 kg (178 lb)   LMP 05/09/2016 (Exact Date)   SpO2 99%   BMI 32.56 kg/m  Fundus firm  Plan for discharge: 01/26/17 Rev'd with pt that contraceptive patch may decrease breast milk supply, but she states she has used it successfully with breastfeeding in the past  Cam Hai, CNM 3:15 PM 01/25/2017

## 2017-01-25 NOTE — Progress Notes (Signed)
I offered follow up grief support to pt after the loss of her baby boy (twin).  Pt's sister was present and was holding her baby girl.  Pt is focusing her attention on her daughter and does not wish to see her son again while she is here.  I inquired about pictures and she declined.  They are planning to have their baby cremated and will use [employer].  They understand that they will need to go and sign papers with them.  Pt's affect was flat and she did not wish to talk further at this time with me.  I consulted with Lulu Riding, LCSW who plans to see her as well.  Chaplain Dyanne Carrel, Bcc Pager, [phone] 2:16 PM    01/25/17 1400  Clinical Encounter Type  Visited With Patient;Patient and family together  Visit Type Spiritual support

## 2017-01-25 NOTE — Progress Notes (Signed)
CSW received consult due to a score of 9 on the Edinburgh Postnatal Depression Screen.  CSW completed chart review and notes that MOB experienced IUFD of twin A the day prior to delivery and spoke with Chaplain before meeting with MOB.   MOB was receptive to CSW's visit and introduced her company as her husband (initially asleep in recliner) and her sister (holding baby on the couch).  CSW offered condolences for their loss of their son and congratulated them on their baby girl.  MOB appeared quiet, but open to talking about her recent experience and current situation.  CSW informed her that one way we monitor emotional health after delivery is through administering the Edinburgh Postnatal Depression Screen, but added that her postpartum time is clouded with grief due to her loss.  CSW explained that this tool can still be utilized as a way to monitor her mental health and emotions, but encouraged her to allow herself to grieve and be emotional.  CSW informed MOB of the availability of grief counseling through [facility] if she feels she and or her husband would like counseling at any time.  CSW informed her that she can seek counseling from a mental health provider as well and asked if she would be open to taking resources home for this.  She agreed and declined a referral at this time.  CSW inquired about her other children.  MOB reports that she has daughters ages 13, 9, and 6.  CSW asked if they know about the situation yet.  MOB states they do not.  CSW offered to help family think through how they will have this conversation with their daughters, but MOB states she is not ready.  CSW respects this.  CSW strongly encourages involvement with [facility] counseling services either to help their children cope with this loss, or as a support to them as they help their children cope with this loss.  MOB's sister said, "can I ask a question?"  Both CSW and MOB agreed.  She said, "since  they never met their brother, can they just go home and not talk about it?"  CSW explained that there is no right or wrong way to handle this situation, but encouraged family to be open and honest with the children, especially given their ages, the fact that they knew their mother was pregnant with twins, and for the sake of the couples' ability to then grieve openly, as a family.  CSW explained that children are resilient and often cope with things easier than adults do.  CSW told family that it is okay to show emotion in front of the children and also encouraged them to allow themselves space to grieve in private also.  MOB stated understanding.  CSW provided MOB with counseling resources and she was appreciative.   FOB then said, "I want to know why this happened."  CSW normalized and validated his question.  CSW explained that they may be able to arrange a time to meet with their medical provider to ask questions and discuss their situation, but that they may never get the answers they are looking for.  CSW sent message to OB Chief to inquire as to if a meeting with couple can be arranged at some point.  CSW is not sure which OB provider is most appropriate for couple to talk with.   MOB states she feels she will have more closure once her son's body is laid to rest.  She   reports that she has had no difficulty bonding with her daughter and that her baby girl has filled the void that her son has left.  Couple seemed appreciative of CSW's visit and concern for their emotional wellbeing.  CSW provided them with contact information and asked them to call if there is anything CSW can do to support them through this time. 

## 2017-01-26 MED ORDER — OXYCODONE HCL 10 MG PO TABS: 10.0000 mg | ORAL_TABLET | ORAL | 0 refills | Status: DC | PRN

## 2017-01-26 MED ORDER — IBUPROFEN 600 MG PO TABS: 600.0000 mg | ORAL_TABLET | Freq: Four times a day (QID) | ORAL | 0 refills | Status: DC | PRN

## 2017-01-26 MED ORDER — SENNOSIDES-DOCUSATE SODIUM 8.6-50 MG PO TABS
2.0000 | ORAL_TABLET | Freq: Every evening | ORAL | 0 refills | Status: DC | PRN
Start: 2017-01-26 — End: 2017-02-21

## 2017-01-26 NOTE — Lactation Note (Signed)
This note was copied from a baby's chart. Lactation Consultation Note  Patient Name: Barbara Todd [id number] Date: 01/26/2017 Reason for consult: Follow-up assessment;Infant weight loss (3% weight loss )  Baby last was fed 0805 / bottle . Per mom desires to do both. Baby  was to the breast 11-7. Per mom active with [facility] [place] .  LC reviewed supply and demand and potential feeding patterns with a Less than 6 pounds,  And early term infant.  Sore nipple and engorgement prevention and tx reviewed .  And also the importance of consistent pumping whether baby goes to the breast or not ,  Until the weight is back to birth weight and until the baby gets consistent with latching.  LC reviewed potential feeding behaviors and the need for the baby to feeding at least every 3 hours hours  And with feeding cues.  WIC loaner DEBP paperwork completed and LC instructed mom and dad how to use the DEBP .  And when to return. $30.oo obtained.  Mother informed of post-discharge support and given phone number to the lactation department, including services for phone call assistance; out-patient appointments; and breastfeeding support group. List of other breastfeeding resources in the community given in the handout. Encouraged mother to call for problems or concerns related to breastfeeding.   Maternal Data Has patient been taught Hand Expression?: Yes (per mom familiar with hand expressing )  Feeding Feeding Type:  (last ate at 0800 ) Nipple Type: Slow - flow  LATCH Score                   Interventions Interventions: Breast feeding basics reviewed  Lactation Tools Discussed/Used Tools: Pump Breast pump type: Double-Electric Breast Pump WIC Program: Yes (per mom [place] ) Pump Review: Setup, frequency, and cleaning;Milk Storage Initiated by:: MAI  Date initiated:: 01/26/17   Consult Status Consult Status: Complete Date: 01/26/17    Barbara Todd 01/26/2017, 11:08 AM

## 2017-01-28 ENCOUNTER — Ambulatory Visit (HOSPITAL_COMMUNITY)
Admission: RE | Admit: 2017-01-28 | Discharge: 2017-01-28 | Disposition: A | Discharge status: Home / Self Care | Payer: Medicaid Other | Source: Ambulatory Visit

## 2017-01-28 ENCOUNTER — Encounter (HOSPITAL_COMMUNITY): Payer: Self-pay

## 2017-01-28 ENCOUNTER — Ambulatory Visit (HOSPITAL_COMMUNITY): Payer: MEDICAID

## 2017-01-29 ENCOUNTER — Encounter (HOSPITAL_COMMUNITY): Payer: Self-pay | Admitting: *Deleted

## 2017-01-29 ENCOUNTER — Encounter (HOSPITAL_COMMUNITY)
Admission: RE | Admit: 2017-01-29 | Discharge: 2017-01-29 | Disposition: A | Discharge status: Home / Self Care | Payer: Medicaid Other | Source: Ambulatory Visit | Attending: Obstetrics and Gynecology | Admitting: Obstetrics and Gynecology

## 2017-01-30 ENCOUNTER — Encounter: Payer: Self-pay | Admitting: Obstetrics & Gynecology

## 2017-01-30 ENCOUNTER — Ambulatory Visit (INDEPENDENT_AMBULATORY_CARE_PROVIDER_SITE_OTHER): Payer: Medicaid Other | Admitting: Obstetrics & Gynecology

## 2017-01-30 VITALS — BP 123/86 | HR 87 | Ht 62.0 in | Wt 159.0 lb

## 2017-01-30 DIAGNOSIS — Z1389 Encounter for screening for other disorder: Secondary | ICD-10-CM | POA: Diagnosis not present

## 2017-01-30 DIAGNOSIS — Z98891 History of uterine scar from previous surgery: Secondary | ICD-10-CM

## 2017-01-30 NOTE — Patient Instructions (Signed)

## 2017-01-30 NOTE — Progress Notes (Signed)
.  Post Partum Exam  Barbara Todd is a 36 y.o. [id number] female who presents for a postpartum visit. She is 1 week postpartum following a low cervical transverse Cesarean section. I have fully reviewed the prenatal and intrapartum course. The delivery was at 37 gestational weeks.  Anesthesia: spinal. Postpartum course has been complicated by PICO drain in use. Baby's course has been good. Baby is feeding by both breast and bottle - Similac Advance. Bleeding staining only. Bowel function is normal. Bladder function is normal. Patient is not sexually active. Contraception method is none. Postpartum depression screening:neg ONe twin died in utero The following portions of the patient's history were reviewed and updated as appropriate: allergies, current medications, past family history, past medical history, past social history, past surgical history and problem list.  Review of Systems Pertinent items are noted in HPI.    Objective:  Blood pressure 123/86, pulse 87, height  (1.575 m), weight 159 lb (72.1 kg), last menstrual period 05/09/2016, currently breastfeeding.  General:  alert, cooperative and no distress           Abdomen: soft, non-tender; bowel sounds normal; no masses,  no organomegalyDressing with PICO removed and incision intact   Vulva:  not evaluated   Assessment:    1 week postpartum exam. Wound intact  Plan:   1. Contraception: none 2. RTC 4 weeks  Adam Phenix, MD

## 2017-02-21 ENCOUNTER — Encounter: Payer: Self-pay | Admitting: Obstetrics and Gynecology

## 2017-02-21 ENCOUNTER — Ambulatory Visit (INDEPENDENT_AMBULATORY_CARE_PROVIDER_SITE_OTHER): Payer: Medicaid Other | Admitting: Obstetrics and Gynecology

## 2017-02-21 VITALS — BP 114/76 | HR 83 | Ht 62.0 in | Wt 158.0 lb

## 2017-02-21 DIAGNOSIS — Z8632 Personal history of gestational diabetes: Secondary | ICD-10-CM

## 2017-02-21 DIAGNOSIS — Z308 Encounter for other contraceptive management: Secondary | ICD-10-CM

## 2017-02-21 DIAGNOSIS — Z98891 History of uterine scar from previous surgery: Secondary | ICD-10-CM

## 2017-02-21 MED ORDER — NORETHINDRONE 0.35 MG PO TABS: 1.0000 | ORAL_TABLET | Freq: Every day | ORAL | 11 refills | Status: DC

## 2017-02-21 NOTE — Patient Instructions (Signed)

## 2017-02-21 NOTE — Progress Notes (Signed)
Patient states that she wants patch for [facility] but will consider pills until she is done breastfeeding.  Marland Kitchen.Post Partum Exam  Barbara Todd is a 36 y.o. [id number] female who presents for a postpartum visit. She is 4 weeks postpartum following a low cervical transverse Cesarean section. I have fully reviewed the prenatal and intrapartum course. The delivery was at 37.0 gestational weeks.  Anesthesia: spinal. Postpartum course has been . Baby's course has been good. Baby is feeding by breast. Bleeding staining only. Bowel function is normal. Bladder function is normal. Patient is not sexually active. Contraception method is none. Postpartum depression screening:neg  The following portions of the patient's history were reviewed and updated as appropriate: allergies, current medications, past family history, past medical history, past social history and past surgical history.  Review of Systems Pertinent items are noted in HPI.    Objective:  currently breastfeeding.  General:  alert   Breasts:  not examined  Lungs: clear to auscultation bilaterally  Heart:  regular rate and rhythm, S1, S2 normal, no murmur, click, rub or gallop  Abdomen: soft, non-tender; bowel sounds normal; no masses,  no organomegaly,  Incision well healed   Vulva:  not evaluated  Vagina: not evaluated  Cervix:  not evaluated  Corpus: not examined  Adnexa:  not evaluated  Rectal Exam: Not performed.        Assessment:    Nl postpartum exam.   Contraceptive management  H/O GDM  Plan:   1. Contraception: oral progesterone-only contraceptive 2. Return to nl ADL's 3. Glucola next week to f/u GDM. 4. F/U in 1 year of PRN

## 2017-03-01 ENCOUNTER — Other Ambulatory Visit: Payer: Medicaid Other

## 2017-03-01 DIAGNOSIS — O99814 Abnormal glucose complicating childbirth: Secondary | ICD-10-CM

## 2017-03-05 LAB — GLUCOSE TOLERANCE, 2 HOURS
GLUCOSE FASTING GTT: 79 mg/dL (ref 65–99)
Glucose, 2 hour: 73 mg/dL (ref 65–139)

## 2017-10-18 ENCOUNTER — Inpatient Hospital Stay (HOSPITAL_COMMUNITY)
Admission: AD | Admit: 2017-10-18 | Discharge: 2017-10-18 | Disposition: A | Discharge status: Home / Self Care | Payer: Medicaid Other | Source: Ambulatory Visit | Attending: Obstetrics & Gynecology | Admitting: Obstetrics & Gynecology

## 2017-10-18 ENCOUNTER — Encounter: Payer: Self-pay | Admitting: Student

## 2017-10-18 DIAGNOSIS — R109 Unspecified abdominal pain: Secondary | ICD-10-CM

## 2017-10-18 DIAGNOSIS — Z3A13 13 weeks gestation of pregnancy: Secondary | ICD-10-CM | POA: Diagnosis not present

## 2017-10-18 DIAGNOSIS — O26899 Other specified pregnancy related conditions, unspecified trimester: Secondary | ICD-10-CM

## 2017-10-18 DIAGNOSIS — O26891 Other specified pregnancy related conditions, first trimester: Secondary | ICD-10-CM | POA: Diagnosis not present

## 2017-10-18 LAB — URINALYSIS, ROUTINE W REFLEX MICROSCOPIC
Bilirubin Urine: NEGATIVE
GLUCOSE, UA: NEGATIVE mg/dL
Hgb urine dipstick: NEGATIVE
Ketones, ur: NEGATIVE mg/dL
LEUKOCYTES UA: NEGATIVE
NITRITE: NEGATIVE
PH: 7 (ref 5.0–8.0)
PROTEIN: NEGATIVE mg/dL
Specific Gravity, Urine: 1.026 (ref 1.005–1.030)

## 2017-10-18 NOTE — Discharge Instructions (Signed)
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
## 2017-10-18 NOTE — MAU Note (Signed)
Feeling pain all over  stomach for last 2 nights. Unsure how pregnant I am. Denies bleeding, LOF, vag d/c. [facility]

## 2017-10-21 ENCOUNTER — Ambulatory Visit (INDEPENDENT_AMBULATORY_CARE_PROVIDER_SITE_OTHER): Payer: Medicaid Other | Admitting: Obstetrics & Gynecology

## 2017-10-21 ENCOUNTER — Encounter: Payer: Self-pay | Admitting: Obstetrics & Gynecology

## 2017-10-21 ENCOUNTER — Other Ambulatory Visit (HOSPITAL_COMMUNITY)
Admission: RE | Admit: 2017-10-21 | Discharge: 2017-10-21 | Disposition: A | Discharge status: Home / Self Care | Payer: Medicaid Other | Source: Ambulatory Visit | Attending: Obstetrics & Gynecology | Admitting: Obstetrics & Gynecology

## 2017-10-21 DIAGNOSIS — O0991 Supervision of high risk pregnancy, unspecified, first trimester: Secondary | ICD-10-CM

## 2017-10-21 DIAGNOSIS — Z3A12 12 weeks gestation of pregnancy: Secondary | ICD-10-CM | POA: Insufficient documentation

## 2017-10-21 DIAGNOSIS — Z3481 Encounter for supervision of other normal pregnancy, first trimester: Secondary | ICD-10-CM | POA: Diagnosis present

## 2017-10-21 DIAGNOSIS — O09299 Supervision of pregnancy with other poor reproductive or obstetric history, unspecified trimester: Secondary | ICD-10-CM | POA: Insufficient documentation

## 2017-10-21 DIAGNOSIS — O09292 Supervision of pregnancy with other poor reproductive or obstetric history, second trimester: Secondary | ICD-10-CM

## 2017-10-21 DIAGNOSIS — O09521 Supervision of elderly multigravida, first trimester: Secondary | ICD-10-CM | POA: Insufficient documentation

## 2017-10-21 DIAGNOSIS — O09291 Supervision of pregnancy with other poor reproductive or obstetric history, first trimester: Secondary | ICD-10-CM

## 2017-10-21 DIAGNOSIS — Z3490 Encounter for supervision of normal pregnancy, unspecified, unspecified trimester: Secondary | ICD-10-CM

## 2017-10-21 DIAGNOSIS — O0990 Supervision of high risk pregnancy, unspecified, unspecified trimester: Secondary | ICD-10-CM

## 2017-10-21 DIAGNOSIS — O34219 Maternal care for unspecified type scar from previous cesarean delivery: Secondary | ICD-10-CM

## 2017-10-21 NOTE — Patient Instructions (Signed)
Información sobre parto y trabajo de parto prematuros °(Preterm Labor and Birth Information) °La duración de un embarazo normal es de 39 a 41 semanas. Se llama trabajo de parto prematuro cuando se inicia antes de las 37 semanas de embarazo. °¿CUÁLES SON LOS FACTORES DE RIESGO DEL TRABAJO DE PARTO PREMATURO? °Existen mayores probabilidades de trabajo de parto prematuro en mujeres con las siguientes características: °· Tienen ciertas infecciones durante el embarazo, como infección de vejiga, infección de transmisión sexual o infección en el útero (corioamnionitis). °· Tienen el cuello del útero más corto que lo normal. °· Tuvieron trabajo de parto prematuro anteriormente. °· Se sometieron a una cirugía en el cuello del útero. °· Son menores de 17 años o mayores de 35 años de edad. °· Son afroamericanas. °· Están embarazadas de mellizos o de varios bebés (gestación múltiple). °· Consumen drogas o fuman mientras están embarazadas. °· No aumentan de peso lo suficiente durante el embarazo. °· Se embarazan poco después de haber estado embarazadas. °¿CUÁLES SON LOS SÍNTOMAS DEL TRABAJO DE PARTO PREMATURO? °Los síntomas del trabajo de parto prematuro incluyen lo siguiente: °· Calambres similares a los que ocurren durante el período menstrual. Los calambres pueden presentarse con diarrea. °· Dolor en el abdomen o en la parte inferior de la espalda. °· Contracciones uterinas regulares que se pueden sentir como una presión en el abdomen. °· Una sensación de mayor presión en la pelvis. °· Aumento de la secreción de moco acuoso o sanguinolento en la vagina. °· Rotura de bolsa (rotura de saco amniótico). °¿POR QUÉ ES IMPORTANTE RECONOCER LOS SIGNOS DEL TRABAJO DE PARTO PREMATURO? °Es importante reconocer los signos del trabajo de parto prematuro porque los bebés que nacen de forma prematura pueden no estar completamente desarrollados. Por lo tanto, pueden correr mayor riesgo de lo siguiente: °· Problemas cardíacos y pulmonares a  largo plazo (crónicos). °· Inmediatamente después del parto, dificultades para regular los sistemas corporales, que incluyen glucemia, temperatura corporal, frecuencia cardíaca y frecuencia respiratoria. °· Hemorragia cerebral. °· Parálisis cerebral. °· Dificultades en el aprendizaje. °· Muerte. °Estos riesgos son mucho mayores para bebés que nacen antes de las 34 semanas de embarazo. °¿CÓMO SE TRATA EL TRABAJO DE PARTO PREMATURO? °El tratamiento depende del tiempo de su embarazo, su afección y la salud de su bebé. Puede incluir lo siguiente: °· Tener un punto (sutura) en el cuello del útero para evitar que este se abra demasiado pronto (cerclaje). °· Tomar medicamentos, por ejemplo: °? Medicamentos hormonales. Estos se pueden administrar de forma temprana en el embarazo para ayudar a mantener el embarazo. °? Medicamentos para detener las contracciones. °? Medicamentos que ayudan a madurar los pulmones del bebé. Estos se pueden recetar si el riesgo de parto es alto. °? Medicamentos para evitar que el bebé desarrolle parálisis cerebral. °Si el trabajo de parto de inicia antes de las 34 semanas de embarazo, es posible que deba hospitalizarse. °¿QUÉ DEBO HACER SI CREO QUE ESTOY EN TRABAJO DE PARTO PREMATURO? °Si cree que está iniciando trabajo de parto prematuro, llame al médico de inmediato. °¿CÓMO PUEDO EVITAR EL TRABAJO DE PARTO PREMATURO EN FUTUROS EMBARAZOS? °Para aumentar las probabilidades de tener un embarazo a término, tenga en cuenta lo siguiente: °· No consuma ningún producto que contenga tabaco, lo que incluye cigarrillos, tabaco de mascar y cigarrillos electrónicos. Si necesita ayuda para dejar de fumar, consulte al médico. °· No consuma drogas ni medicamentos que no sean recetados durante el embarazo. °· Hable con el médico antes de tomar suplementos a base de hierbas aunque los haya estado tomando   periódicamente. °· Asegúrese de llegar a un peso saludable durante el embarazo. °· Tenga cuidado con las  infecciones. Si cree que puede tener una infección, consulte al médico para que la revisen. °· Asegúrese de informarle al médico si ha tenido trabajo de parto prematuro antes. °Esta información no tiene como fin reemplazar el consejo del médico. Asegúrese de hacerle al médico cualquier pregunta que tenga. °Document Released: 08/07/2007 Document Revised: 12/31/2012 Document Reviewed: 09/21/2015 °Elsevier Interactive Patient Education © 2018 [employer] ° °
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
## 2017-10-22 LAB — CERVICOVAGINAL ANCILLARY ONLY
CHLAMYDIA, DNA PROBE: NEGATIVE
NEISSERIA GONORRHEA: NEGATIVE

## 2017-10-23 LAB — OBSTETRIC PANEL, INCLUDING HIV
ANTIBODY SCREEN: NEGATIVE
BASOS: 0 %
Basophils Absolute: 0 10*3/uL (ref 0.0–0.2)
EOS (ABSOLUTE): 0.3 10*3/uL (ref 0.0–0.4)
EOS: 3 %
HEMATOCRIT: 37.6 % (ref 34.0–46.6)
HEMOGLOBIN: 12.8 g/dL (ref 11.1–15.9)
HEP B S AG: NEGATIVE
HIV Screen 4th Generation wRfx: NONREACTIVE
Immature Grans (Abs): 0 10*3/uL (ref 0.0–0.1)
Immature Granulocytes: 1 %
LYMPHS: 25 %
Lymphocytes Absolute: 2.1 10*3/uL (ref 0.7–3.1)
MCH: 30.6 pg (ref 26.6–33.0)
MCHC: 34 g/dL (ref 31.5–35.7)
MCV: 90 fL (ref 79–97)
Monocytes Absolute: 0.3 10*3/uL (ref 0.1–0.9)
Monocytes: 4 %
Neutrophils Absolute: 5.6 10*3/uL (ref 1.4–7.0)
Neutrophils: 67 %
Platelets: 255 10*3/uL (ref 150–450)
RBC: 4.18 x10E6/uL (ref 3.77–5.28)
RDW: 13.5 % (ref 12.3–15.4)
RPR Ser Ql: NONREACTIVE
Rh Factor: POSITIVE
Rubella Antibodies, IGG: 12.8 index (ref >0.99)
WBC: 8.3 10*3/uL (ref 3.4–10.8)

## 2017-10-23 LAB — HEMOGLOBINOPATHY EVALUATION
HEMOGLOBIN F QUANTITATION: 0 % (ref 0.0–2.0)
HGB C: 0 %
HGB S: 0 %
HGB VARIANT: 0 %
Hemoglobin A2 Quantitation: 2.4 % (ref 1.8–3.2)
Hgb A: 97.6 % (ref 96.4–98.8)

## 2017-10-23 LAB — URINE CULTURE, OB REFLEX

## 2017-10-23 LAB — CULTURE, OB URINE

## 2017-10-28 ENCOUNTER — Encounter: Payer: Self-pay | Admitting: Obstetrics & Gynecology

## 2017-10-28 ENCOUNTER — Telehealth: Payer: Self-pay

## 2017-10-28 NOTE — Telephone Encounter (Signed)
Pt called requesting Panorama test results and gender. Low risk girl.

## 2017-10-29 LAB — CYSTIC FIBROSIS MUTATION 97: Interpretation: NOT DETECTED

## 2017-11-18 ENCOUNTER — Encounter: Payer: Self-pay | Admitting: Obstetrics and Gynecology

## 2017-11-18 ENCOUNTER — Ambulatory Visit (INDEPENDENT_AMBULATORY_CARE_PROVIDER_SITE_OTHER): Payer: Medicaid Other | Admitting: Obstetrics and Gynecology

## 2017-11-18 VITALS — BP 110/69 | HR 83 | Wt 164.8 lb

## 2017-11-18 DIAGNOSIS — Z8632 Personal history of gestational diabetes: Secondary | ICD-10-CM

## 2017-11-18 DIAGNOSIS — O0990 Supervision of high risk pregnancy, unspecified, unspecified trimester: Secondary | ICD-10-CM

## 2017-11-18 DIAGNOSIS — O0992 Supervision of high risk pregnancy, unspecified, second trimester: Secondary | ICD-10-CM

## 2017-11-18 DIAGNOSIS — O34219 Maternal care for unspecified type scar from previous cesarean delivery: Secondary | ICD-10-CM

## 2017-11-18 NOTE — Progress Notes (Signed)
   PRENATAL VISIT NOTE  Subjective:  Barbara Todd is a 37 y.o. [id number] at 2074w5d being seen today for ongoing prenatal care.  She is currently monitored for the following issues for this high-risk pregnancy and has Supervision of high risk pregnancy, antepartum; History of gestational diabetes; History of cesarean delivery, antepartum; and Prior pregnancy with fetal demise, antepartum on their problem list.  Patient reports no complaints.  Contractions: Not present. Vag. Bleeding: None.   . Denies leaking of fluid.   The following portions of the patient's history were reviewed and updated as appropriate: allergies, current medications, past family history, past medical history, past social history, past surgical history and problem list. Problem list updated.  Objective:   Vitals:   11/18/17 0812  BP: 110/69  Pulse: 83  Weight: 164 lb 12.8 oz (74.8 kg)    Fetal Status: Fetal Heart Rate (bpm): 148         General:  Alert, oriented and cooperative. Patient is in no acute distress.  Skin: Skin is warm and dry. No rash noted.   Cardiovascular: Normal heart rate noted  Respiratory: Normal respiratory effort, no problems with respiration noted  Abdomen: Soft, gravid, appropriate for gestational age.  Pain/Pressure: Absent     Pelvic: Cervical exam deferred        Extremities: Normal range of motion.  Edema: None  Mental Status: Normal mood and affect. Normal behavior. Normal judgment and thought content.   Assessment and Plan:  Pregnancy: [id number] at 8674w5d  1. Supervision of high risk pregnancy, antepartum Patient is doing well without complaints AFP today Anatomy ultrasound ordered - AFP, Serum, Open Spina Bifida - Hemoglobin A1c  2. History of gestational diabetes A1C today Normal pp 2 hr glucola 6 months ago  3. History of cesarean delivery, antepartum Will be scheduled for repeat  Patient desires BTL- consent to be signed next visit  Preterm labor symptoms and general  obstetric precautions including but not limited to vaginal bleeding, contractions, leaking of fluid and fetal movement were reviewed in detail with the patient. Please refer to After Visit Summary for other counseling recommendations.  Return in about 1 month (around 12/16/2017) for ROB.  No future appointments.  Catalina AntiguaPeggy Aleeah Greeno, MD

## 2017-11-20 LAB — AFP, SERUM, OPEN SPINA BIFIDA
AFP MOM: 0.67
AFP Value: 24.1 ng/mL
GEST. AGE ON COLLECTION DATE: 16.5 wk
MATERNAL AGE AT EDD: 35.7 a
OSBR RISK 1 IN: 10000
Test Results:: NEGATIVE
WEIGHT: 164 [lb_av]

## 2017-11-20 LAB — HEMOGLOBIN A1C
Est. average glucose Bld gHb Est-mCnc: 100 mg/dL
HEMOGLOBIN A1C: 5.1 % (ref 4.8–5.6)

## 2017-11-28 ENCOUNTER — Encounter (HOSPITAL_COMMUNITY): Payer: Self-pay

## 2017-12-05 ENCOUNTER — Encounter (HOSPITAL_COMMUNITY): Payer: Self-pay

## 2017-12-05 ENCOUNTER — Other Ambulatory Visit (HOSPITAL_COMMUNITY): Payer: Self-pay | Admitting: *Deleted

## 2017-12-05 ENCOUNTER — Ambulatory Visit (HOSPITAL_COMMUNITY)
Admission: RE | Admit: 2017-12-05 | Discharge: 2017-12-05 | Disposition: A | Discharge status: Home / Self Care | Payer: Medicaid Other | Source: Ambulatory Visit | Attending: Obstetrics & Gynecology | Admitting: Obstetrics & Gynecology

## 2017-12-05 DIAGNOSIS — O34219 Maternal care for unspecified type scar from previous cesarean delivery: Secondary | ICD-10-CM

## 2017-12-05 DIAGNOSIS — O09522 Supervision of elderly multigravida, second trimester: Secondary | ICD-10-CM | POA: Diagnosis not present

## 2017-12-05 DIAGNOSIS — O09292 Supervision of pregnancy with other poor reproductive or obstetric history, second trimester: Secondary | ICD-10-CM | POA: Diagnosis not present

## 2017-12-05 DIAGNOSIS — O09293 Supervision of pregnancy with other poor reproductive or obstetric history, third trimester: Secondary | ICD-10-CM | POA: Diagnosis not present

## 2017-12-05 DIAGNOSIS — N8312 Corpus luteum cyst of left ovary: Secondary | ICD-10-CM | POA: Insufficient documentation

## 2017-12-05 DIAGNOSIS — Z3A18 18 weeks gestation of pregnancy: Secondary | ICD-10-CM | POA: Diagnosis not present

## 2017-12-05 DIAGNOSIS — Z8632 Personal history of gestational diabetes: Secondary | ICD-10-CM | POA: Insufficient documentation

## 2017-12-05 DIAGNOSIS — Z3490 Encounter for supervision of normal pregnancy, unspecified, unspecified trimester: Secondary | ICD-10-CM

## 2017-12-05 DIAGNOSIS — Z362 Encounter for other antenatal screening follow-up: Secondary | ICD-10-CM

## 2017-12-16 ENCOUNTER — Encounter: Payer: Self-pay | Admitting: Obstetrics and Gynecology

## 2017-12-16 ENCOUNTER — Ambulatory Visit (INDEPENDENT_AMBULATORY_CARE_PROVIDER_SITE_OTHER): Payer: Medicaid Other | Admitting: Obstetrics and Gynecology

## 2017-12-16 VITALS — BP 105/68 | HR 96 | Wt 168.7 lb

## 2017-12-16 DIAGNOSIS — O34219 Maternal care for unspecified type scar from previous cesarean delivery: Secondary | ICD-10-CM

## 2017-12-16 DIAGNOSIS — Z8632 Personal history of gestational diabetes: Secondary | ICD-10-CM

## 2017-12-16 DIAGNOSIS — O0990 Supervision of high risk pregnancy, unspecified, unspecified trimester: Secondary | ICD-10-CM

## 2017-12-16 DIAGNOSIS — O09292 Supervision of pregnancy with other poor reproductive or obstetric history, second trimester: Secondary | ICD-10-CM

## 2017-12-16 NOTE — Progress Notes (Signed)
Patient reports fetal movement, denies pain. 

## 2017-12-16 NOTE — Progress Notes (Signed)
   PRENATAL VISIT NOTE  Subjective:  Barbara Todd is a 37 y.o. [id number] at 4774w5d being seen today for ongoing prenatal care.  She is currently monitored for the following issues for this high-risk pregnancy and has Supervision of high risk pregnancy, antepartum; History of gestational diabetes; History of cesarean delivery, antepartum; and Prior pregnancy with fetal demise, antepartum on their problem list.  Patient reports no complaints.  Contractions: Not present. Vag. Bleeding: None.  Movement: Present. Denies leaking of fluid.   The following portions of the patient's history were reviewed and updated as appropriate: allergies, current medications, past family history, past medical history, past social history, past surgical history and problem list. Problem list updated.  Objective:   Vitals:   12/16/17 0815  BP: 105/68  Pulse: 96  Weight: 168 lb 11.2 oz (76.5 kg)    Fetal Status: Fetal Heart Rate (bpm): 147   Movement: Present     General:  Alert, oriented and cooperative. Patient is in no acute distress.  Skin: Skin is warm and dry. No rash noted.   Cardiovascular: Normal heart rate noted  Respiratory: Normal respiratory effort, no problems with respiration noted  Abdomen: Soft, gravid, appropriate for gestational age.  Pain/Pressure: Absent     Pelvic: Cervical exam deferred        Extremities: Normal range of motion.  Edema: None  Mental Status: Normal mood and affect. Normal behavior. Normal judgment and thought content.   Assessment and Plan:  Pregnancy: [id number] at 6374w5d  1. Supervision of high risk pregnancy, antepartum Patient is doing well Labs reviewed Follow up anatomy ultrasound scheduled  2. History of cesarean delivery, antepartum Will be scheduled for repeat with BTL BTL consent signed today  3. Prior pregnancy with fetal demise and current pregnancy in second trimester Will start antenatal testing at 32 weeks  4. History of gestational  diabetes Normal A1c (5.1)  Preterm labor symptoms and general obstetric precautions including but not limited to vaginal bleeding, contractions, leaking of fluid and fetal movement were reviewed in detail with the patient. Please refer to After Visit Summary for other counseling recommendations.  No follow-ups on file.  Future Appointments  Date Time Provider Department Center  01/06/2018  7:45 AM [facility] US 2 [facility]    Catalina AntiguaPeggy Arkel Cartwright, MD

## 2018-01-06 ENCOUNTER — Ambulatory Visit (HOSPITAL_COMMUNITY)
Admission: RE | Admit: 2018-01-06 | Discharge: 2018-01-06 | Disposition: A | Discharge status: Home / Self Care | Payer: Medicaid Other | Source: Ambulatory Visit | Attending: Obstetrics and Gynecology | Admitting: Obstetrics and Gynecology

## 2018-01-06 ENCOUNTER — Other Ambulatory Visit (HOSPITAL_COMMUNITY): Payer: Self-pay | Admitting: *Deleted

## 2018-01-06 ENCOUNTER — Encounter (HOSPITAL_COMMUNITY): Payer: Self-pay

## 2018-01-06 DIAGNOSIS — Z8759 Personal history of other complications of pregnancy, childbirth and the puerperium: Secondary | ICD-10-CM

## 2018-01-06 DIAGNOSIS — O34219 Maternal care for unspecified type scar from previous cesarean delivery: Secondary | ICD-10-CM | POA: Diagnosis not present

## 2018-01-06 DIAGNOSIS — Z362 Encounter for other antenatal screening follow-up: Secondary | ICD-10-CM | POA: Diagnosis not present

## 2018-01-06 DIAGNOSIS — Z3689 Encounter for other specified antenatal screening: Secondary | ICD-10-CM | POA: Insufficient documentation

## 2018-01-06 DIAGNOSIS — O09522 Supervision of elderly multigravida, second trimester: Secondary | ICD-10-CM | POA: Insufficient documentation

## 2018-01-06 DIAGNOSIS — O09529 Supervision of elderly multigravida, unspecified trimester: Secondary | ICD-10-CM

## 2018-01-06 DIAGNOSIS — O09292 Supervision of pregnancy with other poor reproductive or obstetric history, second trimester: Secondary | ICD-10-CM | POA: Diagnosis not present

## 2018-01-06 DIAGNOSIS — Z3A22 22 weeks gestation of pregnancy: Secondary | ICD-10-CM | POA: Diagnosis not present

## 2018-01-14 ENCOUNTER — Ambulatory Visit (INDEPENDENT_AMBULATORY_CARE_PROVIDER_SITE_OTHER): Payer: Medicaid Other | Admitting: Obstetrics and Gynecology

## 2018-01-14 ENCOUNTER — Encounter: Payer: Self-pay | Admitting: Obstetrics and Gynecology

## 2018-01-14 VITALS — BP 105/70 | HR 85 | Wt 172.2 lb

## 2018-01-14 DIAGNOSIS — Z8632 Personal history of gestational diabetes: Secondary | ICD-10-CM

## 2018-01-14 DIAGNOSIS — Z23 Encounter for immunization: Secondary | ICD-10-CM

## 2018-01-14 DIAGNOSIS — Z3009 Encounter for other general counseling and advice on contraception: Secondary | ICD-10-CM | POA: Insufficient documentation

## 2018-01-14 DIAGNOSIS — O0990 Supervision of high risk pregnancy, unspecified, unspecified trimester: Secondary | ICD-10-CM

## 2018-01-14 DIAGNOSIS — O0992 Supervision of high risk pregnancy, unspecified, second trimester: Secondary | ICD-10-CM

## 2018-01-14 DIAGNOSIS — O34219 Maternal care for unspecified type scar from previous cesarean delivery: Secondary | ICD-10-CM

## 2018-01-14 NOTE — Progress Notes (Signed)
Subjective:  Barbara Todd is a 37 y.o. [id number] at [redacted]w[redacted]d being seen today for ongoing prenatal care.  She is currently monitored for the following issues for this high-risk pregnancy and has Supervision of high risk pregnancy, antepartum; History of gestational diabetes; History of cesarean delivery, antepartum; Prior pregnancy with fetal demise, antepartum; and Unwanted fertility on their problem list.  Patient reports no complaints.  Contractions: Not present. Vag. Bleeding: None.  Movement: Present. Denies leaking of fluid.   The following portions of the patient's history were reviewed and updated as appropriate: allergies, current medications, past family history, past medical history, past social history, past surgical history and problem list. Problem list updated.  Objective:   Vitals:   01/14/18 0853  BP: 105/70  Pulse: 85  Weight: 172 lb 3.2 oz (78.1 kg)    Fetal Status:     Movement: Present     General:  Alert, oriented and cooperative. Patient is in no acute distress.  Skin: Skin is warm and dry. No rash noted.   Cardiovascular: Normal heart rate noted  Respiratory: Normal respiratory effort, no problems with respiration noted  Abdomen: Soft, gravid, appropriate for gestational age. Pain/Pressure: Absent     Pelvic:  Cervical exam deferred        Extremities: Normal range of motion.  Edema: None  Mental Status: Normal mood and affect. Normal behavior. Normal judgment and thought content.   Urinalysis:      Assessment and Plan:  Pregnancy: [id number] at [redacted]w[redacted]d  1. Supervision of high risk pregnancy, antepartum Stable Glucola next visit - Flu Vaccine QUAD 36+ mos IM (Fluarix, Quad PF)  2. History of cesarean delivery, antepartum For repeat with BTL  3. History of gestational diabetes Normal Hgb A1c Glucola next visit  4. Unwanted fertility BTL papers signed  Preterm labor symptoms and general obstetric precautions including but not limited to vaginal bleeding,  contractions, leaking of fluid and fetal movement were reviewed in detail with the patient. Please refer to After Visit Summary for other counseling recommendations.  Return in about 4 weeks (around 02/11/2018) for OB visit.   Hermina Staggers, MD

## 2018-01-14 NOTE — Patient Instructions (Signed)

## 2018-02-03 ENCOUNTER — Ambulatory Visit (HOSPITAL_COMMUNITY)
Admission: RE | Admit: 2018-02-03 | Discharge: 2018-02-03 | Disposition: A | Discharge status: Home / Self Care | Payer: Medicaid Other | Source: Ambulatory Visit | Attending: Obstetrics | Admitting: Obstetrics

## 2018-02-03 ENCOUNTER — Encounter (HOSPITAL_COMMUNITY): Payer: Self-pay

## 2018-02-03 ENCOUNTER — Other Ambulatory Visit (HOSPITAL_COMMUNITY): Payer: Self-pay | Admitting: *Deleted

## 2018-02-03 DIAGNOSIS — O09293 Supervision of pregnancy with other poor reproductive or obstetric history, third trimester: Secondary | ICD-10-CM | POA: Diagnosis not present

## 2018-02-03 DIAGNOSIS — Z362 Encounter for other antenatal screening follow-up: Secondary | ICD-10-CM

## 2018-02-03 DIAGNOSIS — O09529 Supervision of elderly multigravida, unspecified trimester: Secondary | ICD-10-CM | POA: Diagnosis present

## 2018-02-03 DIAGNOSIS — O34219 Maternal care for unspecified type scar from previous cesarean delivery: Secondary | ICD-10-CM

## 2018-02-03 DIAGNOSIS — Z3A36 36 weeks gestation of pregnancy: Secondary | ICD-10-CM

## 2018-02-03 DIAGNOSIS — Z8759 Personal history of other complications of pregnancy, childbirth and the puerperium: Secondary | ICD-10-CM | POA: Insufficient documentation

## 2018-02-03 DIAGNOSIS — O09522 Supervision of elderly multigravida, second trimester: Secondary | ICD-10-CM | POA: Diagnosis not present

## 2018-02-03 DIAGNOSIS — Z3A26 26 weeks gestation of pregnancy: Secondary | ICD-10-CM | POA: Diagnosis not present

## 2018-02-10 ENCOUNTER — Other Ambulatory Visit: Payer: Medicaid Other

## 2018-02-10 ENCOUNTER — Ambulatory Visit (INDEPENDENT_AMBULATORY_CARE_PROVIDER_SITE_OTHER): Payer: Medicaid Other | Admitting: Obstetrics and Gynecology

## 2018-02-10 ENCOUNTER — Encounter: Payer: Self-pay | Admitting: Obstetrics and Gynecology

## 2018-02-10 VITALS — BP 99/66 | HR 85 | Wt 172.3 lb

## 2018-02-10 DIAGNOSIS — O09292 Supervision of pregnancy with other poor reproductive or obstetric history, second trimester: Secondary | ICD-10-CM

## 2018-02-10 DIAGNOSIS — O34219 Maternal care for unspecified type scar from previous cesarean delivery: Secondary | ICD-10-CM

## 2018-02-10 DIAGNOSIS — Z3009 Encounter for other general counseling and advice on contraception: Secondary | ICD-10-CM

## 2018-02-10 DIAGNOSIS — O0990 Supervision of high risk pregnancy, unspecified, unspecified trimester: Secondary | ICD-10-CM

## 2018-02-10 NOTE — Progress Notes (Signed)
Pt is here for ROB. G8P4 [redacted]w[redacted]d. 2 hr GTT today.

## 2018-02-10 NOTE — Progress Notes (Signed)
   PRENATAL VISIT NOTE  Subjective:  Barbara Todd is a 37 y.o. [id number] at [redacted]w[redacted]d being seen today for ongoing prenatal care.  She is currently monitored for the following issues for this high-risk pregnancy and has Supervision of high risk pregnancy, antepartum; History of gestational diabetes; History of cesarean delivery, antepartum; Prior pregnancy with fetal demise, antepartum; and Unwanted fertility on their problem list.  Patient reports no complaints.  Contractions: Not present. Vag. Bleeding: None.  Movement: Present. Denies leaking of fluid.   The following portions of the patient's history were reviewed and updated as appropriate: allergies, current medications, past family history, past medical history, past social history, past surgical history and problem list. Problem list updated.  Objective:   Vitals:   02/10/18 0815  BP: 99/66  Pulse: 85  Weight: 172 lb 4.8 oz (78.2 kg)    Fetal Status: Fetal Heart Rate (bpm): 152(Simultaneous filing. User may not have seen previous data.) Fundal Height: 28 cm Movement: Present     General:  Alert, oriented and cooperative. Patient is in no acute distress.  Skin: Skin is warm and dry. No rash noted.   Cardiovascular: Normal heart rate noted  Respiratory: Normal respiratory effort, no problems with respiration noted  Abdomen: Soft, gravid, appropriate for gestational age.  Pain/Pressure: Absent     Pelvic: Cervical exam deferred        Extremities: Normal range of motion.  Edema: None  Mental Status: Normal mood and affect. Normal behavior. Normal judgment and thought content.   Assessment and Plan:  Pregnancy: [id number] at [redacted]w[redacted]d  1. Supervision of high risk pregnancy, antepartum Patient is doing well without complaints Patient states she received flu and Tdap during her last visit Third trimester labs today - Glucose Tolerance, 2 Hours w/1 Hour - CBC - RPR - HIV Antibody (routine testing w rflx)  2. History of cesarean  delivery, antepartum Will be scheduled for repeat with BTL  3. Prior pregnancy with fetal demise and current pregnancy in second trimester Follow up growth ultrasound in October Weekly BPP starting at 32 weeks per MFM  4. Unwanted fertility BTL paper signed  Preterm labor symptoms and general obstetric precautions including but not limited to vaginal bleeding, contractions, leaking of fluid and fetal movement were reviewed in detail with the patient. Please refer to After Visit Summary for other counseling recommendations.  Return in about 2 weeks (around 02/24/2018) for ROB.  Future Appointments  Date Time Provider Department Center  03/03/2018  8:45 AM [facility] [place] 2 [facility]    Catalina Antigua, MD

## 2018-02-12 LAB — CBC
Hematocrit: 37.4 % (ref 34.0–46.6)
Hemoglobin: 12.4 g/dL (ref 11.1–15.9)
MCH: 31.4 pg (ref 26.6–33.0)
MCHC: 33.2 g/dL (ref 31.5–35.7)
MCV: 95 fL (ref 79–97)
PLATELETS: 232 10*3/uL (ref 150–450)
RBC: 3.95 x10E6/uL (ref 3.77–5.28)
RDW: 13.9 % (ref 12.3–15.4)
WBC: 10.6 10*3/uL (ref 3.4–10.8)

## 2018-02-12 LAB — GLUCOSE TOLERANCE, 2 HOURS W/ 1HR
GLUCOSE, 1 HOUR: 104 mg/dL (ref 65–179)
GLUCOSE, FASTING: 79 mg/dL (ref 65–91)
Glucose, 2 hour: 157 mg/dL — ABNORMAL HIGH (ref 65–152)

## 2018-02-12 LAB — RPR: RPR Ser Ql: NONREACTIVE

## 2018-02-12 LAB — HIV ANTIBODY (ROUTINE TESTING W REFLEX): HIV SCREEN 4TH GENERATION: NONREACTIVE

## 2018-02-13 ENCOUNTER — Encounter: Payer: Self-pay | Admitting: Obstetrics and Gynecology

## 2018-02-13 ENCOUNTER — Other Ambulatory Visit: Payer: Self-pay | Admitting: Obstetrics and Gynecology

## 2018-02-13 DIAGNOSIS — O24419 Gestational diabetes mellitus in pregnancy, unspecified control: Secondary | ICD-10-CM | POA: Insufficient documentation

## 2018-02-13 MED ORDER — ACCU-CHEK NANO SMARTVIEW W/DEVICE KIT: 1.0000 | PACK | 0 refills | Status: DC

## 2018-02-13 MED ORDER — ACCU-CHEK FASTCLIX LANCETS MISC: 1.0000 [IU] | Freq: Four times a day (QID) | 12 refills | Status: DC

## 2018-02-13 MED ORDER — GLUCOSE BLOOD VI STRP: ORAL_STRIP | 12 refills | Status: DC

## 2018-02-17 ENCOUNTER — Encounter (HOSPITAL_COMMUNITY): Payer: Self-pay

## 2018-02-18 ENCOUNTER — Other Ambulatory Visit: Payer: Self-pay | Admitting: Obstetrics and Gynecology

## 2018-02-24 ENCOUNTER — Encounter: Payer: Self-pay | Admitting: Obstetrics

## 2018-02-24 ENCOUNTER — Ambulatory Visit (INDEPENDENT_AMBULATORY_CARE_PROVIDER_SITE_OTHER): Payer: Medicaid Other | Admitting: Obstetrics

## 2018-02-24 VITALS — BP 107/71 | HR 85 | Wt 176.8 lb

## 2018-02-24 DIAGNOSIS — O24419 Gestational diabetes mellitus in pregnancy, unspecified control: Secondary | ICD-10-CM

## 2018-02-24 DIAGNOSIS — O0993 Supervision of high risk pregnancy, unspecified, third trimester: Secondary | ICD-10-CM

## 2018-02-24 DIAGNOSIS — O09292 Supervision of pregnancy with other poor reproductive or obstetric history, second trimester: Secondary | ICD-10-CM

## 2018-02-24 DIAGNOSIS — O09293 Supervision of pregnancy with other poor reproductive or obstetric history, third trimester: Secondary | ICD-10-CM

## 2018-02-24 DIAGNOSIS — O0990 Supervision of high risk pregnancy, unspecified, unspecified trimester: Secondary | ICD-10-CM

## 2018-02-24 DIAGNOSIS — O34219 Maternal care for unspecified type scar from previous cesarean delivery: Secondary | ICD-10-CM

## 2018-02-24 NOTE — Progress Notes (Signed)
Subjective:  Barbara Todd is a 37 y.o. [id number] at [redacted]w[redacted]d being seen today for ongoing prenatal care.  She is currently monitored for the following issues for this high-risk pregnancy and has Supervision of high risk pregnancy, antepartum; History of gestational diabetes; History of cesarean delivery, antepartum; Prior pregnancy with fetal demise, antepartum; Unwanted fertility; and Gestational diabetes on their problem list.  Patient reports no complaints.  Contractions: Not present. Vag. Bleeding: None.  Movement: Present. Denies leaking of fluid.   The following portions of the patient's history were reviewed and updated as appropriate: allergies, current medications, past family history, past medical history, past social history, past surgical history and problem list. Problem list updated.  Objective:   Vitals:   02/24/18 1038  BP: 107/71  Pulse: 85  Weight: 176 lb 12.8 oz (80.2 kg)    Fetal Status: Fetal Heart Rate (bpm): 150   Movement: Present     General:  Alert, oriented and cooperative. Patient is in no acute distress.  Skin: Skin is warm and dry. No rash noted.   Cardiovascular: Normal heart rate noted  Respiratory: Normal respiratory effort, no problems with respiration noted  Abdomen: Soft, gravid, appropriate for gestational age. Pain/Pressure: Absent     Pelvic:  Cervical exam deferred        Extremities: Normal range of motion.  Edema: None  Mental Status: Normal mood and affect. Normal behavior. Normal judgment and thought content.   Urinalysis:      Assessment and Plan:  Pregnancy: [id number] at [redacted]w[redacted]d  1. Supervision of high risk pregnancy, antepartum  2. History of cesarean delivery x 4, antepartum - for repeat C/S and BTL  3. Prior pregnancy with fetal demise and current pregnancy in second trimester  4. Gestational diabetes mellitus (GDM) in third trimester, gestational diabetes method of control unspecified - good glucose control  Preterm labor symptoms  and general obstetric precautions including but not limited to vaginal bleeding, contractions, leaking of fluid and fetal movement were reviewed in detail with the patient. Please refer to After Visit Summary for other counseling recommendations.  Return in about 2 weeks (around 03/10/2018) for ROB.   Brock Bad, MDPt presents for ROB. Pt has no complaints.

## 2018-02-26 ENCOUNTER — Ambulatory Visit: Payer: Medicaid Other

## 2018-03-03 ENCOUNTER — Encounter (HOSPITAL_COMMUNITY): Payer: Self-pay

## 2018-03-03 ENCOUNTER — Other Ambulatory Visit (HOSPITAL_COMMUNITY): Payer: Self-pay | Admitting: *Deleted

## 2018-03-03 ENCOUNTER — Ambulatory Visit (HOSPITAL_COMMUNITY)
Admission: RE | Admit: 2018-03-03 | Discharge: 2018-03-03 | Disposition: A | Discharge status: Home / Self Care | Payer: Medicaid Other | Source: Ambulatory Visit | Attending: Obstetrics | Admitting: Obstetrics

## 2018-03-03 DIAGNOSIS — O09293 Supervision of pregnancy with other poor reproductive or obstetric history, third trimester: Secondary | ICD-10-CM

## 2018-03-03 DIAGNOSIS — Z8759 Personal history of other complications of pregnancy, childbirth and the puerperium: Secondary | ICD-10-CM | POA: Insufficient documentation

## 2018-03-03 DIAGNOSIS — O34219 Maternal care for unspecified type scar from previous cesarean delivery: Secondary | ICD-10-CM

## 2018-03-03 DIAGNOSIS — Z3A30 30 weeks gestation of pregnancy: Secondary | ICD-10-CM | POA: Insufficient documentation

## 2018-03-03 DIAGNOSIS — O09523 Supervision of elderly multigravida, third trimester: Secondary | ICD-10-CM | POA: Diagnosis not present

## 2018-03-03 DIAGNOSIS — O24410 Gestational diabetes mellitus in pregnancy, diet controlled: Secondary | ICD-10-CM

## 2018-03-05 ENCOUNTER — Encounter: Payer: Self-pay | Admitting: Registered"

## 2018-03-05 ENCOUNTER — Encounter: Payer: Medicaid Other | Attending: Family Medicine | Admitting: Registered"

## 2018-03-05 DIAGNOSIS — Z713 Dietary counseling and surveillance: Secondary | ICD-10-CM | POA: Insufficient documentation

## 2018-03-05 DIAGNOSIS — O99810 Abnormal glucose complicating pregnancy: Secondary | ICD-10-CM | POA: Insufficient documentation

## 2018-03-05 DIAGNOSIS — O24419 Gestational diabetes mellitus in pregnancy, unspecified control: Secondary | ICD-10-CM | POA: Diagnosis not present

## 2018-03-05 NOTE — Progress Notes (Signed)
Patient was seen on 03/05/18 for Gestational Diabetes self-management class at the [facility]. The following learning objectives were met by the patient during this course:   States the definition of Gestational Diabetes  States why dietary management is important in controlling blood glucose  Describes the effects each nutrient has on blood glucose levels  Demonstrates ability to create a balanced meal plan  Demonstrates carbohydrate counting   States when to check blood glucose levels  Demonstrates proper blood glucose monitoring techniques  States the effect of stress and exercise on blood glucose levels  States the importance of limiting caffeine and abstaining from alcohol and smoking  Blood glucose monitor given: none  Patient instructed to monitor glucose levels: FBS: 60 - <95; 1 hour: <140; 2 hour: <120  Patient received handouts:  Nutrition Diabetes and Pregnancy, including carb counting list  Patient will be seen for follow-up as needed.

## 2018-03-10 ENCOUNTER — Ambulatory Visit (INDEPENDENT_AMBULATORY_CARE_PROVIDER_SITE_OTHER): Payer: Medicaid Other | Admitting: Advanced Practice Midwife

## 2018-03-10 ENCOUNTER — Ambulatory Visit (HOSPITAL_COMMUNITY)
Admission: RE | Admit: 2018-03-10 | Discharge: 2018-03-10 | Disposition: A | Discharge status: Home / Self Care | Payer: Medicaid Other | Source: Ambulatory Visit | Attending: Family Medicine | Admitting: Family Medicine

## 2018-03-10 ENCOUNTER — Encounter (HOSPITAL_COMMUNITY): Payer: Self-pay

## 2018-03-10 VITALS — BP 114/66 | HR 106 | Wt 180.0 lb

## 2018-03-10 DIAGNOSIS — O24410 Gestational diabetes mellitus in pregnancy, diet controlled: Secondary | ICD-10-CM | POA: Diagnosis not present

## 2018-03-10 DIAGNOSIS — O24419 Gestational diabetes mellitus in pregnancy, unspecified control: Secondary | ICD-10-CM

## 2018-03-10 DIAGNOSIS — Z3A31 31 weeks gestation of pregnancy: Secondary | ICD-10-CM | POA: Insufficient documentation

## 2018-03-10 DIAGNOSIS — O09292 Supervision of pregnancy with other poor reproductive or obstetric history, second trimester: Secondary | ICD-10-CM

## 2018-03-10 DIAGNOSIS — O09299 Supervision of pregnancy with other poor reproductive or obstetric history, unspecified trimester: Secondary | ICD-10-CM | POA: Insufficient documentation

## 2018-03-10 DIAGNOSIS — O09523 Supervision of elderly multigravida, third trimester: Secondary | ICD-10-CM

## 2018-03-10 DIAGNOSIS — O09293 Supervision of pregnancy with other poor reproductive or obstetric history, third trimester: Secondary | ICD-10-CM | POA: Diagnosis not present

## 2018-03-10 DIAGNOSIS — Z8759 Personal history of other complications of pregnancy, childbirth and the puerperium: Secondary | ICD-10-CM | POA: Diagnosis present

## 2018-03-10 DIAGNOSIS — O34219 Maternal care for unspecified type scar from previous cesarean delivery: Secondary | ICD-10-CM

## 2018-03-10 DIAGNOSIS — Z3009 Encounter for other general counseling and advice on contraception: Secondary | ICD-10-CM

## 2018-03-10 DIAGNOSIS — O0990 Supervision of high risk pregnancy, unspecified, unspecified trimester: Secondary | ICD-10-CM

## 2018-03-10 NOTE — Patient Instructions (Signed)
Gestational Diabetes Mellitus, Self Care Caring for yourself after you have been diagnosed with gestational diabetes (gestational diabetes mellitus) means keeping your blood sugar (glucose) under control with a balance of:  Nutrition.  Exercise.  Lifestyle changes.  Medicines or insulin, if necessary.  Support from your team of health care providers and others.  The following information explains what you need to know to manage your gestational diabetes at home. What do I need to do to manage my blood glucose?  Check your blood glucose every day during your pregnancy. Do this as often as told by your health care provider.  Contact your health care provider if your blood glucose is above your target for 2 tests in a row. Your health care provider will set individualized treatment goals for you. Generally, the goal of treatment is to maintain the following blood glucose levels during pregnancy:  After not eating for 8 hours (after fasting): at or below 95 mg/dL (5.3 mmol/L).  After meals (postprandial): ? One hour after a meal: at or below 140 mg/dL (7.8 mmol/L). ? Two hours after a meal: at or below 120 mg/dL (6.7 mmol/L).  A1c (hemoglobin A1c) level: 6-6.5%.  What do I need to know about hyperglycemia and hypoglycemia? What is hyperglycemia? Hyperglycemia, also called high blood glucose, occurs when blood glucose is too high. Make sure you know the early signs of hyperglycemia, such as:  Increased thirst.  Hunger.  Feeling very tired.  Needing to urinate more often than usual.  Blurry vision.  What is hypoglycemia? Hypoglycemia, also called low blood glucose, occurswith a blood glucose level at or below 70 mg/dL (3.9 mmol/L). The risk for hypoglycemia increases during or after exercise, during sleep, during illness, and when skipping meals or not eating for a long time (fasting). It is important to know the symptoms of hypoglycemia and treat it right away. Always have a  15-gram rapid-acting carbohydrate snack with you to treat low blood glucose.Family members and close friends should also know the symptoms and should understand how to treat hypoglycemia, in case you are not able to treat yourself. What are the symptoms of hypoglycemia? Hypoglycemia symptoms can include:  Hunger.  Anxiety.  Sweating and feeling clammy.  Confusion.  Dizziness or feeling light-headed.  Sleepiness.  Nausea.  Increased heart rate.  Headache.  Blurry vision.  Seizure.  Nightmares.  Tingling or numbness around the mouth, lips, or tongue.  A change in speech.  Decreased ability to concentrate.  A change in coordination.  Restless sleep.  Tremors or shakes.  Fainting.  Irritability.  How do I treat hypoglycemia?  If you are alert and able to swallow safely, follow the 15:15 rule:  Take 15 grams of a rapid-acting carbohydrate. Rapid-acting options include: ? 1 tube of glucose gel. ? 3 glucose pills. ? 6-8 pieces of hard candy. ? 4 oz (120 mL) of fruit juice. ? 4 oz (120 mL) of regular (not diet) soda.  Check your blood glucose 15 minutes after you take the carbohydrate.  If the repeat blood glucose level is still at or below 70 mg/dL (3.9 mmol/L), take 15 grams of a carbohydrate again.  If your blood glucose level does not increase above 70 mg/dL (3.9 mmol/L) after 3 tries, seek emergency medical care.  After your blood glucose level returns to normal, eat a meal or a snack within 1 hour.  How do I treat severe hypoglycemia? Severe hypoglycemia is when your blood glucose level is at or below 54 mg/dL (  3 mmol/L). Severe hypoglycemia is an emergency. Do not wait to see if the symptoms will go away. Get medical help right away. Call your local emergency services (911 in the [place] Do not drive yourself to the hospital. If you have severe hypoglycemia and you cannot eat or drink, you may need an injection of glucagon. A family member or close  friend should learn how to check your blood glucose and how to give you a glucagon injection. Ask your health care provider if you need to have an emergency glucagon injection kit available. Severe hypoglycemia may need to be treated in a hospital. The treatment may include getting glucose through an IV tube. You may also need treatment for the cause of your hypoglycemia. What else can I do to manage my gestational diabetes? Take your diabetes medicines as told  If your health care provider prescribed insulin or diabetes medicines, take them every day.  Do not run out of insulin or other diabetes medicines that you take. Plan ahead so you always have these available.  If you use insulin, adjust your dosage based on how physically active you are and what foods you eat. Your health care provider will tell you how to adjust your dosage. Make healthy food choices  The things that you eat and drink affect your blood glucose. Making good choices helps to control your diabetes and prevent other health problems. A healthy meal plan includes eating lean proteins, complex carbohydrates, fresh fruits and vegetables, low-fat dairy products, and healthy fats. Make an appointment to see a diet and nutrition specialist (registered dietitian) to help you create an eating plan that is right for you. Make sure that you:  Follow instructions from your health care provider about eating or drinking restrictions.  Drink enough fluid to keep your urine clear or pale yellow.  Eat healthy snacks between nutritious meals.  Track the carbohydrates that you eat. Do this by reading food labels and learning the standard serving sizes of foods.  Follow your sick day plan whenever you cannot eat or drink as usual. Make this plan in advance with your health care provider.  Stay active   Do at least 30 minutes of physical activity a day, or as much physical activity as your health care provider recommends during your  pregnancy. ? Doing 10 minutes of exercise 30 minutes after each meal may help to control postprandial blood glucose levels.  If you start a new exercise or activity, work with your health care provider to adjust your insulin, medicines, or food intake as needed. Make healthy lifestyle choices  Do not drink alcohol.  Do not use any tobacco products, such as cigarettes, chewing tobacco, and e-cigarettes. If you need help quitting, ask your health care provider.  Learn to manage stress. If you need help with this, ask your health care provider. Care for your body  Keep your immunizations up to date.  Brush your teeth and gums two times a day, and floss at least one time a day.  Visit your dentist at least once every 6 months.  Maintain a healthy weight during your pregnancy. General instructions   Take over-the-counter and prescription medicines only as told by your health care provider.  Talk with your health care provider about your risk for high blood pressure during pregnancy (preeclampsia or eclampsia).  Share your diabetes management plan with people in your workplace, school, and household.  Check your urine for ketones during your pregnancy when you are ill and   as told by your health care provider.  Carry a medical alert card or wear medical alert jewelry.  Ask your health care provider: ? Do I need to meet with a diabetes educator? ? Where can I find a support group for people with diabetes?  Keep all follow-up visits during your pregnancy (prenatal) and after delivery (postnatal) as told by your health care provider. This is important. Get the care that you need after delivery  Have your blood glucose level checked 4-12 weeks after delivery. This is done with an oral glucose tolerance test (OGTT).  Get screened for diabetes at least every 3 years, or as often as told by your health care provider. Where to find more information: To learn more about gestational  diabetes, visit:  American Diabetes Association (ADA): www.diabetes.org/diabetes-basics/gestational  Centers for Disease Control and Prevention (CDC): www.cdc.gov/diabetes/pubs/pdf/gestationalDiabetes.pdf  This information is not intended to replace advice given to you by your health care provider. Make sure you discuss any questions you have with your health care provider. Document Released: 08/22/2015 Document Revised: 10/06/2015 Document Reviewed: 06/03/2015 Elsevier Interactive Patient Education  2018 [employer]  

## 2018-03-10 NOTE — Progress Notes (Signed)
   PRENATAL VISIT NOTE  Subjective:  Barbara Todd is a 37 y.o. [id number] at [redacted]w[redacted]d being seen today for ongoing prenatal care.  She is currently monitored for the following issues for this high-risk pregnancy and has Supervision of high risk pregnancy, antepartum; History of gestational diabetes; History of cesarean delivery, antepartum; Prior pregnancy with fetal demise, antepartum; Unwanted fertility; Gestational diabetes; and Abnormal glucose tolerance test (GTT) during pregnancy, antepartum on their problem list.  Patient reports no complaints.  Contractions: Not present. Vag. Bleeding: None.  Movement: Present. Denies leaking of fluid.   The following portions of the patient's history were reviewed and updated as appropriate: allergies, current medications, past family history, past medical history, past social history, past surgical history and problem list. Problem list updated.  Objective:   Vitals:   03/10/18 0841  BP: 114/66  Pulse: (!) 106  Weight: 81.6 kg    Fetal Status: Fetal Heart Rate (bpm): 140 Fundal Height: 30 cm Movement: Present     General:  Alert, oriented and cooperative. Patient is in no acute distress.  Skin: Skin is warm and dry. No rash noted.   Cardiovascular: Normal heart rate noted  Respiratory: Normal respiratory effort, no problems with respiration noted  Abdomen: Soft, gravid, appropriate for gestational age.  Pain/Pressure: Absent     Pelvic: Cervical exam deferred        Extremities: Normal range of motion.     Mental Status: Normal mood and affect. Normal behavior. Normal judgment and thought content.   Assessment and Plan:  Pregnancy: [id number] at [redacted]w[redacted]d  1. Supervision of high risk pregnancy, antepartum --Anticipatory guidance about next visits/weeks of pregnancy given.  2. History of cesarean delivery, antepartum --X4, Plans repeat with BTL  3. Prior pregnancy with fetal demise and current pregnancy in second trimester --One fetus of twins  pregnancy. Starting weekly BPP next week.  4. Gestational diabetes mellitus (GDM) in third trimester, gestational diabetes method of control unspecified --Did not bring log today but reports fasting BG all below 90 and PP below 120.  Reports she is cooking and eating healthy food and walking.  5. Unwanted fertility --BTL, papers in chart  Preterm labor symptoms and general obstetric precautions including but not limited to vaginal bleeding, contractions, leaking of fluid and fetal movement were reviewed in detail with the patient. Please refer to After Visit Summary for other counseling recommendations.  Return in about 2 weeks (around 03/24/2018).  Future Appointments  Date Time Provider Department Center  03/10/2018  9:30 AM Hurshel Party, CNM [facility] None  03/17/2018  8:00 AM [facility] [place] 3 [facility]  03/24/2018  8:45 AM [facility] [place] 2 [facility]  03/24/2018 11:15 AM Leftwich-Kirby, Wilmer Floor, CNM [facility] None  03/31/2018  9:15 AM [facility] [place] 4 [facility]    Sharen Counter, CNM

## 2018-03-17 ENCOUNTER — Encounter (HOSPITAL_COMMUNITY): Payer: Self-pay

## 2018-03-17 ENCOUNTER — Ambulatory Visit (HOSPITAL_COMMUNITY)
Admission: RE | Admit: 2018-03-17 | Discharge: 2018-03-17 | Disposition: A | Discharge status: Home / Self Care | Payer: Medicaid Other | Source: Ambulatory Visit | Attending: Family Medicine | Admitting: Family Medicine

## 2018-03-17 DIAGNOSIS — O09293 Supervision of pregnancy with other poor reproductive or obstetric history, third trimester: Secondary | ICD-10-CM | POA: Diagnosis not present

## 2018-03-17 DIAGNOSIS — O24419 Gestational diabetes mellitus in pregnancy, unspecified control: Secondary | ICD-10-CM

## 2018-03-17 DIAGNOSIS — Z3A32 32 weeks gestation of pregnancy: Secondary | ICD-10-CM | POA: Insufficient documentation

## 2018-03-17 DIAGNOSIS — O09523 Supervision of elderly multigravida, third trimester: Secondary | ICD-10-CM

## 2018-03-17 DIAGNOSIS — O24410 Gestational diabetes mellitus in pregnancy, diet controlled: Secondary | ICD-10-CM | POA: Diagnosis not present

## 2018-03-17 DIAGNOSIS — Z98891 History of uterine scar from previous surgery: Secondary | ICD-10-CM | POA: Diagnosis not present

## 2018-03-17 DIAGNOSIS — Z8759 Personal history of other complications of pregnancy, childbirth and the puerperium: Secondary | ICD-10-CM | POA: Diagnosis present

## 2018-03-17 DIAGNOSIS — O09299 Supervision of pregnancy with other poor reproductive or obstetric history, unspecified trimester: Secondary | ICD-10-CM | POA: Diagnosis not present

## 2018-03-17 DIAGNOSIS — O09893 Supervision of other high risk pregnancies, third trimester: Secondary | ICD-10-CM

## 2018-03-17 DIAGNOSIS — O34219 Maternal care for unspecified type scar from previous cesarean delivery: Secondary | ICD-10-CM | POA: Diagnosis not present

## 2018-03-24 ENCOUNTER — Encounter: Payer: Self-pay | Admitting: Advanced Practice Midwife

## 2018-03-24 ENCOUNTER — Ambulatory Visit (INDEPENDENT_AMBULATORY_CARE_PROVIDER_SITE_OTHER): Payer: Medicaid Other | Admitting: Advanced Practice Midwife

## 2018-03-24 ENCOUNTER — Encounter (HOSPITAL_COMMUNITY): Payer: Self-pay

## 2018-03-24 ENCOUNTER — Ambulatory Visit (HOSPITAL_COMMUNITY)
Admission: RE | Admit: 2018-03-24 | Discharge: 2018-03-24 | Disposition: A | Discharge status: Home / Self Care | Payer: Medicaid Other | Source: Ambulatory Visit | Attending: Family Medicine | Admitting: Family Medicine

## 2018-03-24 VITALS — BP 106/69 | HR 93 | Wt 181.0 lb

## 2018-03-24 DIAGNOSIS — O24430 Gestational diabetes mellitus in the puerperium, diet controlled: Secondary | ICD-10-CM

## 2018-03-24 DIAGNOSIS — O09523 Supervision of elderly multigravida, third trimester: Secondary | ICD-10-CM | POA: Diagnosis not present

## 2018-03-24 DIAGNOSIS — O24419 Gestational diabetes mellitus in pregnancy, unspecified control: Secondary | ICD-10-CM

## 2018-03-24 DIAGNOSIS — O0990 Supervision of high risk pregnancy, unspecified, unspecified trimester: Secondary | ICD-10-CM

## 2018-03-24 DIAGNOSIS — O34219 Maternal care for unspecified type scar from previous cesarean delivery: Secondary | ICD-10-CM

## 2018-03-24 DIAGNOSIS — O09293 Supervision of pregnancy with other poor reproductive or obstetric history, third trimester: Secondary | ICD-10-CM

## 2018-03-24 DIAGNOSIS — Z3A33 33 weeks gestation of pregnancy: Secondary | ICD-10-CM | POA: Diagnosis not present

## 2018-03-24 DIAGNOSIS — O0993 Supervision of high risk pregnancy, unspecified, third trimester: Secondary | ICD-10-CM

## 2018-03-24 DIAGNOSIS — O24410 Gestational diabetes mellitus in pregnancy, diet controlled: Secondary | ICD-10-CM | POA: Diagnosis not present

## 2018-03-24 DIAGNOSIS — O09893 Supervision of other high risk pregnancies, third trimester: Secondary | ICD-10-CM

## 2018-03-24 DIAGNOSIS — Z8759 Personal history of other complications of pregnancy, childbirth and the puerperium: Secondary | ICD-10-CM

## 2018-03-24 DIAGNOSIS — O4103X0 Oligohydramnios, third trimester, not applicable or unspecified: Secondary | ICD-10-CM

## 2018-03-24 NOTE — Progress Notes (Signed)
   PRENATAL VISIT NOTE  Subjective:  Barbara Todd is a 37 y.o. [id number] at [redacted]w[redacted]d being seen today for ongoing prenatal care.  She is currently monitored for the following issues for this high-risk pregnancy and has Supervision of high risk pregnancy, antepartum; History of gestational diabetes; History of cesarean delivery, antepartum; Prior pregnancy with fetal demise, antepartum; Unwanted fertility; Gestational diabetes; and Abnormal glucose tolerance test (GTT) during pregnancy, antepartum on their problem list.  Patient reports occasional contractions.  Contractions: Irritability. Vag. Bleeding: None.  Movement: Present. Denies leaking of fluid.   The following portions of the patient's history were reviewed and updated as appropriate: allergies, current medications, past family history, past medical history, past social history, past surgical history and problem list. Problem list updated.  Objective:   Vitals:   03/24/18 0954  BP: 106/69  Pulse: 93  Weight: 82.1 kg    Fetal Status: Fetal Heart Rate (bpm): 143   Movement: Present     General:  Alert, oriented and cooperative. Patient is in no acute distress.  Skin: Skin is warm and dry. No rash noted.   Cardiovascular: Normal heart rate noted  Respiratory: Normal respiratory effort, no problems with respiration noted  Abdomen: Soft, gravid, appropriate for gestational age.  Pain/Pressure: Present     Pelvic: Cervical exam deferred        Extremities: Normal range of motion.     Mental Status: Normal mood and affect. Normal behavior. Normal judgment and thought content.   Assessment and Plan:  Pregnancy: [id number] at [redacted]w[redacted]d  1. Supervision of high risk pregnancy, antepartum --Anticipatory guidance about next visits/weeks of pregnancy given.  2. History of cesarean delivery, antepartum --x 4, plans repeat  3. Gestational diabetes mellitus (GDM) in third trimester, gestational diabetes method of control unspecified --Forgot  log today, reviewed glucose. --Fastings 85-95, PP none above 120, pt is taking 4 times daily. --Discussed importance of bringing log or meter and reviewing. Pt reports she is very good about checking sugars and wants to do everything she can because of her hx of loss.   4. Prior pregnancy with fetal demise and current pregnancy in third trimester --IUFD of Baby A in twins pregnancy 01/2017 --Weekly BPP with MFM, done today prior to visit  5. Oligohydramnios in third trimester, single or unspecified fetus --AFI was 5 on 11/4 but with >2 cm pocket, repeat BPP today.   Preterm labor symptoms and general obstetric precautions including but not limited to vaginal bleeding, contractions, leaking of fluid and fetal movement were reviewed in detail with the patient. Please refer to After Visit Summary for other counseling recommendations.  Return in about 2 weeks (around 04/07/2018).  Future Appointments  Date Time Provider Department Center  03/24/2018 11:15 AM Leftwich-Kirby, Wilmer Floor, CNM [facility] None  03/31/2018  9:15 AM [facility] [place] 4 [facility]    Sharen Counter, CNM

## 2018-03-24 NOTE — Progress Notes (Signed)
Pt forgot to bring log Pt states she is doing well with her blood sugars  Pt states she did not check this morning due to running late for appts.

## 2018-03-24 NOTE — Patient Instructions (Signed)
Gestational Diabetes Mellitus, Self Care Caring for yourself after you have been diagnosed with gestational diabetes (gestational diabetes mellitus) means keeping your blood sugar (glucose) under control with a balance of:  Nutrition.  Exercise.  Lifestyle changes.  Medicines or insulin, if necessary.  Support from your team of health care providers and others.  The following information explains what you need to know to manage your gestational diabetes at home. What do I need to do to manage my blood glucose?  Check your blood glucose every day during your pregnancy. Do this as often as told by your health care provider.  Contact your health care provider if your blood glucose is above your target for 2 tests in a row. Your health care provider will set individualized treatment goals for you. Generally, the goal of treatment is to maintain the following blood glucose levels during pregnancy:  After not eating for 8 hours (after fasting): at or below 95 mg/dL (5.3 mmol/L).  After meals (postprandial): ? One hour after a meal: at or below 140 mg/dL (7.8 mmol/L). ? Two hours after a meal: at or below 120 mg/dL (6.7 mmol/L).  A1c (hemoglobin A1c) level: 6-6.5%.  What do I need to know about hyperglycemia and hypoglycemia? What is hyperglycemia? Hyperglycemia, also called high blood glucose, occurs when blood glucose is too high. Make sure you know the early signs of hyperglycemia, such as:  Increased thirst.  Hunger.  Feeling very tired.  Needing to urinate more often than usual.  Blurry vision.  What is hypoglycemia? Hypoglycemia, also called low blood glucose, occurswith a blood glucose level at or below 70 mg/dL (3.9 mmol/L). The risk for hypoglycemia increases during or after exercise, during sleep, during illness, and when skipping meals or not eating for a long time (fasting). It is important to know the symptoms of hypoglycemia and treat it right away. Always have a  15-gram rapid-acting carbohydrate snack with you to treat low blood glucose.Family members and close friends should also know the symptoms and should understand how to treat hypoglycemia, in case you are not able to treat yourself. What are the symptoms of hypoglycemia? Hypoglycemia symptoms can include:  Hunger.  Anxiety.  Sweating and feeling clammy.  Confusion.  Dizziness or feeling light-headed.  Sleepiness.  Nausea.  Increased heart rate.  Headache.  Blurry vision.  Seizure.  Nightmares.  Tingling or numbness around the mouth, lips, or tongue.  A change in speech.  Decreased ability to concentrate.  A change in coordination.  Restless sleep.  Tremors or shakes.  Fainting.  Irritability.  How do I treat hypoglycemia?  If you are alert and able to swallow safely, follow the 15:15 rule:  Take 15 grams of a rapid-acting carbohydrate. Rapid-acting options include: ? 1 tube of glucose gel. ? 3 glucose pills. ? 6-8 pieces of hard candy. ? 4 oz (120 mL) of fruit juice. ? 4 oz (120 mL) of regular (not diet) soda.  Check your blood glucose 15 minutes after you take the carbohydrate.  If the repeat blood glucose level is still at or below 70 mg/dL (3.9 mmol/L), take 15 grams of a carbohydrate again.  If your blood glucose level does not increase above 70 mg/dL (3.9 mmol/L) after 3 tries, seek emergency medical care.  After your blood glucose level returns to normal, eat a meal or a snack within 1 hour.  How do I treat severe hypoglycemia? Severe hypoglycemia is when your blood glucose level is at or below 54 mg/dL (  3 mmol/L). Severe hypoglycemia is an emergency. Do not wait to see if the symptoms will go away. Get medical help right away. Call your local emergency services (911 in the [place] Do not drive yourself to the hospital. If you have severe hypoglycemia and you cannot eat or drink, you may need an injection of glucagon. A family member or close  friend should learn how to check your blood glucose and how to give you a glucagon injection. Ask your health care provider if you need to have an emergency glucagon injection kit available. Severe hypoglycemia may need to be treated in a hospital. The treatment may include getting glucose through an IV tube. You may also need treatment for the cause of your hypoglycemia. What else can I do to manage my gestational diabetes? Take your diabetes medicines as told  If your health care provider prescribed insulin or diabetes medicines, take them every day.  Do not run out of insulin or other diabetes medicines that you take. Plan ahead so you always have these available.  If you use insulin, adjust your dosage based on how physically active you are and what foods you eat. Your health care provider will tell you how to adjust your dosage. Make healthy food choices  The things that you eat and drink affect your blood glucose. Making good choices helps to control your diabetes and prevent other health problems. A healthy meal plan includes eating lean proteins, complex carbohydrates, fresh fruits and vegetables, low-fat dairy products, and healthy fats. Make an appointment to see a diet and nutrition specialist (registered dietitian) to help you create an eating plan that is right for you. Make sure that you:  Follow instructions from your health care provider about eating or drinking restrictions.  Drink enough fluid to keep your urine clear or pale yellow.  Eat healthy snacks between nutritious meals.  Track the carbohydrates that you eat. Do this by reading food labels and learning the standard serving sizes of foods.  Follow your sick day plan whenever you cannot eat or drink as usual. Make this plan in advance with your health care provider.  Stay active   Do at least 30 minutes of physical activity a day, or as much physical activity as your health care provider recommends during your  pregnancy. ? Doing 10 minutes of exercise 30 minutes after each meal may help to control postprandial blood glucose levels.  If you start a new exercise or activity, work with your health care provider to adjust your insulin, medicines, or food intake as needed. Make healthy lifestyle choices  Do not drink alcohol.  Do not use any tobacco products, such as cigarettes, chewing tobacco, and e-cigarettes. If you need help quitting, ask your health care provider.  Learn to manage stress. If you need help with this, ask your health care provider. Care for your body  Keep your immunizations up to date.  Brush your teeth and gums two times a day, and floss at least one time a day.  Visit your dentist at least once every 6 months.  Maintain a healthy weight during your pregnancy. General instructions   Take over-the-counter and prescription medicines only as told by your health care provider.  Talk with your health care provider about your risk for high blood pressure during pregnancy (preeclampsia or eclampsia).  Share your diabetes management plan with people in your workplace, school, and household.  Check your urine for ketones during your pregnancy when you are ill and   as told by your health care provider.  Carry a medical alert card or wear medical alert jewelry.  Ask your health care provider: ? Do I need to meet with a diabetes educator? ? Where can I find a support group for people with diabetes?  Keep all follow-up visits during your pregnancy (prenatal) and after delivery (postnatal) as told by your health care provider. This is important. Get the care that you need after delivery  Have your blood glucose level checked 4-12 weeks after delivery. This is done with an oral glucose tolerance test (OGTT).  Get screened for diabetes at least every 3 years, or as often as told by your health care provider. Where to find more information: To learn more about gestational  diabetes, visit:  American Diabetes Association (ADA): www.diabetes.org/diabetes-basics/gestational  Centers for Disease Control and Prevention (CDC): www.cdc.gov/diabetes/pubs/pdf/gestationalDiabetes.pdf  This information is not intended to replace advice given to you by your health care provider. Make sure you discuss any questions you have with your health care provider. Document Released: 08/22/2015 Document Revised: 10/06/2015 Document Reviewed: 06/03/2015 Elsevier Interactive Patient Education  2018 [employer]  

## 2018-03-31 ENCOUNTER — Encounter (HOSPITAL_COMMUNITY): Payer: Self-pay

## 2018-03-31 ENCOUNTER — Other Ambulatory Visit (HOSPITAL_COMMUNITY): Payer: Self-pay | Admitting: *Deleted

## 2018-03-31 ENCOUNTER — Ambulatory Visit (HOSPITAL_COMMUNITY)
Admission: RE | Admit: 2018-03-31 | Discharge: 2018-03-31 | Disposition: A | Discharge status: Home / Self Care | Payer: Medicaid Other | Source: Ambulatory Visit | Attending: Advanced Practice Midwife | Admitting: Advanced Practice Midwife

## 2018-03-31 DIAGNOSIS — O24410 Gestational diabetes mellitus in pregnancy, diet controlled: Secondary | ICD-10-CM | POA: Insufficient documentation

## 2018-03-31 DIAGNOSIS — O09893 Supervision of other high risk pregnancies, third trimester: Secondary | ICD-10-CM | POA: Insufficient documentation

## 2018-03-31 DIAGNOSIS — Z8759 Personal history of other complications of pregnancy, childbirth and the puerperium: Secondary | ICD-10-CM

## 2018-03-31 DIAGNOSIS — O34219 Maternal care for unspecified type scar from previous cesarean delivery: Secondary | ICD-10-CM | POA: Insufficient documentation

## 2018-03-31 DIAGNOSIS — O09293 Supervision of pregnancy with other poor reproductive or obstetric history, third trimester: Secondary | ICD-10-CM | POA: Insufficient documentation

## 2018-03-31 DIAGNOSIS — Z3A34 34 weeks gestation of pregnancy: Secondary | ICD-10-CM

## 2018-03-31 DIAGNOSIS — O09523 Supervision of elderly multigravida, third trimester: Secondary | ICD-10-CM | POA: Insufficient documentation

## 2018-04-07 ENCOUNTER — Encounter (HOSPITAL_COMMUNITY): Payer: Self-pay

## 2018-04-07 ENCOUNTER — Ambulatory Visit (INDEPENDENT_AMBULATORY_CARE_PROVIDER_SITE_OTHER): Payer: Medicaid Other | Admitting: Advanced Practice Midwife

## 2018-04-07 ENCOUNTER — Ambulatory Visit (HOSPITAL_COMMUNITY)
Admission: RE | Admit: 2018-04-07 | Discharge: 2018-04-07 | Disposition: A | Discharge status: Home / Self Care | Payer: Medicaid Other | Source: Ambulatory Visit | Attending: Advanced Practice Midwife | Admitting: Advanced Practice Midwife

## 2018-04-07 ENCOUNTER — Other Ambulatory Visit (HOSPITAL_COMMUNITY): Payer: Self-pay | Admitting: Maternal & Fetal Medicine

## 2018-04-07 ENCOUNTER — Other Ambulatory Visit (HOSPITAL_COMMUNITY): Payer: Self-pay | Admitting: *Deleted

## 2018-04-07 VITALS — BP 112/68 | HR 93 | Wt 184.1 lb

## 2018-04-07 DIAGNOSIS — O0993 Supervision of high risk pregnancy, unspecified, third trimester: Secondary | ICD-10-CM

## 2018-04-07 DIAGNOSIS — Z8759 Personal history of other complications of pregnancy, childbirth and the puerperium: Secondary | ICD-10-CM | POA: Insufficient documentation

## 2018-04-07 DIAGNOSIS — O09523 Supervision of elderly multigravida, third trimester: Secondary | ICD-10-CM | POA: Diagnosis not present

## 2018-04-07 DIAGNOSIS — O26893 Other specified pregnancy related conditions, third trimester: Secondary | ICD-10-CM | POA: Diagnosis not present

## 2018-04-07 DIAGNOSIS — Z3A35 35 weeks gestation of pregnancy: Secondary | ICD-10-CM

## 2018-04-07 DIAGNOSIS — O09293 Supervision of pregnancy with other poor reproductive or obstetric history, third trimester: Secondary | ICD-10-CM

## 2018-04-07 DIAGNOSIS — O24410 Gestational diabetes mellitus in pregnancy, diet controlled: Secondary | ICD-10-CM

## 2018-04-07 DIAGNOSIS — O34219 Maternal care for unspecified type scar from previous cesarean delivery: Secondary | ICD-10-CM

## 2018-04-07 DIAGNOSIS — O24419 Gestational diabetes mellitus in pregnancy, unspecified control: Secondary | ICD-10-CM

## 2018-04-07 DIAGNOSIS — O09292 Supervision of pregnancy with other poor reproductive or obstetric history, second trimester: Secondary | ICD-10-CM

## 2018-04-07 DIAGNOSIS — O0990 Supervision of high risk pregnancy, unspecified, unspecified trimester: Secondary | ICD-10-CM

## 2018-04-07 NOTE — Progress Notes (Signed)
   PRENATAL VISIT NOTE  Subjective:  Barbara Todd is a 37 y.o. [id number] at 1966w5d being seen today for ongoing prenatal care.  She is currently monitored for the following issues for this high-risk pregnancy and has Supervision of high risk pregnancy, antepartum; History of gestational diabetes; History of cesarean delivery, antepartum; Prior pregnancy with fetal demise, antepartum; Unwanted fertility; Gestational diabetes; and Abnormal glucose tolerance test (GTT) during pregnancy, antepartum on their problem list.  Patient reports no complaints.  Contractions: Irritability. Vag. Bleeding: None.  Movement: Present. Denies leaking of fluid.   The following portions of the patient's history were reviewed and updated as appropriate: allergies, current medications, past family history, past medical history, past social history, past surgical history and problem list. Problem list updated.  Objective:   Vitals:   04/07/18 1109  BP: 112/68  Pulse: 93  Weight: 83.5 kg    Fetal Status: Fetal Heart Rate (bpm): 138   Movement: Present     General:  Alert, oriented and cooperative. Patient is in no acute distress.  Skin: Skin is warm and dry. No rash noted.   Cardiovascular: Normal heart rate noted  Respiratory: Normal respiratory effort, no problems with respiration noted  Abdomen: Soft, gravid, appropriate for gestational age.  Pain/Pressure: Present     Pelvic: Cervical exam deferred        Extremities: Normal range of motion.  Edema: None  Mental Status: Normal mood and affect. Normal behavior. Normal judgment and thought content.   Assessment and Plan:  Pregnancy: [id number] at 6566w5d  1. Supervision of high risk pregnancy, antepartum --Anticipatory guidance about next visits/weeks of pregnancy given.  2. History of cesarean delivery, antepartum --Plans repeat  3. Gestational diabetes mellitus (GDM) in third trimester, gestational diabetes method of control unspecified --Glucose log  reviewed, Fastings all 80s, PP below 120.   4. Prior pregnancy with fetal demise and current pregnancy in second trimester --In antenatal testing.  Had US at MFM today, follow ups ordered.  Preterm labor symptoms and general obstetric precautions including but not limited to vaginal bleeding, contractions, leaking of fluid and fetal movement were reviewed in detail with the patient. Please refer to After Visit Summary for other counseling recommendations.  Return in about 2 weeks (around 04/21/2018).  Future Appointments  Date Time Provider Department Center  04/11/2018 11:15 AM [facility] [place] 4 [facility]  04/14/2018  9:30 AM [facility] US 1 [facility]  04/21/2018  9:15 AM [facility] US 4 [facility]  04/22/2018  9:45 AM Constant, Gigi GinPeggy, MD [facility] None  04/28/2018  9:15 AM [facility] US 4 [facility]    Sharen CounterLisa Leftwich-Kirby, CNM

## 2018-04-07 NOTE — Procedures (Signed)
Barbara Todd 23-Mar-1981 6957w5d  Fetus A Non-Stress Test Interpretation for 04/07/18  Indication: previous pregnancy with IUFD  Fetal Heart Rate A Mode: External Baseline Rate (A): 140 bpm Variability: Moderate Accelerations: 15 x 15 Decelerations: None Multiple birth?: No  Uterine Activity Mode: Toco Contraction Frequency (min): one UC noted Contraction Duration (sec): 50 Contraction Quality: Mild Resting Tone Palpated: Relaxed Resting Time: Adequate  Interpretation (Fetal Testing) Nonstress Test Interpretation: Reactive Comments: FHR tracing rev'd by Dr. Judeth CornfieldShankar

## 2018-04-07 NOTE — Patient Instructions (Signed)
Labor Precautions Reasons to come to MAU:  1.  Contractions are  5 minutes apart or less, each last 1 minute, these have been going on for 1-2 hours, and you cannot walk or talk during them 2.  You have a large gush of fluid, or a trickle of fluid that will not stop and you have to wear a pad 3.  You have bleeding that is bright red, heavier than spotting--like menstrual bleeding (spotting can be normal in early labor or after a check of your cervix) 4.  You do not feel the baby moving like he/she normally does    Third Trimester of Pregnancy The third trimester is from week 28 through week 40 (months 7 through 9). The third trimester is a time when the unborn baby (fetus) is growing rapidly. At the end of the ninth month, the fetus is about 20 inches in length and weighs 6-10 pounds. Body changes during your third trimester Your body will continue to go through many changes during pregnancy. The changes vary from woman to woman. During the third trimester:  Your weight will continue to increase. You can expect to gain 25-35 pounds (11-16 kg) by the end of the pregnancy.  You may begin to get stretch marks on your hips, abdomen, and breasts.  You may urinate more often because the fetus is moving lower into your pelvis and pressing on your bladder.  You may develop or continue to have heartburn. This is caused by increased hormones that slow down muscles in the digestive tract.  You may develop or continue to have constipation because increased hormones slow digestion and cause the muscles that push waste through your intestines to relax.  You may develop hemorrhoids. These are swollen veins (varicose veins) in the rectum that can itch or be painful.  You may develop swollen, bulging veins (varicose veins) in your legs.  You may have increased body aches in the pelvis, back, or thighs. This is due to weight gain and increased hormones that are relaxing your joints.  You may have  changes in your hair. These can include thickening of your hair, rapid growth, and changes in texture. Some women also have hair loss during or after pregnancy, or hair that feels dry or thin. Your hair will most likely return to normal after your baby is born.  Your breasts will continue to grow and they will continue to become tender. A yellow fluid (colostrum) may leak from your breasts. This is the first milk you are producing for your baby.  Your belly button may stick out.  You may notice more swelling in your hands, face, or ankles.  You may have increased tingling or numbness in your hands, arms, and legs. The skin on your belly may also feel numb.  You may feel short of breath because of your expanding uterus.  You may have more problems sleeping. This can be caused by the size of your belly, increased need to urinate, and an increase in your body's metabolism.  You may notice the fetus "dropping," or moving lower in your abdomen (lightening).  You may have increased vaginal discharge.  You may notice your joints feel loose and you may have pain around your pelvic bone.  What to expect at prenatal visits You will have prenatal exams every 2 weeks until week 36. Then you will have weekly prenatal exams. During a routine prenatal visit:  You will be weighed to make sure you and the baby are growing   normally.  Your blood pressure will be taken.  Your abdomen will be measured to track your baby's growth.  The fetal heartbeat will be listened to.  Any test results from the previous visit will be discussed.  You may have a cervical check near your due date to see if your cervix has softened or thinned (effaced).  You will be tested for Group B streptococcus. This happens between 35 and 37 weeks.  Your health care provider may ask you:  What your birth plan is.  How you are feeling.  If you are feeling the baby move.  If you have had any abnormal symptoms, such as  leaking fluid, bleeding, severe headaches, or abdominal cramping.  If you are using any tobacco products, including cigarettes, chewing tobacco, and electronic cigarettes.  If you have any questions.  Other tests or screenings that may be performed during your third trimester include:  Blood tests that check for low iron levels (anemia).  Fetal testing to check the health, activity level, and growth of the fetus. Testing is done if you have certain medical conditions or if there are problems during the pregnancy.  Nonstress test (NST). This test checks the health of your baby to make sure there are no signs of problems, such as the baby not getting enough oxygen. During this test, a belt is placed around your belly. The baby is made to move, and its heart rate is monitored during movement.  What is false labor? False labor is a condition in which you feel small, irregular tightenings of the muscles in the womb (contractions) that usually go away with rest, changing position, or drinking water. These are called Braxton Hicks contractions. Contractions may last for hours, days, or even weeks before true labor sets in. If contractions come at regular intervals, become more frequent, increase in intensity, or become painful, you should see your health care provider. What are the signs of labor?  Abdominal cramps.  Regular contractions that start at 10 minutes apart and become stronger and more frequent with time.  Contractions that start on the top of the uterus and spread down to the lower abdomen and back.  Increased pelvic pressure and dull back pain.  A watery or bloody mucus discharge that comes from the vagina.  Leaking of amniotic fluid. This is also known as your "water breaking." It could be a slow trickle or a gush. Let your health care provider know if it has a color or strange odor. If you have any of these signs, call your health care provider right away, even if it is before  your due date. Follow these instructions at home: Medicines  Follow your health care provider's instructions regarding medicine use. Specific medicines may be either safe or unsafe to take during pregnancy.  Take a prenatal vitamin that contains at least 600 micrograms (mcg) of folic acid.  If you develop constipation, try taking a stool softener if your health care provider approves. Eating and drinking  Eat a balanced diet that includes fresh fruits and vegetables, whole grains, good sources of protein such as meat, eggs, or tofu, and low-fat dairy. Your health care provider will help you determine the amount of weight gain that is right for you.  Avoid raw meat and uncooked cheese. These carry germs that can cause birth defects in the baby.  If you have low calcium intake from food, talk to your health care provider about whether you should take a daily calcium supplement.    Eat four or five small meals rather than three large meals a day.  Limit foods that are high in fat and processed sugars, such as fried and sweet foods.  To prevent constipation: ? Drink enough fluid to keep your urine clear or pale yellow. ? Eat foods that are high in fiber, such as fresh fruits and vegetables, whole grains, and beans. Activity  Exercise only as directed by your health care provider. Most women can continue their usual exercise routine during pregnancy. Try to exercise for 30 minutes at least 5 days a week. Stop exercising if you experience uterine contractions.  Avoid heavy lifting.  Do not exercise in extreme heat or humidity, or at high altitudes.  Wear low-heel, comfortable shoes.  Practice good posture.  You may continue to have sex unless your health care provider tells you otherwise. Relieving pain and discomfort  Take frequent breaks and rest with your legs elevated if you have leg cramps or low back pain.  Take warm sitz baths to soothe any pain or discomfort caused by  hemorrhoids. Use hemorrhoid cream if your health care provider approves.  Wear a good support bra to prevent discomfort from breast tenderness.  If you develop varicose veins: ? Wear support pantyhose or compression stockings as told by your healthcare provider. ? Elevate your feet for 15 minutes, 3-4 times a day. Prenatal care  Write down your questions. Take them to your prenatal visits.  Keep all your prenatal visits as told by your health care provider. This is important. Safety  Wear your seat belt at all times when driving.  Make a list of emergency phone numbers, including numbers for family, friends, the hospital, and police and fire departments. General instructions  Avoid cat litter boxes and soil used by cats. These carry germs that can cause birth defects in the baby. If you have a cat, ask someone to clean the litter box for you.  Do not travel far distances unless it is absolutely necessary and only with the approval of your health care provider.  Do not use hot tubs, steam rooms, or saunas.  Do not drink alcohol.  Do not use any products that contain nicotine or tobacco, such as cigarettes and e-cigarettes. If you need help quitting, ask your health care provider.  Do not use any medicinal herbs or unprescribed drugs. These chemicals affect the formation and growth of the baby.  Do not douche or use tampons or scented sanitary pads.  Do not cross your legs for long periods of time.  To prepare for the arrival of your baby: ? Take prenatal classes to understand, practice, and ask questions about labor and delivery. ? Make a trial run to the hospital. ? Visit the hospital and tour the maternity area. ? Arrange for maternity or paternity leave through employers. ? Arrange for family and friends to take care of pets while you are in the hospital. ? Purchase a rear-facing car seat and make sure you know how to install it in your car. ? Pack your hospital  bag. ? Prepare the baby's nursery. Make sure to remove all pillows and stuffed animals from the baby's crib to prevent suffocation.  Visit your dentist if you have not gone during your pregnancy. Use a soft toothbrush to brush your teeth and be gentle when you floss. Contact a health care provider if:  You are unsure if you are in labor or if your water has broken.  You become dizzy.  You have   mild pelvic cramps, pelvic pressure, or nagging pain in your abdominal area.  You have lower back pain.  You have persistent nausea, vomiting, or diarrhea.  You have an unusual or bad smelling vaginal discharge.  You have pain when you urinate. Get help right away if:  Your water breaks before 37 weeks.  You have regular contractions less than 5 minutes apart before 37 weeks.  You have a fever.  You are leaking fluid from your vagina.  You have spotting or bleeding from your vagina.  You have severe abdominal pain or cramping.  You have rapid weight loss or weight gain.  You have shortness of breath with chest pain.  You notice sudden or extreme swelling of your face, hands, ankles, feet, or legs.  Your baby makes fewer than 10 movements in 2 hours.  You have severe headaches that do not go away when you take medicine.  You have vision changes. Summary  The third trimester is from week 28 through week 40, months 7 through 9. The third trimester is a time when the unborn baby (fetus) is growing rapidly.  During the third trimester, your discomfort may increase as you and your baby continue to gain weight. You may have abdominal, leg, and back pain, sleeping problems, and an increased need to urinate.  During the third trimester your breasts will keep growing and they will continue to become tender. A yellow fluid (colostrum) may leak from your breasts. This is the first milk you are producing for your baby.  False labor is a condition in which you feel small, irregular  tightenings of the muscles in the womb (contractions) that eventually go away. These are called Braxton Hicks contractions. Contractions may last for hours, days, or even weeks before true labor sets in.  Signs of labor can include: abdominal cramps; regular contractions that start at 10 minutes apart and become stronger and more frequent with time; watery or bloody mucus discharge that comes from the vagina; increased pelvic pressure and dull back pain; and leaking of amniotic fluid. This information is not intended to replace advice given to you by your health care provider. Make sure you discuss any questions you have with your health care provider. Document Released: 04/24/2001 Document Revised: 10/06/2015 Document Reviewed: 07/01/2012 Elsevier Interactive Patient Education  2017 [employer]  

## 2018-04-11 ENCOUNTER — Ambulatory Visit (HOSPITAL_COMMUNITY)
Admission: RE | Admit: 2018-04-11 | Discharge: 2018-04-11 | Disposition: A | Discharge status: Home / Self Care | Payer: Medicaid Other | Source: Ambulatory Visit | Attending: Family Medicine | Admitting: Family Medicine

## 2018-04-11 ENCOUNTER — Inpatient Hospital Stay (HOSPITAL_COMMUNITY)
Admission: AD | Admit: 2018-04-11 | Discharge: 2018-04-11 | Disposition: A | Discharge status: Home / Self Care | Payer: Medicaid Other | Source: Ambulatory Visit | Attending: Obstetrics & Gynecology | Admitting: Obstetrics & Gynecology

## 2018-04-11 ENCOUNTER — Encounter (HOSPITAL_COMMUNITY): Payer: Self-pay

## 2018-04-11 DIAGNOSIS — Z3689 Encounter for other specified antenatal screening: Secondary | ICD-10-CM | POA: Diagnosis present

## 2018-04-11 DIAGNOSIS — O09293 Supervision of pregnancy with other poor reproductive or obstetric history, third trimester: Secondary | ICD-10-CM | POA: Insufficient documentation

## 2018-04-11 DIAGNOSIS — Z3A36 36 weeks gestation of pregnancy: Secondary | ICD-10-CM | POA: Insufficient documentation

## 2018-04-11 DIAGNOSIS — O09523 Supervision of elderly multigravida, third trimester: Secondary | ICD-10-CM

## 2018-04-11 DIAGNOSIS — O24410 Gestational diabetes mellitus in pregnancy, diet controlled: Secondary | ICD-10-CM | POA: Diagnosis not present

## 2018-04-11 DIAGNOSIS — O34219 Maternal care for unspecified type scar from previous cesarean delivery: Secondary | ICD-10-CM | POA: Diagnosis not present

## 2018-04-11 DIAGNOSIS — O09893 Supervision of other high risk pregnancies, third trimester: Secondary | ICD-10-CM | POA: Diagnosis not present

## 2018-04-11 NOTE — MAU Provider Note (Signed)
Pt sent from MFM for NST d/t 6/8 BPP (no movement). Has diet controlled GDM.  Baby now active per pt.   NST: FHR baseline 145 bpm, Variability: moderate, Accelerations:present, Decelerations:  Absent= Cat 1/Reactive.  Discussed w/Dr. Macon LargeAnyanwu, may DC home. F/U on Monday as scheduled.

## 2018-04-11 NOTE — MAU Note (Signed)
Pt here from MFM for NST, BPP of 6/8. Hx of IUFD, gest diab.  Pt denies bleeding, no pain- just pressure.

## 2018-04-11 NOTE — Discharge Instructions (Signed)

## 2018-04-14 ENCOUNTER — Ambulatory Visit (HOSPITAL_COMMUNITY)
Admission: RE | Admit: 2018-04-14 | Discharge: 2018-04-14 | Disposition: A | Discharge status: Home / Self Care | Payer: Medicaid Other | Source: Ambulatory Visit | Attending: Advanced Practice Midwife | Admitting: Advanced Practice Midwife

## 2018-04-14 ENCOUNTER — Encounter (HOSPITAL_COMMUNITY): Payer: Self-pay

## 2018-04-14 DIAGNOSIS — O09523 Supervision of elderly multigravida, third trimester: Secondary | ICD-10-CM | POA: Diagnosis not present

## 2018-04-14 DIAGNOSIS — Z8759 Personal history of other complications of pregnancy, childbirth and the puerperium: Secondary | ICD-10-CM

## 2018-04-14 DIAGNOSIS — O34219 Maternal care for unspecified type scar from previous cesarean delivery: Secondary | ICD-10-CM

## 2018-04-14 DIAGNOSIS — Z3A36 36 weeks gestation of pregnancy: Secondary | ICD-10-CM | POA: Insufficient documentation

## 2018-04-14 DIAGNOSIS — O09293 Supervision of pregnancy with other poor reproductive or obstetric history, third trimester: Secondary | ICD-10-CM | POA: Diagnosis not present

## 2018-04-14 DIAGNOSIS — O24410 Gestational diabetes mellitus in pregnancy, diet controlled: Secondary | ICD-10-CM | POA: Insufficient documentation

## 2018-04-14 DIAGNOSIS — O09893 Supervision of other high risk pregnancies, third trimester: Secondary | ICD-10-CM | POA: Insufficient documentation

## 2018-04-18 ENCOUNTER — Encounter (HOSPITAL_COMMUNITY): Payer: Self-pay

## 2018-04-21 ENCOUNTER — Encounter (HOSPITAL_COMMUNITY): Payer: Self-pay

## 2018-04-21 ENCOUNTER — Inpatient Hospital Stay (HOSPITAL_COMMUNITY): Payer: Medicaid Other | Admitting: Anesthesiology

## 2018-04-21 ENCOUNTER — Ambulatory Visit (HOSPITAL_BASED_OUTPATIENT_CLINIC_OR_DEPARTMENT_OTHER)
Admission: RE | Admit: 2018-04-21 | Discharge: 2018-04-21 | Disposition: A | Discharge status: Home / Self Care | Payer: Medicaid Other | Source: Ambulatory Visit | Attending: Advanced Practice Midwife | Admitting: Advanced Practice Midwife

## 2018-04-21 ENCOUNTER — Encounter (HOSPITAL_COMMUNITY)
Admission: AD | Disposition: A | Discharge status: Home / Self Care | Payer: Self-pay | Source: Home / Self Care | Attending: Family Medicine

## 2018-04-21 ENCOUNTER — Other Ambulatory Visit: Payer: Self-pay

## 2018-04-21 ENCOUNTER — Other Ambulatory Visit (HOSPITAL_COMMUNITY): Payer: Self-pay | Admitting: Maternal & Fetal Medicine

## 2018-04-21 ENCOUNTER — Inpatient Hospital Stay (HOSPITAL_COMMUNITY)
Admission: AD | Admit: 2018-04-21 | Discharge: 2018-04-23 | DRG: 785 | Disposition: A | Discharge status: Home / Self Care | Payer: Medicaid Other | Attending: Family Medicine | Admitting: Family Medicine

## 2018-04-21 DIAGNOSIS — O24410 Gestational diabetes mellitus in pregnancy, diet controlled: Secondary | ICD-10-CM

## 2018-04-21 DIAGNOSIS — D649 Anemia, unspecified: Secondary | ICD-10-CM | POA: Diagnosis present

## 2018-04-21 DIAGNOSIS — O4103X0 Oligohydramnios, third trimester, not applicable or unspecified: Secondary | ICD-10-CM

## 2018-04-21 DIAGNOSIS — O09523 Supervision of elderly multigravida, third trimester: Secondary | ICD-10-CM | POA: Diagnosis not present

## 2018-04-21 DIAGNOSIS — Z3A37 37 weeks gestation of pregnancy: Secondary | ICD-10-CM | POA: Diagnosis not present

## 2018-04-21 DIAGNOSIS — O34219 Maternal care for unspecified type scar from previous cesarean delivery: Secondary | ICD-10-CM | POA: Diagnosis not present

## 2018-04-21 DIAGNOSIS — O34211 Maternal care for low transverse scar from previous cesarean delivery: Secondary | ICD-10-CM | POA: Diagnosis present

## 2018-04-21 DIAGNOSIS — Z8759 Personal history of other complications of pregnancy, childbirth and the puerperium: Secondary | ICD-10-CM

## 2018-04-21 DIAGNOSIS — O9902 Anemia complicating childbirth: Secondary | ICD-10-CM | POA: Diagnosis present

## 2018-04-21 DIAGNOSIS — O09529 Supervision of elderly multigravida, unspecified trimester: Secondary | ICD-10-CM

## 2018-04-21 DIAGNOSIS — L910 Hypertrophic scar: Secondary | ICD-10-CM

## 2018-04-21 DIAGNOSIS — O289 Unspecified abnormal findings on antenatal screening of mother: Secondary | ICD-10-CM | POA: Diagnosis present

## 2018-04-21 DIAGNOSIS — O24419 Gestational diabetes mellitus in pregnancy, unspecified control: Secondary | ICD-10-CM | POA: Diagnosis present

## 2018-04-21 DIAGNOSIS — Z3009 Encounter for other general counseling and advice on contraception: Secondary | ICD-10-CM | POA: Diagnosis present

## 2018-04-21 DIAGNOSIS — O0990 Supervision of high risk pregnancy, unspecified, unspecified trimester: Secondary | ICD-10-CM

## 2018-04-21 DIAGNOSIS — Z302 Encounter for sterilization: Secondary | ICD-10-CM

## 2018-04-21 DIAGNOSIS — O24420 Gestational diabetes mellitus in childbirth, diet controlled: Secondary | ICD-10-CM | POA: Diagnosis present

## 2018-04-21 DIAGNOSIS — O09293 Supervision of pregnancy with other poor reproductive or obstetric history, third trimester: Secondary | ICD-10-CM | POA: Diagnosis not present

## 2018-04-21 LAB — GLUCOSE, CAPILLARY: Glucose-Capillary: 101 mg/dL — ABNORMAL HIGH (ref 70–99)

## 2018-04-21 LAB — CBC
HCT: 40.3 % (ref 36.0–46.0)
Hemoglobin: 13.5 g/dL (ref 12.0–15.0)
MCH: 32.1 pg (ref 26.0–34.0)
MCHC: 33.5 g/dL (ref 30.0–36.0)
MCV: 95.7 fL (ref 80.0–100.0)
Platelets: 261 10*3/uL (ref 150–400)
RBC: 4.21 MIL/uL (ref 3.87–5.11)
RDW: 14.6 % (ref 11.5–15.5)
WBC: 9 10*3/uL (ref 4.0–10.5)
nRBC: 0 % (ref 0.0–0.2)

## 2018-04-21 LAB — PREPARE RBC (CROSSMATCH)

## 2018-04-21 SURGERY — Surgical Case
Anesthesia: Spinal | Site: Abdomen | Wound class: Clean Contaminated

## 2018-04-21 MED ORDER — MORPHINE SULFATE (PF) 0.5 MG/ML IJ SOLN
INTRAMUSCULAR | Status: AC
  Filled 2018-04-21: qty 10

## 2018-04-21 MED ORDER — TETANUS-DIPHTH-ACELL PERTUSSIS 5-2.5-18.5 LF-MCG/0.5 IM SUSP: 0.5000 mL | Freq: Once | INTRAMUSCULAR | Status: DC

## 2018-04-21 MED ORDER — MEPERIDINE HCL 25 MG/ML IJ SOLN: 6.2500 mg | INTRAMUSCULAR | Status: DC | PRN

## 2018-04-21 MED ORDER — NALOXONE HCL 0.4 MG/ML IJ SOLN: 0.4000 mg | INTRAMUSCULAR | Status: DC | PRN

## 2018-04-21 MED ORDER — MENTHOL 3 MG MT LOZG: 1.0000 | LOZENGE | OROMUCOSAL | Status: DC | PRN

## 2018-04-21 MED ORDER — NALBUPHINE HCL 10 MG/ML IJ SOLN
INTRAMUSCULAR | Status: AC
  Administered 2018-04-22: 5 mg via INTRAVENOUS
  Filled 2018-04-21: qty 1

## 2018-04-21 MED ORDER — FENTANYL CITRATE (PF) 100 MCG/2ML IJ SOLN: 100.0000 ug | INTRAMUSCULAR | Status: DC | PRN

## 2018-04-21 MED ORDER — ENOXAPARIN SODIUM 40 MG/0.4ML ~~LOC~~ SOLN
40.0000 mg | SUBCUTANEOUS | Status: DC
  Administered 2018-04-22 – 2018-04-23 (×2): 40 mg via SUBCUTANEOUS
  Filled 2018-04-21 (×2): qty 0.4

## 2018-04-21 MED ORDER — STERILE WATER FOR IRRIGATION IR SOLN
Status: DC | PRN
  Administered 2018-04-21: 1000 mL

## 2018-04-21 MED ORDER — ONDANSETRON HCL 4 MG/2ML IJ SOLN: 4.0000 mg | Freq: Four times a day (QID) | INTRAMUSCULAR | Status: DC | PRN

## 2018-04-21 MED ORDER — NALOXONE HCL 4 MG/10ML IJ SOLN: 1.0000 ug/kg/h | INTRAVENOUS | Status: DC | PRN

## 2018-04-21 MED ORDER — LACTATED RINGERS IV SOLN: 500.0000 mL | INTRAVENOUS | Status: DC | PRN

## 2018-04-21 MED ORDER — BUPIVACAINE HCL (PF) 0.5 % IJ SOLN
INTRAMUSCULAR | Status: AC
  Filled 2018-04-21: qty 30

## 2018-04-21 MED ORDER — KETOROLAC TROMETHAMINE 30 MG/ML IJ SOLN
INTRAMUSCULAR | Status: AC
  Filled 2018-04-21: qty 1

## 2018-04-21 MED ORDER — BUPIVACAINE IN DEXTROSE 0.75-8.25 % IT SOLN
INTRATHECAL | Status: DC | PRN
  Administered 2018-04-21: 1.7 mg via INTRATHECAL

## 2018-04-21 MED ORDER — KETOROLAC TROMETHAMINE 30 MG/ML IJ SOLN
30.0000 mg | Freq: Four times a day (QID) | INTRAMUSCULAR | Status: DC | PRN
  Administered 2018-04-21: 30 mg via INTRAMUSCULAR

## 2018-04-21 MED ORDER — PROMETHAZINE HCL 25 MG/ML IJ SOLN: 6.2500 mg | INTRAMUSCULAR | Status: DC | PRN

## 2018-04-21 MED ORDER — NALBUPHINE HCL 10 MG/ML IJ SOLN: 5.0000 mg | INTRAMUSCULAR | Status: DC | PRN

## 2018-04-21 MED ORDER — PRENATAL MULTIVITAMIN CH
1.0000 | ORAL_TABLET | Freq: Every day | ORAL | Status: DC
  Administered 2018-04-22 – 2018-04-23 (×2): 1 via ORAL
  Filled 2018-04-21 (×2): qty 1

## 2018-04-21 MED ORDER — OXYTOCIN BOLUS FROM INFUSION: 500.0000 mL | Freq: Once | INTRAVENOUS | Status: DC

## 2018-04-21 MED ORDER — DIPHENHYDRAMINE HCL 25 MG PO CAPS
25.0000 mg | ORAL_CAPSULE | ORAL | Status: DC | PRN
  Administered 2018-04-21 – 2018-04-22 (×3): 25 mg via ORAL
  Filled 2018-04-21 (×2): qty 1

## 2018-04-21 MED ORDER — SODIUM CHLORIDE 0.9% FLUSH: 3.0000 mL | INTRAVENOUS | Status: DC | PRN

## 2018-04-21 MED ORDER — SIMETHICONE 80 MG PO CHEW: 80.0000 mg | CHEWABLE_TABLET | ORAL | Status: DC | PRN

## 2018-04-21 MED ORDER — WITCH HAZEL-GLYCERIN EX PADS: 1.0000 "application " | MEDICATED_PAD | CUTANEOUS | Status: DC | PRN

## 2018-04-21 MED ORDER — CEFAZOLIN SODIUM-DEXTROSE 2-4 GM/100ML-% IV SOLN
2.0000 g | INTRAVENOUS | Status: AC
  Administered 2018-04-21: 2 g via INTRAVENOUS
  Filled 2018-04-21: qty 100

## 2018-04-21 MED ORDER — NALBUPHINE HCL 10 MG/ML IJ SOLN: 5.0000 mg | Freq: Once | INTRAMUSCULAR | Status: DC | PRN

## 2018-04-21 MED ORDER — SIMETHICONE 80 MG PO CHEW
80.0000 mg | CHEWABLE_TABLET | ORAL | Status: DC
  Administered 2018-04-21 – 2018-04-22 (×2): 80 mg via ORAL
  Filled 2018-04-21 (×2): qty 1

## 2018-04-21 MED ORDER — OXYTOCIN 40 UNITS IN LACTATED RINGERS INFUSION - SIMPLE MED: 2.5000 [IU]/h | INTRAVENOUS | Status: DC

## 2018-04-21 MED ORDER — KETOROLAC TROMETHAMINE 30 MG/ML IJ SOLN
30.0000 mg | Freq: Four times a day (QID) | INTRAMUSCULAR | Status: DC | PRN
  Administered 2018-04-21: 30 mg via INTRAVENOUS
  Filled 2018-04-21: qty 1

## 2018-04-21 MED ORDER — COCONUT OIL OIL: 1.0000 "application " | TOPICAL_OIL | Status: DC | PRN

## 2018-04-21 MED ORDER — SCOPOLAMINE 1 MG/3DAYS TD PT72
MEDICATED_PATCH | TRANSDERMAL | Status: DC | PRN
  Administered 2018-04-21: 1 via TRANSDERMAL

## 2018-04-21 MED ORDER — ACETAMINOPHEN 325 MG PO TABS: 650.0000 mg | ORAL_TABLET | ORAL | Status: DC | PRN

## 2018-04-21 MED ORDER — ZOLPIDEM TARTRATE 5 MG PO TABS: 5.0000 mg | ORAL_TABLET | Freq: Every evening | ORAL | Status: DC | PRN

## 2018-04-21 MED ORDER — OXYTOCIN 10 UNIT/ML IJ SOLN
INTRAVENOUS | Status: DC | PRN
  Administered 2018-04-21: 40 [IU] via INTRAVENOUS

## 2018-04-21 MED ORDER — ACETAMINOPHEN 10 MG/ML IV SOLN: 1000.0000 mg | Freq: Once | INTRAVENOUS | Status: DC | PRN

## 2018-04-21 MED ORDER — ONDANSETRON HCL 4 MG/2ML IJ SOLN
INTRAMUSCULAR | Status: DC | PRN
  Administered 2018-04-21: 4 mg via INTRAVENOUS

## 2018-04-21 MED ORDER — SENNOSIDES-DOCUSATE SODIUM 8.6-50 MG PO TABS
2.0000 | ORAL_TABLET | ORAL | Status: DC
  Administered 2018-04-21 – 2018-04-22 (×2): 2 via ORAL
  Filled 2018-04-21 (×2): qty 2

## 2018-04-21 MED ORDER — LACTATED RINGERS IV SOLN
INTRAVENOUS | Status: DC
  Administered 2018-04-22: 04:00:00 via INTRAVENOUS

## 2018-04-21 MED ORDER — SIMETHICONE 80 MG PO CHEW
80.0000 mg | CHEWABLE_TABLET | Freq: Three times a day (TID) | ORAL | Status: DC
  Administered 2018-04-22 – 2018-04-23 (×4): 80 mg via ORAL
  Filled 2018-04-21 (×5): qty 1

## 2018-04-21 MED ORDER — LACTATED RINGERS IV SOLN
INTRAVENOUS | Status: DC
  Administered 2018-04-21 (×3): via INTRAVENOUS

## 2018-04-21 MED ORDER — ONDANSETRON HCL 4 MG/2ML IJ SOLN
INTRAMUSCULAR | Status: AC
  Filled 2018-04-21: qty 2

## 2018-04-21 MED ORDER — PHENYLEPHRINE 8 MG IN D5W 100 ML (0.08MG/ML) PREMIX OPTIME
INJECTION | INTRAVENOUS | Status: AC
  Filled 2018-04-21: qty 100

## 2018-04-21 MED ORDER — SCOPOLAMINE 1 MG/3DAYS TD PT72: 1.0000 | MEDICATED_PATCH | Freq: Once | TRANSDERMAL | Status: DC

## 2018-04-21 MED ORDER — DIPHENHYDRAMINE HCL 50 MG/ML IJ SOLN
12.5000 mg | INTRAMUSCULAR | Status: DC | PRN
  Administered 2018-04-21: 12.5 mg via INTRAVENOUS

## 2018-04-21 MED ORDER — HYDROMORPHONE HCL 1 MG/ML IJ SOLN: 0.2500 mg | INTRAMUSCULAR | Status: DC | PRN

## 2018-04-21 MED ORDER — PHENYLEPHRINE 8 MG IN D5W 100 ML (0.08MG/ML) PREMIX OPTIME
INJECTION | INTRAVENOUS | Status: DC | PRN
  Administered 2018-04-21: 60 ug/min via INTRAVENOUS

## 2018-04-21 MED ORDER — FENTANYL CITRATE (PF) 100 MCG/2ML IJ SOLN
INTRAMUSCULAR | Status: DC | PRN
  Administered 2018-04-21: 15 ug via INTRATHECAL

## 2018-04-21 MED ORDER — SOD CITRATE-CITRIC ACID 500-334 MG/5ML PO SOLN
30.0000 mL | ORAL | Status: DC | PRN
  Administered 2018-04-21: 30 mL via ORAL
  Filled 2018-04-21: qty 15

## 2018-04-21 MED ORDER — OXYCODONE-ACETAMINOPHEN 5-325 MG PO TABS: 1.0000 | ORAL_TABLET | ORAL | Status: DC | PRN

## 2018-04-21 MED ORDER — MORPHINE SULFATE (PF) 0.5 MG/ML IJ SOLN
INTRAMUSCULAR | Status: DC | PRN
  Administered 2018-04-21: .15 mg via INTRATHECAL

## 2018-04-21 MED ORDER — SCOPOLAMINE 1 MG/3DAYS TD PT72
MEDICATED_PATCH | TRANSDERMAL | Status: AC
  Filled 2018-04-21: qty 1

## 2018-04-21 MED ORDER — DIPHENHYDRAMINE HCL 50 MG/ML IJ SOLN
INTRAMUSCULAR | Status: AC
  Filled 2018-04-21: qty 1

## 2018-04-21 MED ORDER — NALBUPHINE HCL 10 MG/ML IJ SOLN
5.0000 mg | INTRAMUSCULAR | Status: DC | PRN
  Administered 2018-04-21 – 2018-04-22 (×4): 5 mg via INTRAVENOUS
  Filled 2018-04-21 (×3): qty 1

## 2018-04-21 MED ORDER — FENTANYL CITRATE (PF) 100 MCG/2ML IJ SOLN
INTRAMUSCULAR | Status: AC
  Filled 2018-04-21: qty 2

## 2018-04-21 MED ORDER — HYDROCODONE-ACETAMINOPHEN 7.5-325 MG PO TABS: 1.0000 | ORAL_TABLET | Freq: Once | ORAL | Status: DC | PRN

## 2018-04-21 MED ORDER — DIPHENHYDRAMINE HCL 25 MG PO CAPS
25.0000 mg | ORAL_CAPSULE | Freq: Four times a day (QID) | ORAL | Status: DC | PRN
  Filled 2018-04-21: qty 1

## 2018-04-21 MED ORDER — ONDANSETRON HCL 4 MG/2ML IJ SOLN: 4.0000 mg | Freq: Three times a day (TID) | INTRAMUSCULAR | Status: DC | PRN

## 2018-04-21 MED ORDER — DEXAMETHASONE SODIUM PHOSPHATE 10 MG/ML IJ SOLN
INTRAMUSCULAR | Status: DC | PRN
  Administered 2018-04-21: 10 mg via INTRAVENOUS

## 2018-04-21 MED ORDER — DIBUCAINE 1 % RE OINT: 1.0000 "application " | TOPICAL_OINTMENT | RECTAL | Status: DC | PRN

## 2018-04-21 MED ORDER — OXYTOCIN 10 UNIT/ML IJ SOLN
INTRAMUSCULAR | Status: AC
  Filled 2018-04-21: qty 4

## 2018-04-21 MED ORDER — OXYTOCIN 40 UNITS IN LACTATED RINGERS INFUSION - SIMPLE MED: 2.5000 [IU]/h | INTRAVENOUS | Status: AC

## 2018-04-21 MED ORDER — BUPIVACAINE HCL (PF) 0.5 % IJ SOLN
INTRAMUSCULAR | Status: DC | PRN
  Administered 2018-04-21: 30 mL

## 2018-04-21 MED ORDER — SODIUM CHLORIDE 0.9 % IR SOLN
Status: DC | PRN
  Administered 2018-04-21: 1000 mL

## 2018-04-21 SURGICAL SUPPLY — 34 items
BENZOIN TINCTURE PRP APPL 2/3 (GAUZE/BANDAGES/DRESSINGS) ×3 IMPLANT
CHLORAPREP W/TINT 26ML (MISCELLANEOUS) ×3 IMPLANT
CLAMP CORD UMBIL (MISCELLANEOUS) ×3 IMPLANT
CLIP FILSHIE TUBAL LIGA STRL (Clip) ×3 IMPLANT
CLOSURE WOUND 1/2 X4 (GAUZE/BANDAGES/DRESSINGS) ×1
CLOTH BEACON ORANGE TIMEOUT ST (SAFETY) ×3 IMPLANT
DRSG OPSITE POSTOP 4X10 (GAUZE/BANDAGES/DRESSINGS) ×3 IMPLANT
ELECT REM PT RETURN 9FT ADLT (ELECTROSURGICAL) ×3
ELECTRODE REM PT RTRN 9FT ADLT (ELECTROSURGICAL) ×1 IMPLANT
GAUZE SPONGE 4X4 12PLY STRL LF (GAUZE/BANDAGES/DRESSINGS) ×3 IMPLANT
GLOVE BIOGEL PI IND STRL 7.0 (GLOVE) ×2 IMPLANT
GLOVE BIOGEL PI IND STRL 7.5 (GLOVE) ×2 IMPLANT
GLOVE BIOGEL PI INDICATOR 7.0 (GLOVE) ×4
GLOVE BIOGEL PI INDICATOR 7.5 (GLOVE) ×4
GLOVE ECLIPSE 7.5 STRL STRAW (GLOVE) ×3 IMPLANT
GOWN STRL REUS W/TWL LRG LVL3 (GOWN DISPOSABLE) ×9 IMPLANT
NS IRRIG 1000ML POUR BTL (IV SOLUTION) ×3 IMPLANT
PACK C SECTION WH (CUSTOM PROCEDURE TRAY) ×3 IMPLANT
PAD ABD 8X7 1/2 STERILE (GAUZE/BANDAGES/DRESSINGS) ×3 IMPLANT
PAD OB MATERNITY 4.3X12.25 (PERSONAL CARE ITEMS) ×3 IMPLANT
PENCIL SMOKE EVAC W/HOLSTER (ELECTROSURGICAL) ×3 IMPLANT
RTRCTR C-SECT PINK 25CM LRG (MISCELLANEOUS) ×3 IMPLANT
SPONGE LAP 18X18 RF (DISPOSABLE) ×9 IMPLANT
STRIP CLOSURE SKIN 1/2X4 (GAUZE/BANDAGES/DRESSINGS) ×2 IMPLANT
SUT PLAIN 0 NONE (SUTURE) ×3 IMPLANT
SUT VIC AB 0 CT1 36 (SUTURE) ×3 IMPLANT
SUT VIC AB 0 CTX 36 (SUTURE) ×4
SUT VIC AB 0 CTX36XBRD ANBCTRL (SUTURE) ×2 IMPLANT
SUT VIC AB 2-0 CT1 27 (SUTURE) ×2
SUT VIC AB 2-0 CT1 TAPERPNT 27 (SUTURE) ×1 IMPLANT
SUT VIC AB 4-0 KS 27 (SUTURE) ×3 IMPLANT
TAPE CLOTH SURG 4X10 WHT LF (GAUZE/BANDAGES/DRESSINGS) ×3 IMPLANT
TOWEL OR 17X24 6PK STRL BLUE (TOWEL DISPOSABLE) ×3 IMPLANT
TRAY FOLEY W/BAG SLVR 14FR LF (SET/KITS/TRAYS/PACK) ×3 IMPLANT

## 2018-04-21 NOTE — Anesthesia Procedure Notes (Signed)
Spinal  Patient location during procedure: OB Start time: 04/21/2018 3:03 PM End time: 04/21/2018 3:08 PM Staffing Anesthesiologist: Trevor IhaHouser, Rosa Wyly A, MD Performed: anesthesiologist  Preanesthetic Checklist Completed: patient identified, surgical consent, pre-op evaluation, timeout performed, IV checked, risks and benefits discussed and monitors and equipment checked Spinal Block Patient position: sitting Prep: site prepped and draped and DuraPrep Patient monitoring: heart rate, cardiac monitor, continuous pulse ox and blood pressure Approach: midline Location: L3-4 Injection technique: single-shot Needle Needle type: Pencan  Needle gauge: 24 G Needle length: 10 cm Needle insertion depth: 7 cm Assessment Sensory level: T4 Additional Notes 1  Attempt (s). Pt tolerated procedure well.

## 2018-04-21 NOTE — MAU Note (Signed)
Pt sent from MFM office for 2/10 BPP.  RCS x5 scheduled.
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
## 2018-04-21 NOTE — Transfer of Care (Signed)
Immediate Anesthesia Transfer of Care Note  Patient: Barbara Todd  Procedure(s) Performed: REPEAT CESAREAN SECTION WITH BILATERAL TUBAL LIGATION (N/A Abdomen)  Patient Location: PACU  Anesthesia Type:Spinal  Level of Consciousness: awake  Airway & Oxygen Therapy: Patient Spontanous Breathing  Post-op Assessment: Report given to RN and Post -op Vital signs reviewed and stable  Post vital signs: stable  Last Vitals:  Vitals Value Taken Time  BP 113/66 04/21/2018  4:47 PM  Temp    Pulse 84 04/21/2018  4:48 PM  Resp 13 04/21/2018  4:48 PM  SpO2 98 % 04/21/2018  4:48 PM  Vitals shown include unvalidated device data.  Last Pain:  Vitals:   04/21/18 1429  PainSc: 0-No pain         Complications: No apparent anesthesia complications

## 2018-04-21 NOTE — ED Notes (Signed)
Report called to Ginger Morris, RN, CN in MAU of BPP 2/8, hx IUFD, for repeat C/S and BTL (scheduled on 12/18). Patient escorted to MAU registration.

## 2018-04-21 NOTE — MAU Note (Signed)
Pt sent from MFM for delivery, repeat C/S. BPP 2/8 today.

## 2018-04-21 NOTE — Anesthesia Preprocedure Evaluation (Signed)
Anesthesia Evaluation  Patient identified by MRN, date of birth, ID band Patient awake  General Assessment Comment:Pt last ate at 0830  Reviewed: Allergy & Precautions, NPO status , Patient's Chart, lab work & pertinent test results  Airway Mallampati: II  TM Distance: >3 FB Neck ROM: Full    Dental no notable dental hx. (+) Teeth Intact, Dental Advisory Given   Pulmonary neg pulmonary ROS,    Pulmonary exam normal breath sounds clear to auscultation       Cardiovascular negative cardio ROS Normal cardiovascular exam Rhythm:Regular Rate:Normal     Neuro/Psych negative neurological ROS  negative psych ROS   GI/Hepatic negative GI ROS, Neg liver ROS,   Endo/Other  diabetes, Gestational  Renal/GU negative Renal ROS     Musculoskeletal  (+) Arthritis ,   Abdominal   Peds  Hematology negative hematology ROS (+)   Anesthesia Other Findings Biophysical profile 2/8  Reproductive/Obstetrics (+) Pregnancy                             Lab Results  Component Value Date   WBC 9.0 04/21/2018   HGB 13.5 04/21/2018   HCT 40.3 04/21/2018   MCV 95.7 04/21/2018   PLT 261 04/21/2018    Anesthesia Physical Anesthesia Plan  ASA: III  Anesthesia Plan: Spinal   Post-op Pain Management:    Induction:   PONV Risk Score and Plan: 3 and Treatment may vary due to age or medical condition, Ondansetron and Dexamethasone  Airway Management Planned: Natural Airway  Additional Equipment:   Intra-op Plan:   Post-operative Plan:   Informed Consent: I have reviewed the patients History and Physical, chart, labs and discussed the procedure including the risks, benefits and alternatives for the proposed anesthesia with the patient or authorized representative who has indicated his/her understanding and acceptance.   Dental advisory given  Plan Discussed with:   Anesthesia Plan Comments: (Pt last ate at  0830 C/S after 1430)        Anesthesia Quick Evaluation

## 2018-04-21 NOTE — Progress Notes (Signed)
CBG in PACU 86

## 2018-04-21 NOTE — Op Note (Signed)
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 [name]  ASSISTANT:  Dr. Gwenevere Abbot, Dr. Jaynie Collins  ANESTHESIOLOGY TEAM: Anesthesiologist: Trevor Iha, MD CRNA: Renford Dills, CRNA  INDICATIONS: Barbara Todd is a 37 y.o. [id number] at [redacted]w[redacted]d here for cesarean section secondary to the indications listed under preoperative diagnoses; please see preoperative note for further details.  The risks of cesarean section were discussed with the patient including but were not limited to: bleeding which may require transfusion or reoperation; infection which may require antibiotics; injury to bowel, bladder, ureters or other surrounding organs; injury to the fetus; need for additional procedures including hysterectomy in the event of a life-threatening hemorrhage; placental abnormalities wth subsequent pregnancies, incisional problems, thromboembolic phenomenon and other postoperative/anesthesia complications.   The patient concurred with the proposed plan, giving informed written consent for the procedure.    Patient with undesired fertility, desires permanent sterilization. Risks and benefits of procedure discussed with patient including permanence of method, bleeding, infection, injury to surrounding organs and need for additional procedures. Risk failure of 0.5-1% with increased risk of ectopic gestation if pregnancy occurs was also discussed with patient. Patient verbalized understanding and all questions were answered. Filshie clips discussed and possible future menstrual problems.  FINDINGS:  Viable female infant in cephalic presentation.  Apgars 9 and 9.  Clear  amniotic fluid.  Intact placenta, three vessel cord.  Normal uterus, fallopian tubes and ovaries bilaterally.  ANESTHESIA: Spinal  INTRAVENOUS FLUIDS: 2400 ml   ESTIMATED BLOOD LOSS: 669 ml URINE OUTPUT:  450 ml SPECIMENS: Placenta sent to L&D COMPLICATIONS: None immediate  PROCEDURE IN DETAIL:  The patient preoperatively received intravenous antibiotics and had sequential compression devices applied to her lower extremities.  She was then taken to the operating room where spinal anesthesia was administered and was found to be adequate. She was then placed in a dorsal supine position with a leftward tilt, and prepped and draped in a sterile manner.  A foley catheter was placed into her bladder and attached to constant gravity.  After an adequate timeout was performed, a Pfannenstiel skin incision was made with scalpel immediately superior to her preexisting scar and carried through to the underlying layer of fascia. The fascia was incised in the midline, and this incision was extended bilaterally using the Mayo scissors.  Kocher clamps were applied to the superior aspect of the fascial incision and the underlying rectus muscles were dissected off bluntly and sharply.  A similar process was carried out on the inferior aspect of the fascial incision. The rectus muscles were separated in the midline and the peritoneum was entered bluntly and sharply using Mayo scissors. The Alexis self-retaining retractor was introduced into the abdominal cavity.  Attention was turned to the lower uterine segment where a low transverse hysterotomy was made with a scalpel and extended bilaterally bluntly.  The infant was successfully delivered, the cord was clamped and cut after one minute, and the infant was handed over to the awaiting neonatology team. Uterine massage was then administered, and the placenta delivered intact with a three-vessel cord. The uterus was then cleared of clots and debris.  The hysterotomy was closed  with 0 Vicryl in a running locked fashion, and an imbricating layer  was also placed with 0 Vicryl.  The pelvis was cleared of all clot and debris. Hemostasis was confirmed on all surfaces.  The retractor was removed.    Attention was then turned to the patient's right fallopian tube, which was identified and followed out to the fimbriated end.  A Filshie clip was placed on the right fallopian tube about 2 cm from the cornual attachment, with care given to incorporate the underlying mesosalpinx.  A similar process was carried out on the left side allowing for bilateral tubal sterilization.  Good hemostasis was noted overall.  The peritoneum was closed with a 0 Vicryl running stitch. The fascia was then closed using 0 Vicryl in a running fashion.  The subcutaneous layer was irrigated, and the patient's previous skin incision was excised. The skin was closed with a 4-0 Vicryl subcuticular stitch. The patient tolerated the procedure well. Sponge, instrument and needle counts were correct x 3.  She was taken to the recovery room in stable condition.   Gwenevere AbbotNimeka Bridgitt Raggio, MD Ob Fellow, [facility], [facility]

## 2018-04-22 ENCOUNTER — Encounter (HOSPITAL_COMMUNITY): Payer: Self-pay | Admitting: Obstetrics & Gynecology

## 2018-04-22 ENCOUNTER — Encounter: Payer: Medicaid Other | Admitting: Obstetrics and Gynecology

## 2018-04-22 LAB — CBC
HEMATOCRIT: 34.5 % — AB (ref 36.0–46.0)
Hemoglobin: 11.4 g/dL — ABNORMAL LOW (ref 12.0–15.0)
MCH: 31.4 pg (ref 26.0–34.0)
MCHC: 33 g/dL (ref 30.0–36.0)
MCV: 95 fL (ref 80.0–100.0)
PLATELETS: 233 10*3/uL (ref 150–400)
RBC: 3.63 MIL/uL — ABNORMAL LOW (ref 3.87–5.11)
RDW: 14.4 % (ref 11.5–15.5)
WBC: 14.4 10*3/uL — ABNORMAL HIGH (ref 4.0–10.5)
nRBC: 0 % (ref 0.0–0.2)

## 2018-04-22 LAB — RPR: RPR Ser Ql: NONREACTIVE

## 2018-04-22 LAB — GLUCOSE, CAPILLARY: Glucose-Capillary: 86 mg/dL (ref 70–99)

## 2018-04-22 MED ORDER — IBUPROFEN 600 MG PO TABS
600.0000 mg | ORAL_TABLET | Freq: Four times a day (QID) | ORAL | Status: DC | PRN
  Administered 2018-04-22 – 2018-04-23 (×5): 600 mg via ORAL
  Filled 2018-04-22 (×5): qty 1

## 2018-04-22 NOTE — Discharge Instructions (Signed)

## 2018-04-22 NOTE — Anesthesia Postprocedure Evaluation (Signed)
Anesthesia Post Note  Patient: Barbara Todd  Procedure(s) Performed: REPEAT CESAREAN SECTION WITH BILATERAL TUBAL LIGATION (N/A Abdomen)     Patient location during evaluation: Mother Baby Anesthesia Type: Spinal Level of consciousness: oriented and awake and alert Pain management: pain level controlled Vital Signs Assessment: post-procedure vital signs reviewed and stable Respiratory status: spontaneous breathing and respiratory function stable Cardiovascular status: blood pressure returned to baseline and stable Postop Assessment: no headache, no backache, no apparent nausea or vomiting and able to ambulate Anesthetic complications: no    Last Vitals:  Vitals:   04/22/18 0507 04/22/18 0508  BP: 114/70 113/77  Pulse: 75 79  Resp:    Temp:    SpO2: 99% 97%    Last Pain:  Vitals:   04/22/18 0356  TempSrc: Oral  PainSc:    Pain Goal:                 Trevor IhaStephen A Hailei Besser

## 2018-04-22 NOTE — Lactation Note (Addendum)
This note was copied from a baby's chart. Lactation Consultation Note  Patient Name: Barbara Todd EHOZY'Y Date: 04/22/2018 Reason for consult: Initial assessment;Maternal endocrine disorder Type of Endocrine Disorder?: Diabetes  P5, 9 hour female infant, C/S delivery and mom with GBM in pregnancy. Per mom, infant had one wet diaper. Per mom,  she actively receives [facility] in [place] entered room mom doesn't want to latch infant to breast at this time due to excessive itching nurse is aware. Currently mom is not latching infant to breast her choice. Mom feeding choice at admission  was breastfeeding , but currently she  is breastfeeding by pumping and formula. LC explain how to use pump and mom plans to pump every 3 hours to help stimulate and induce milk supply. Mom shown how to use DEBP & how to disassemble, clean, & reassemble parts. LC also encourage mom to hand express. LC discussed I & O. Mom knows to BF or supplement according hunger cues and not exceed 3 hours without feeding infant. Mom made aware of O/P services, breastfeeding support groups, community resources, and our phone # for post-discharge questions.  Mom will ask Nurse or LC to help assist with latching infant to breast if she decides to do so.   Maternal Data Formula Feeding for Exclusion: No  Feeding Feeding Type: Breast Fed  LATCH Score                   Interventions Interventions: Breast feeding basics reviewed;DEBP  Lactation Tools Discussed/Used WIC Program: Yes Pump Review: Setup, frequency, and cleaning;Milk Storage Initiated by:: Vicente Serene, IBCLC Date initiated:: 04/22/18   Consult Status Consult Status: Follow-up Date: 04/22/18 Follow-up type: In-patient    Vicente Serene 04/22/2018, 1:11 AM

## 2018-04-22 NOTE — Lactation Note (Signed)
This note was copied from a baby's chart. Lactation Consultation Note  Patient Name: Barbara Todd [id number]'ZToday's Date: 04/22/2018 Reason for consult: Follow-up assessment;Early term 37-38.6wks Type of Endocrine Disorder?: Diabetes  P5 mother whose infant is now 3225 hours old.  This is an ETI at 37+5 weeks.  Baby was sleeping in mother's lap as I arrived.  Mother had no questions/concerns related to breast feeding.  She does have  breast feeding experience.  Mother is breast feeding/ bottle feeding.  Infant is supplementing well.  Mother does not require assistance at this time but will call for assistance as needed.     Maternal Data Formula Feeding for Exclusion: No Has patient been taught Hand Expression?: Yes Does the patient have breastfeeding experience prior to this delivery?: Yes  Feeding    LATCH Score                   Interventions    Lactation Tools Discussed/Used WIC Program: Yes Initiated by:: Already initiated   Consult Status Consult Status: Follow-up Date: 04/23/18 Follow-up type: In-patient    Barbara Todd 04/22/2018, 5:11 PM

## 2018-04-22 NOTE — Discharge Summary (Signed)
Postpartum Discharge Summary     Patient Name: Barbara Todd DOB: 1981-03-29 MRN: [id number]  Date of admission: 04/21/2018 Delivering Provider: [place] Mode   Date of discharge: 04/23/2018  Admitting diagnosis: 37wks, BPP 2 out of 8 Intrauterine pregnancy: [redacted]w[redacted]d    Secondary diagnosis:  Principal Problem:   Abnormal findings on antenatal screening Active Problems:   Supervision of high risk pregnancy, antepartum   History of cesarean delivery, antepartum   Unwanted fertility   Gestational diabetes   Delivery by elective cesarean section   Advanced maternal age in multigravida, unspecified trimester   Incisional Keloid  Additional problems: None     Discharge diagnosis: Term Pregnancy Delivered                                                                                                Post partum procedures:postpartum tubal ligation  Augmentation: None  Complications: None  Hospital course:  Sceduled C/S   37y.o. yo [id number] admitted to the hospital 04/21/2018 for scheduled cesarean section with the following indication:Elective Repeat and Non-Reassuring FHR.  Membrane Rupture Time/Date: 3:33 PM ,04/21/2018   Patient delivered a Viable infant.04/21/2018  Details of operation can be found in separate operative note.  Pateint had an uncomplicated postpartum course.  She is ambulating, tolerating a regular diet, passing flatus, and urinating well. Patient is discharged home in stable condition on  04/23/18         Magnesium Sulfate recieved: No BMZ received: No  Physical exam  Vitals:   04/22/18 1235 04/22/18 1441 04/22/18 2326 04/23/18 0650  BP: 112/67 108/64 91/78 113/70  Pulse: 84 76 88 87  Resp: '16 18 18 18  ' Temp: 98.5 F (36.9 C) 98.1 F (36.7 C) 98.5 F (36.9 C) 98 F (36.7 C)  TempSrc: Oral Oral Oral Oral  SpO2: 100%      General: alert, cooperative and no distress Lochia: appropriate Uterine Fundus: firm Incision: Healing well with  no significant drainage, No significant erythema, Dressing is clean, dry, and intact DVT Evaluation: No evidence of DVT seen on physical exam. Negative Homan's sign. No cords or calf tenderness. No significant calf/ankle edema. Labs: Lab Results  Component Value Date   WBC 14.4 (H) 04/22/2018   HGB 11.4 (L) 04/22/2018   HCT 34.5 (L) 04/22/2018   MCV 95.0 04/22/2018   PLT 233 04/22/2018   CMP Latest Ref Rng & Units 01/22/2017  Glucose 65 - 99 mg/dL 69  BUN 6 - 20 mg/dL <5(L)  Creatinine 0.44 - 1.00 mg/dL 0.64  Sodium 135 - 145 mmol/L 136  Potassium 3.5 - 5.1 mmol/L 3.8  Chloride 101 - 111 mmol/L 103  CO2 22 - 32 mmol/L 23  Calcium 8.9 - 10.3 mg/dL 9.1  Total Protein 6.5 - 8.1 g/dL 8.0  Total Bilirubin 0.3 - 1.2 mg/dL 0.5  Alkaline Phos 38 - 126 U/L 295(H)  AST 15 - 41 U/L 23  ALT 14 - 54 U/L 24    Discharge instruction: per After Visit Summary and "Baby and Me Booklet".  After visit meds:  Allergies as of 04/23/2018   No Known Allergies     Medication List    STOP taking these medications   ACCU-CHEK FASTCLIX LANCETS Misc   ACCU-CHEK NANO SMARTVIEW w/Device Kit   glucose blood test strip     TAKE these medications   ibuprofen 600 MG tablet Commonly known as:  ADVIL,MOTRIN Take 1 tablet (600 mg total) by mouth every 6 (six) hours as needed for mild pain, moderate pain or cramping.   PRENATE PIXIE 10-0.6-0.4-200 MG Caps Take 1 tablet by mouth daily.       Diet: routine diet  Activity: Advance as tolerated. Pelvic rest for 6 weeks.   Outpatient follow up:4 weeks Follow up Appt: Future Appointments  Date Time Provider [place]  05/05/2018  3:30 PM Chancy Milroy, MD [facility] None  06/03/2018  8:15 AM [facility] None  06/03/2018  8:30 AM Constant, Vickii Chafe, MD [place] None   Follow up Visit: [place] Follow up.   Why:  You will need an incision check in 1-2 weeks and a postpartum appointment in 6  weeks with a diabetes test. Contact information: [address] [place] [address] [phone]           Please schedule this patient for Postpartum visit in: 4 weeks with the following provider: MD For C/S patients schedule nurse incision check in weeks 2 weeks: yes High risk pregnancy complicated by: GDM Delivery mode:  CS Anticipated Birth Control:  BTL done PP PP Procedures needed: 2 hour GTT  Schedule Integrated BH visit: no  Newborn Data: Live born female  Birth Weight: 7 lb 1.4 oz (3215 g) APGAR: 9, 9  Newborn Delivery   Birth date/time:  04/21/2018 15:34:00 Delivery type:  C-Section, Low Transverse Trial of labor:  No C-section categorization:  Repeat     Baby Feeding: Breast Disposition:home with mother   04/23/2018 Aura Camps, MD

## 2018-04-22 NOTE — Progress Notes (Signed)
Subjective: Postpartum Day 1: Cesarean Delivery Patient reports tolerating PO and no problems voiding  Pt denies dizziness with ambulation Pt reports that she is breastfeeding, going well  S/p BTL   Objective: Vital signs in last 24 hours: Temp:  [97.6 F (36.4 C)-98.3 F (36.8 C)] 98 F (36.7 C) (12/10 0356) Pulse Rate:  [72-85] 79 (12/10 0508) Resp:  [13-18] 18 (12/09 1934) BP: (103-123)/(66-93) 113/77 (12/10 0508) SpO2:  [97 %-100 %] 97 % (12/10 [id number])  Physical Exam:  General: alert Lochia: appropriate Uterine Fundus: firm Incision: pressure dressing intact and dry  DVT Evaluation: No evidence of DVT seen on physical exam.  Recent Labs    04/21/18 1123 04/22/18 0527  HGB 13.5 11.4*  HCT 40.3 34.5*    Assessment/Plan: Status post Cesarean section. Doing well postoperatively.  Continue current care. Anticipate discharge on 12/11   Sigurd SosStephenia M Cynara Tatham 04/22/2018, 11:25 AM

## 2018-04-23 ENCOUNTER — Encounter (HOSPITAL_COMMUNITY): Payer: Self-pay

## 2018-04-23 LAB — TYPE AND SCREEN
ABO/RH(D): O POS
Antibody Screen: NEGATIVE
UNIT DIVISION: 0
UNIT DIVISION: 0

## 2018-04-23 LAB — BPAM RBC
Blood Product Expiration Date: 202001042359
Blood Product Expiration Date: 202001042359
Unit Type and Rh: 5100
Unit Type and Rh: 5100

## 2018-04-23 MED ORDER — IBUPROFEN 600 MG PO TABS: 600.0000 mg | ORAL_TABLET | Freq: Four times a day (QID) | ORAL | 0 refills | Status: DC | PRN

## 2018-04-23 NOTE — Progress Notes (Signed)
POSTPARTUM PROGRESS NOTE  POD #2  Subjective:  Barbara Todd is a 37 y.o. [id number] s/p rLTCS at 787w5d.  She reports she doing well. No acute events overnight. She denies any problems with ambulating, voiding or po intake. Denies nausea or vomiting. She has passed flatus. Pain is well controlled.  Lochia is mild.  Objective: Blood pressure 113/70, pulse 87, temperature 98 F (36.7 C), temperature source Oral, resp. rate 18, last menstrual period 07/24/2017, SpO2 100 %, unknown if currently breastfeeding.  Physical Exam:  General: alert, cooperative and no distress Chest: no respiratory distress Heart:regular rate, distal pulses intact Abdomen: soft, nontender,  Uterine Fundus: firm, appropriately tender DVT Evaluation: No calf swelling or tenderness Extremities: no edema  Skin: warm, dry; incision clean/dry/intact w/ honeycomb dressing in place  Recent Labs    04/21/18 1123 04/22/18 0527  HGB 13.5 11.4*  HCT 40.3 34.5*    Assessment/Plan: Barbara Todd is a 37 y.o. [id number] s/p rLTCS at 767w5d.  POD#2 - Doing well; pain well controlled.   Routine postpartum care  OOB, ambulated  Lovenox for VTE prophylaxis Anemia: H/H yesterday showed drop from 13.5 to 11.4. Asymptomatic.  Contraception: BTL Feeding: Breast  Dispo: Plan for discharge tomorrow.   LOS: 2 days   Barbara ChurnJohn Cook, MS3 04/23/2018, 7:49 AM   OB FELLOW POSTPARTUM PROGRESS NOTE ATTESTATION  I have seen and examined this patient and agree with above documentation in the student's note.   Gwenevere AbbotNimeka Lotta Frankenfield, MD OB Fellow  04/23/2018, 9:37 AM

## 2018-04-24 ENCOUNTER — Telehealth: Payer: Self-pay

## 2018-04-24 NOTE — Telephone Encounter (Signed)
Returned call, advised of things to help relive gas after c section, pt stated that her sister went to get her medicine from the pharmacy.

## 2018-04-28 ENCOUNTER — Ambulatory Visit (HOSPITAL_COMMUNITY): Payer: Medicaid Other

## 2018-04-29 ENCOUNTER — Encounter (HOSPITAL_COMMUNITY)
Admission: RE | Admit: 2018-04-29 | Discharge: 2018-04-29 | Disposition: A | Discharge status: Home / Self Care | Payer: Medicaid Other | Source: Ambulatory Visit | Attending: Family Medicine | Admitting: Family Medicine

## 2018-04-30 ENCOUNTER — Inpatient Hospital Stay (HOSPITAL_COMMUNITY): Admit: 2018-04-30 | Payer: Medicaid Other | Admitting: Obstetrics and Gynecology

## 2018-05-05 ENCOUNTER — Ambulatory Visit (INDEPENDENT_AMBULATORY_CARE_PROVIDER_SITE_OTHER): Payer: Medicaid Other | Admitting: Obstetrics and Gynecology

## 2018-05-05 ENCOUNTER — Encounter: Payer: Self-pay | Admitting: Obstetrics and Gynecology

## 2018-05-05 MED ORDER — PRENATE PIXIE 10-0.6-0.4-200 MG PO CAPS: 1.0000 | ORAL_CAPSULE | Freq: Every day | ORAL | 12 refills | Status: DC

## 2018-05-05 NOTE — Progress Notes (Signed)
Ms Barbara Todd is here today for incision check S/p LTCS/BTL on 12.9.19 Doing well no complaints. Ambulating and voiding without problems Tolerating diet Good pain control Breast feeding Bleeding has stopped  PE AF VSS Lungs clear Heart RRR Abd soft + BS  Incision healing well, small area < 1 cm of separation at skin edge, no S/Sx of infection  A/P S/P LTCS/BTL, incision check  Wound care reviewed with pt. Increase ADL's as tolerates. Continue with pelvic rest PP visit with Glucola in 4 weeks

## 2018-05-21 ENCOUNTER — Ambulatory Visit (INDEPENDENT_AMBULATORY_CARE_PROVIDER_SITE_OTHER): Payer: Medicaid Other

## 2018-05-21 VITALS — BP 144/100 | HR 67 | Resp 16 | Ht 62.0 in | Wt 167.2 lb

## 2018-05-21 DIAGNOSIS — Z9889 Other specified postprocedural states: Secondary | ICD-10-CM

## 2018-05-21 NOTE — Progress Notes (Addendum)
Subjective:     Barbara Todd is a 38 y.o. female who presents to the clinic 4 weeks after cesearen delivery reporting yellow odorless discharge from incision site. The patient is not having any pain. Patient reports just soreness at incision site.   The following portions of the patient's history were reviewed and updated as appropriate: allergies, current medications, past surgical history and problem list.  Review of Systems Pertinent items are noted in HPI.    Objective:     General:  alert, cooperative and no distress  Abdomen: soft, bowel sounds active, non-tender  Incision:   no drainage, no erythema, no hernia, no seroma, no swelling. Flesh  Coming from right side of incision.     Assessment:    Doing well postoperatively.   Plan:   1. Continue any current medications. 2. Incision care discussed.  3. Keep Post partum appointment: 06/03/2018. 4. Contact office if incision site gets worse before PP visit. 5. Follow up with your PCP regarding your headache - blood pressure was elevated today.   Shalea Tomczak, Damita Dunnings, CMA   Attestation of Attending Supervision of /CMA/RN: Evaluation and management procedures were performed by the nurse under my supervision and collaboration.  I have reviewed the nursing note and chart, and I agree with the management and plan.  Pt had granulation tissue at the edge of the wound. I treated this with Silver Nitrate. Rec reeval in 4 weeks at the routine PP visit. May need ot have area retreated.   Carolyn L. Harraway-Smith, M.D., FACOG]    Concepcion Elk. Harraway-Smith, M.D., [place]

## 2018-06-03 ENCOUNTER — Encounter: Payer: Self-pay | Admitting: Obstetrics and Gynecology

## 2018-06-03 ENCOUNTER — Other Ambulatory Visit: Payer: Medicaid Other

## 2018-06-03 ENCOUNTER — Ambulatory Visit (INDEPENDENT_AMBULATORY_CARE_PROVIDER_SITE_OTHER): Payer: Medicaid Other | Admitting: Obstetrics and Gynecology

## 2018-06-03 DIAGNOSIS — Z1389 Encounter for screening for other disorder: Secondary | ICD-10-CM

## 2018-06-03 NOTE — Progress Notes (Signed)
Post Partum Exam  Barbara Todd is a 38 y.o. [id number] female who presents for a postpartum visit. She is 5 weeks postpartum following a low cervical transverse Cesarean section. I have fully reviewed the prenatal and intrapartum course. The delivery was at 38 gestational weeks.  Anesthesia: epidural. Postpartum course has been unremarkable. Baby's course has been unremarkable. Baby is feeding by breast. Bleeding no bleeding. Bowel function is normal. Bladder function is normal. Patient is not sexually active. Contraception method is tubal ligation. Postpartum depression screening:neg. She reports receiving ample help from her mother and aunt    Last pap smear done 07/2016 and was Normal  Review of Systems Pertinent items are noted in HPI.    Objective:  Blood pressure 106/71, pulse 78, resp. rate 16, height 5\' 2"  (1.575 m), weight 166 lb 4.8 oz (75.4 kg), currently breastfeeding.  General:  alert, cooperative and no distress   Breasts:  inspection negative, no nipple discharge or bleeding, no masses or nodularity palpable  Lungs: clear to auscultation bilaterally  Heart:  regular rate and rhythm, S1, S2 normal, no murmur, click, rub or gallop  Abdomen: soft, non-tender; bowel sounds normal; no masses,  no organomegaly  Incision healeing well without erythema, induration or drainage   Vulva:  normal  Vagina: normal vagina, no discharge, exudate, lesion, or erythema  Cervix:    Corpus: normal size, contour, position, consistency, mobility, non-tender  Adnexa:  normal adnexa and no mass, fullness, tenderness  Rectal Exam: Not performed.        Assessment:    Normal postpartum exam. Pap smear not done at today's visit.   Plan:   1. Contraception: tubal ligation 2. Patient is medically cleared to return to regular activities. Patient needs to return in a fasting state for postpartum glucola due to GDM in pregnancy 3. Follow up in: 6 months for annual exam or as needed.

## 2018-06-04 ENCOUNTER — Ambulatory Visit: Payer: Medicaid Other

## 2018-06-04 ENCOUNTER — Other Ambulatory Visit: Payer: Medicaid Other

## 2018-06-04 VITALS — BP 107/60

## 2018-06-04 DIAGNOSIS — Z0130 Encounter for examination of blood pressure without abnormal findings: Secondary | ICD-10-CM

## 2018-06-04 NOTE — Addendum Note (Signed)
Addended by: Dalphine Handing on: 06/04/2018 11:34 AM   Modules accepted: Level of Service

## 2018-06-04 NOTE — Progress Notes (Signed)
Pt said PCP requested [place] to check BP during 2 gtt labs today. Normal BP today; pt will inform PCP.

## 2018-06-04 NOTE — Addendum Note (Signed)
Addended by: Burnell Blanks on: 06/04/2018 08:59 AM   Modules accepted: Orders

## 2018-06-05 LAB — GLUCOSE TOLERANCE, 2 HOURS
GLUCOSE FASTING GTT: 87 mg/dL (ref 65–99)
Glucose, 2 hour: 67 mg/dL (ref 65–139)

## 2019-12-01 ENCOUNTER — Ambulatory Visit: Payer: Medicaid Other | Admitting: Diagnostic Neuroimaging

## 2019-12-30 IMAGING — US US MFM OB FOLLOW-UP
1 series · 13 of 28 positions shown · non-contrast
Comparison: none

[Series 1: us mfm ob follow-up · 13 of 39 slices shown]
[im 2/39]
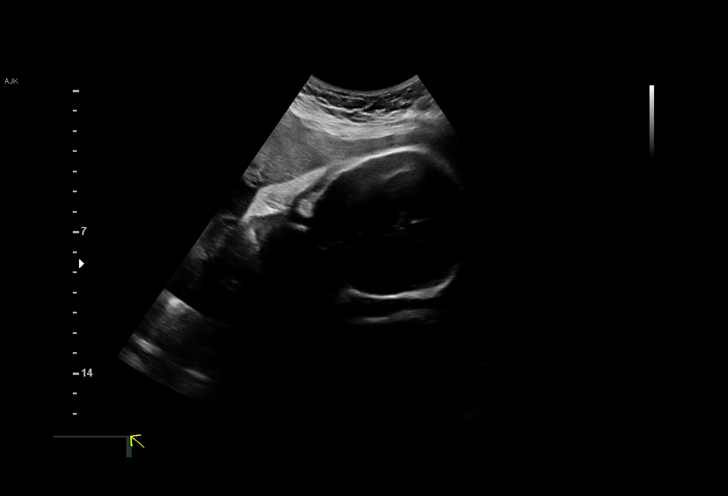
[im 5/39]
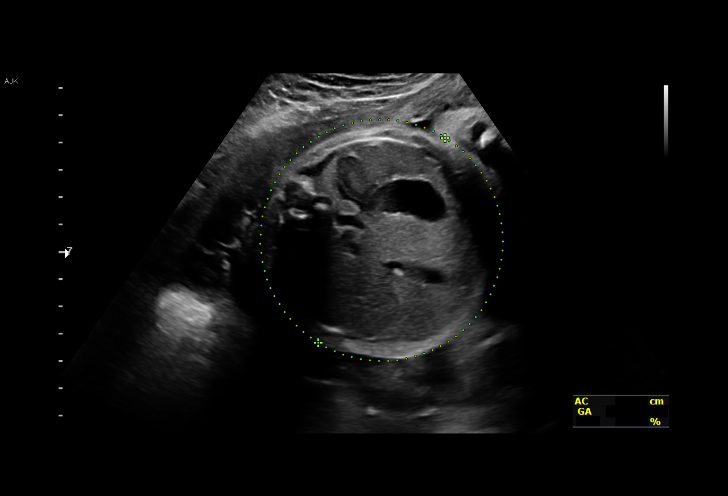
[im 8/39]
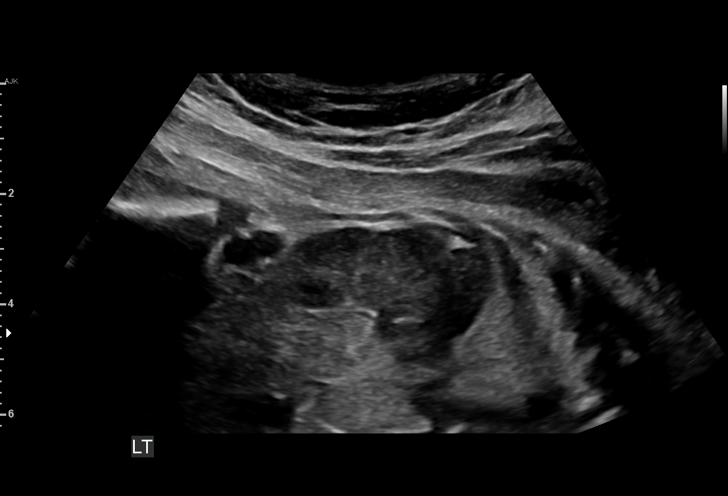
[im 10/39]
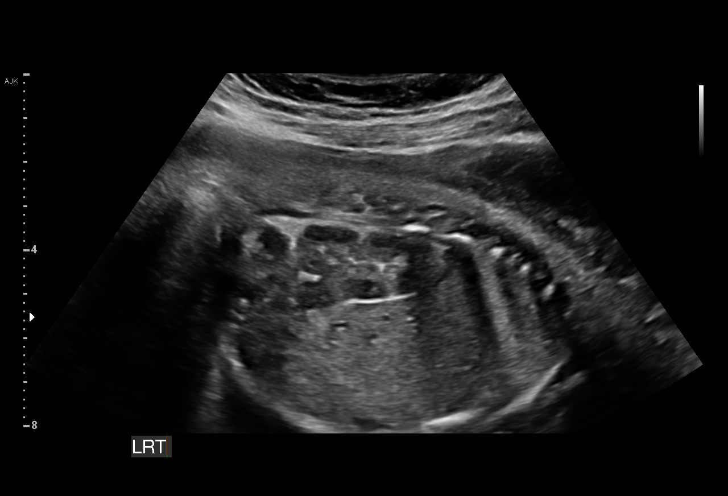
[im 13/39]
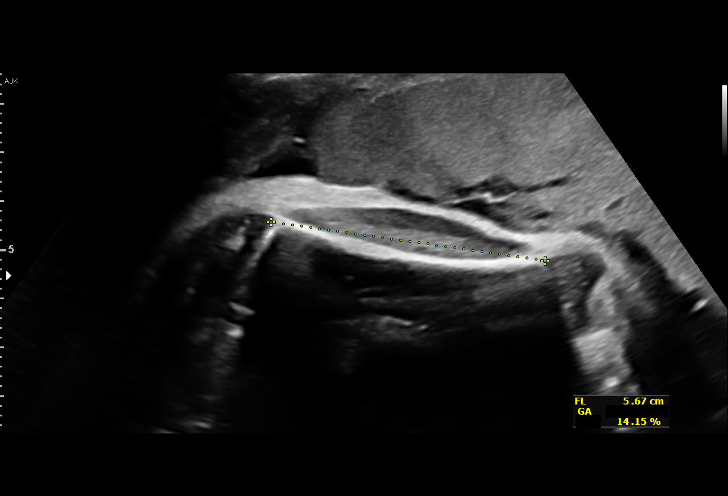
[im 16/39]
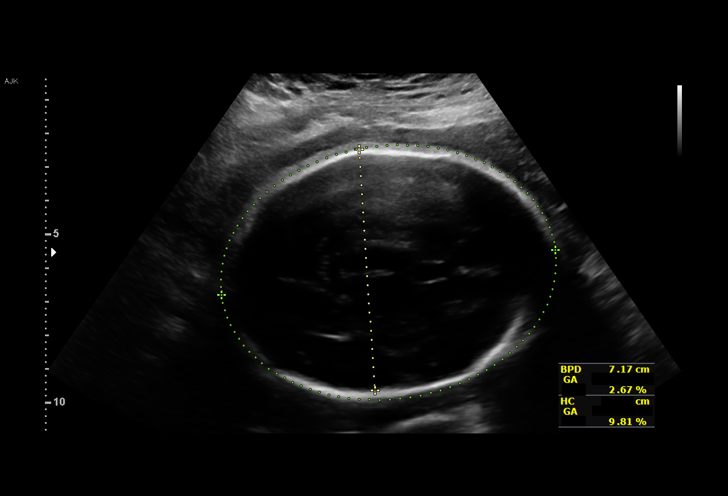
[im 20/39]
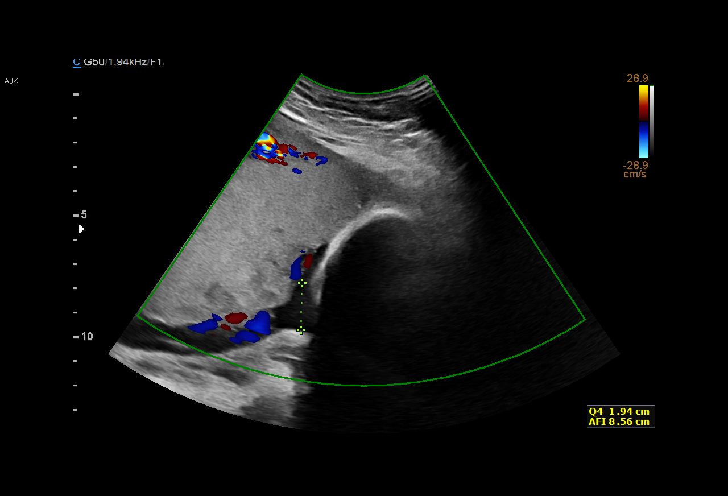
[im 23/39]
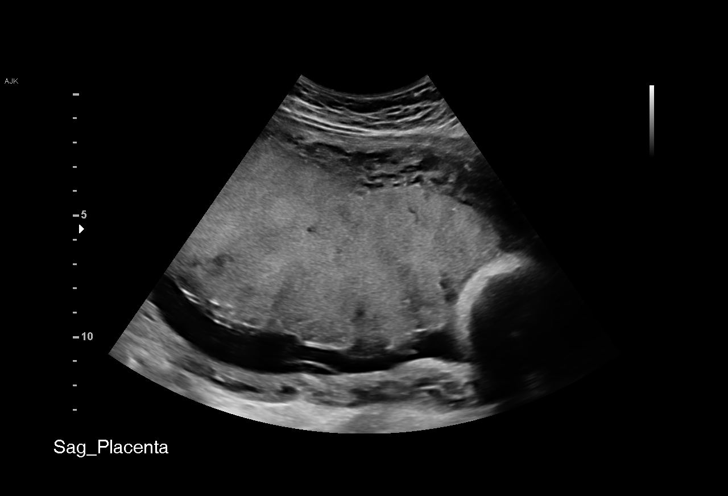
[im 26/39]
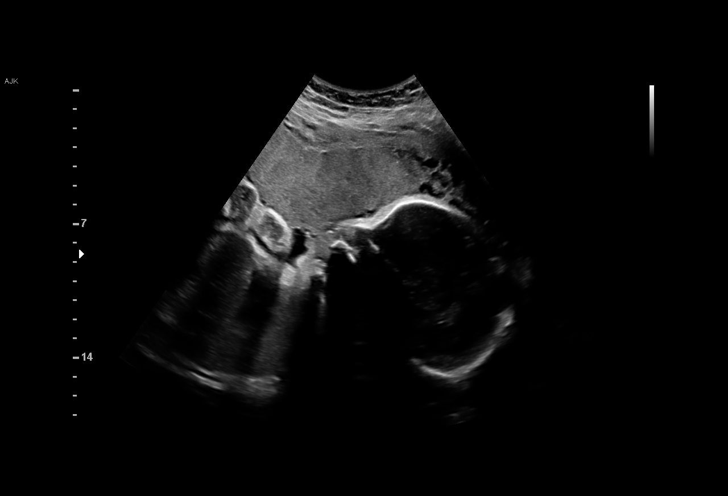
[im 29/39]
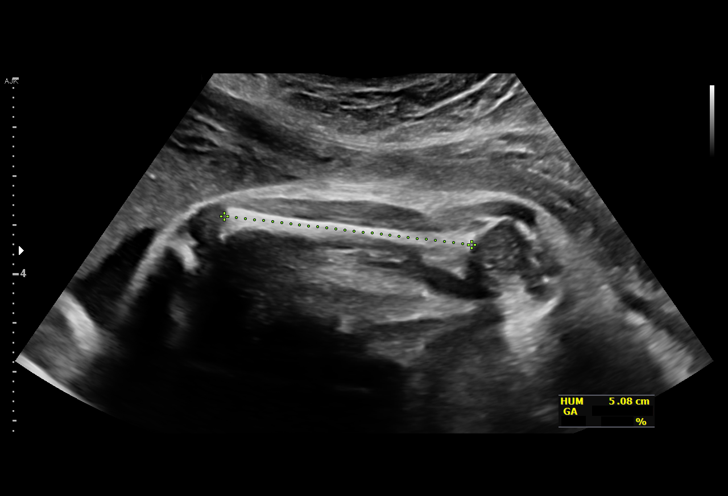
[im 31/39]
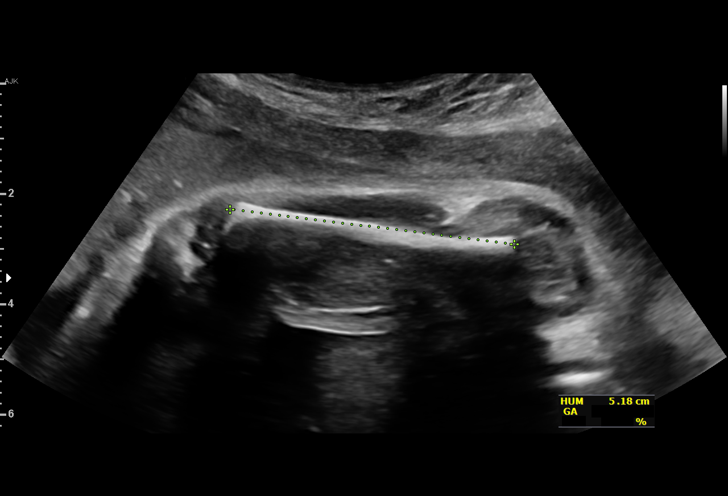
[im 34/39]
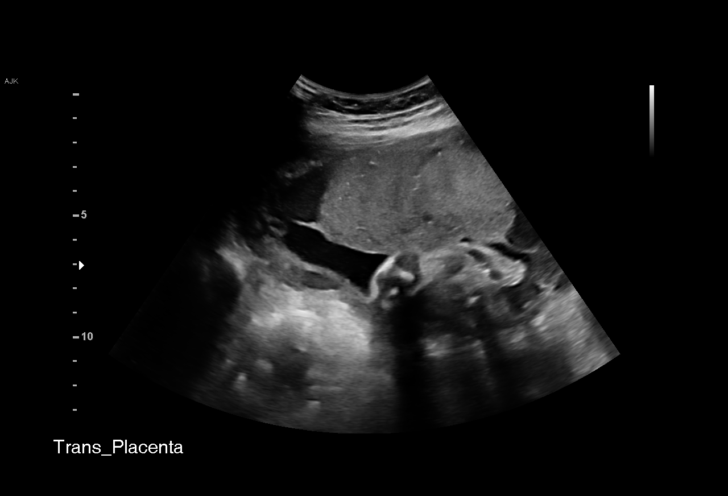
[im 37/39]
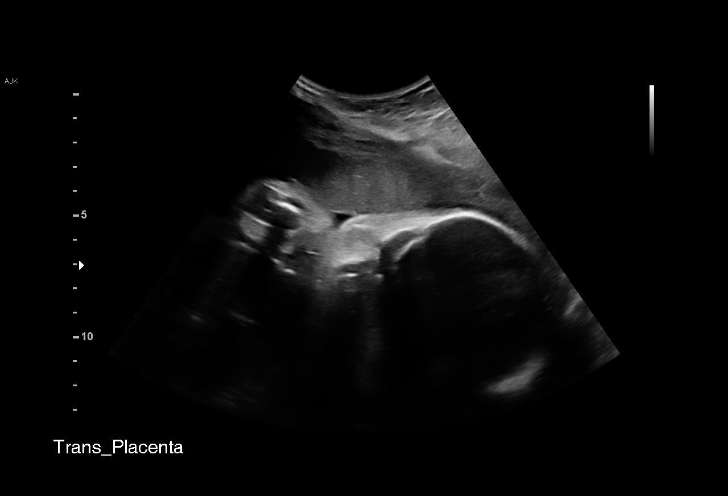

[13 of 28 positions shown; findings below may reference images not displayed]

Indications

30 weeks gestation of pregnancy
Previous cesarean delivery, antepartum x4
Poor obstetric history: Previous IUFD
(stillbirth) (Twin A-at 37 weeks)
Advanced maternal age multigravida 35+,
third trimester (low risk NIPS)
Encounter for other antenatal screening
follow-up
Gestational diabetes in pregnancy, diet
controlled
Short interval between pregancies, 3rd
trimester
Vital Signs

BMI:
Fetal Evaluation

Num Of Fetuses:         1
Fetal Heart Rate(bpm):  153
Cardiac Activity:       Observed
Presentation:           Cephalic
Placenta:               Anterior
P. Cord Insertion:      Visualized, central

Amniotic Fluid
AFI FV:      Within normal limits

AFI Sum(cm)     %Tile       Largest Pocket(cm)
8.56            4
RUQ(cm)       RLQ(cm)       LUQ(cm)        LLQ(cm)
2.31
Biometry

BPD:      71.2  mm     G. Age:  28w 4d          2  %    CI:        69.12   %    70 - 86
FL/HC:      20.7   %    19.3 -
HC:      273.5  mm     G. Age:  29w 6d          5  %    HC/AC:      0.97        0.96 -
AC:      281.9  mm     G. Age:  32w 1d         85  %    FL/BPD:     79.6   %    71 - 87
FL:       56.7  mm     G. Age:  29w 5d         14  %    FL/AC:      20.1   %    20 - 24
HUM:      51.1  mm     G. Age:  30w 0d         35  %

Est. FW:    2775  gm    3 lb 11 oz      59  %
OB History

Gravidity:    8         Term:   4        Prem:   0        SAB:   2
TOP:          1       Ectopic:  0        Living: 4
Gestational Age

LMP:           31w 5d        Date:  07/24/17                 EDD:   04/30/18
U/S Today:     30w 1d                                        EDD:   05/11/18
Best:          30w 5d     Det. By:  U/S  (12/05/17)          EDD:   05/07/18
Anatomy

Cranium:               Appears normal         Aortic Arch:            Previously seen
Cavum:                 Appears normal         Ductal Arch:            Previously seen
Ventricles:            Previously seen        Diaphragm:              Previously seen
Choroid Plexus:        Previously seen        Stomach:                Appears normal, left
sided
Cerebellum:            Previously seen        Abdomen:                Appears normal
Posterior Fossa:       Previously seen        Abdominal Wall:         Previously seen
Nuchal Fold:           Previously seen        Cord Vessels:           Previously seen
Face:                  Appears normal         Kidneys:                Appear normal
(orbits and profile)
Lips:                  Previously seen        Bladder:                Appears normal
Thoracic:              Appears normal         Spine:                  Previously seen
Heart:                 Previously seen        Upper Extremities:      Previously seen
RVOT:                  Previously seen        Lower Extremities:      Previously seen
LVOT:                  Previously seen

Other:  Feet previously visualized. Technically difficult due to fetal position
and patient motion.
Cervix Uterus Adnexa

Cervix
Not visualized (advanced GA >44wks)

Uterus
No abnormality visualized.

Left Ovary
Not visualized.

Right Ovary
Not visualized.
Adnexa
No abnormality visualized.
Comments

U/S images reviewed. Findings reviewed with patient.
Appropriate fetal growth is noted.  No evidence of fetal
compromise is found on BPP today.  No fetal abnormalities
are seen.
AFI is between the 3rd and 5th percentiles for this gestational
age.  Patient was encouraged to drink a minimum of 8 x 8 oz
glasses of water per day.
Questions answered.
10 minutes spent face to face with patient.
Recommendations: 1) Serial U/S every 4 weeks for fetal
growth 2) Weekly BPP beginning in 1 week 3) Increased
hydration
Recommendations

1) Serial U/S every 4 weeks for fetal growth 2) Weekly BPP
beginning in 1 week 3) Increased hydration

## 2020-01-12 ENCOUNTER — Encounter: Payer: Self-pay | Admitting: *Deleted

## 2020-01-13 ENCOUNTER — Ambulatory Visit: Payer: Medicaid Other | Admitting: Diagnostic Neuroimaging

## 2020-01-13 ENCOUNTER — Encounter: Payer: Self-pay | Admitting: Diagnostic Neuroimaging

## 2020-01-13 ENCOUNTER — Telehealth: Payer: Self-pay | Admitting: Diagnostic Neuroimaging

## 2020-01-13 ENCOUNTER — Other Ambulatory Visit: Payer: Self-pay

## 2020-01-13 VITALS — BP 113/73 | HR 73 | Ht 62.0 in | Wt 167.2 lb

## 2020-01-13 DIAGNOSIS — R5383 Other fatigue: Secondary | ICD-10-CM

## 2020-01-13 DIAGNOSIS — R531 Weakness: Secondary | ICD-10-CM

## 2020-01-13 DIAGNOSIS — R200 Anesthesia of skin: Secondary | ICD-10-CM

## 2020-01-13 DIAGNOSIS — R202 Paresthesia of skin: Secondary | ICD-10-CM | POA: Diagnosis not present

## 2020-01-13 IMAGING — US US MFM FETAL BPP W/O NON-STRESS
1 series · 15 of 23 positions shown · non-contrast
Comparison: none

[Series 1: us mfm fetal bpp w/o non-stress · 23 acquisitions, 15 frames shown]
[im 1/23]
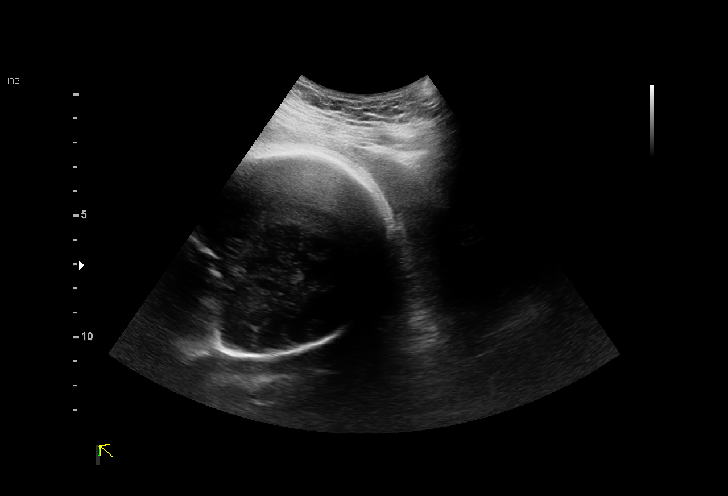
[im 3/23]
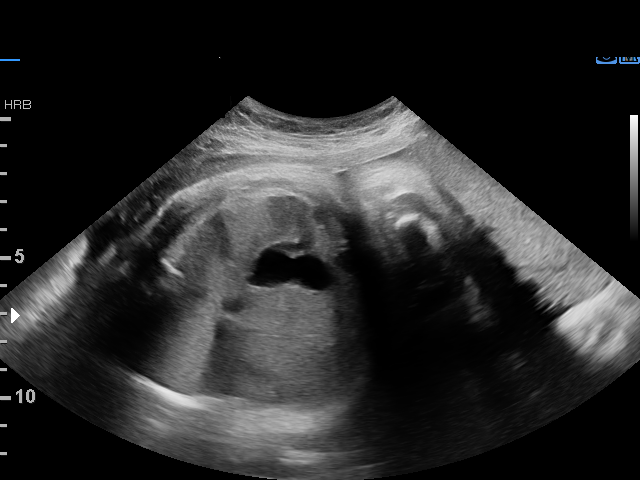
[im 4/23]
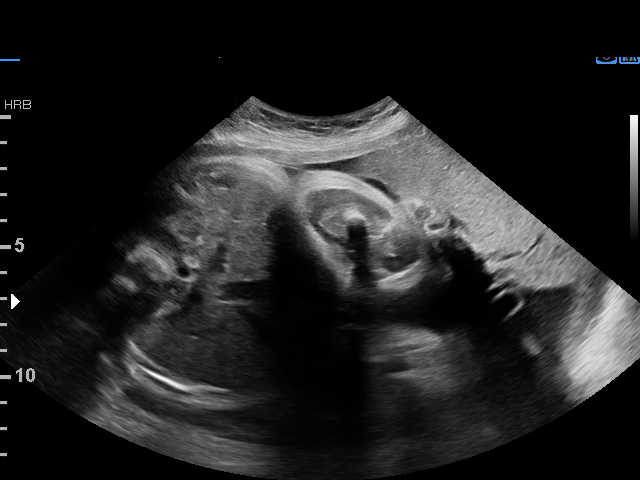
[im 6/23]
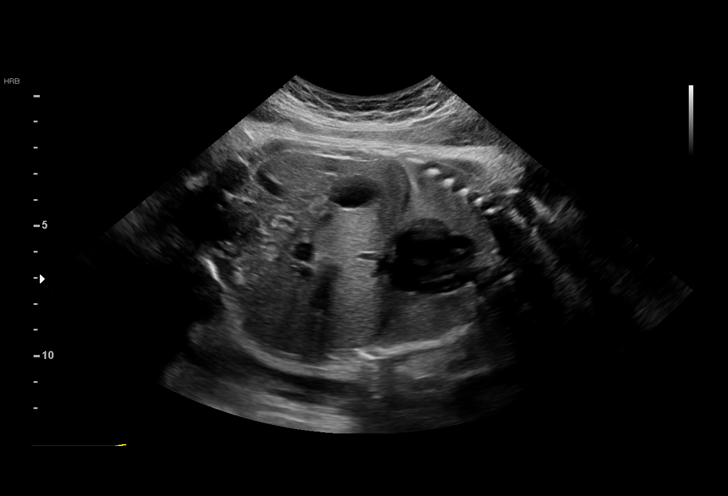
[im 7/23]
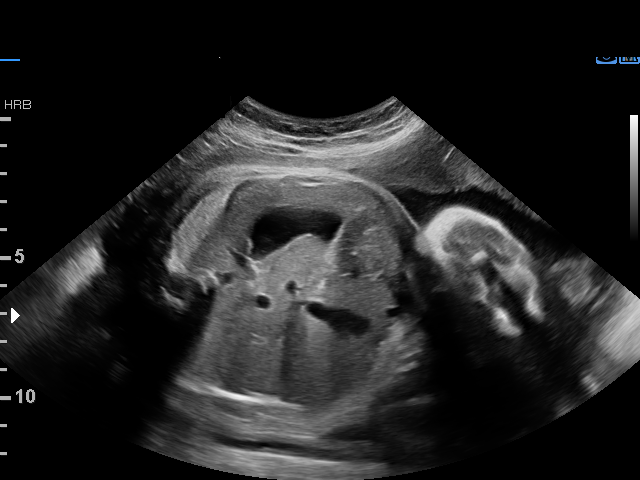
[im 9/23]
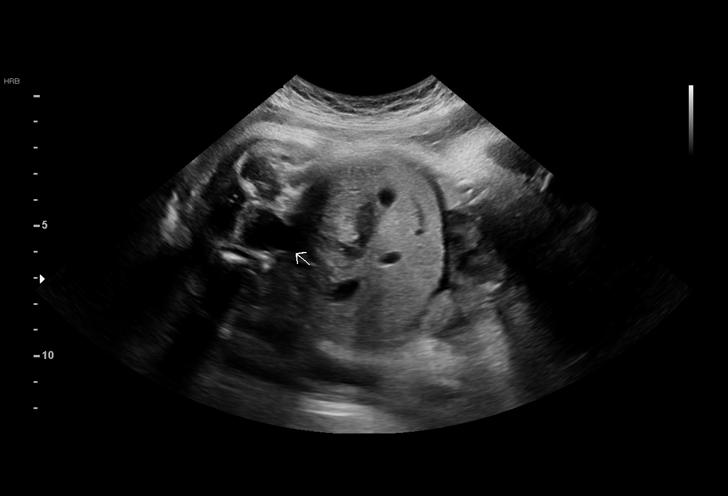
[im 10/23]
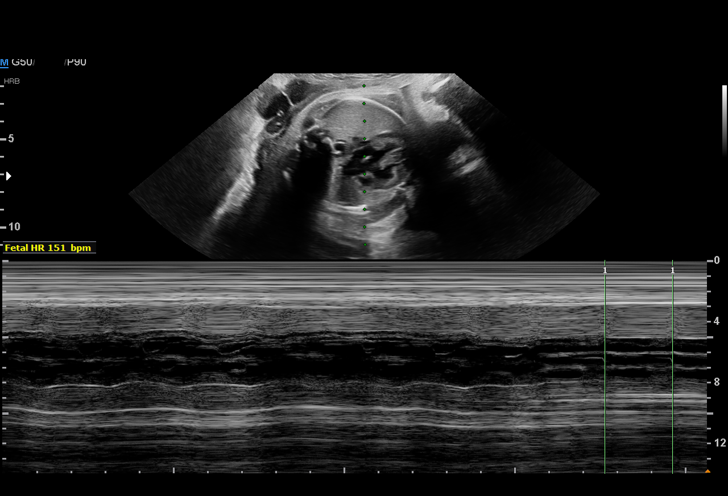
[im 12/23]
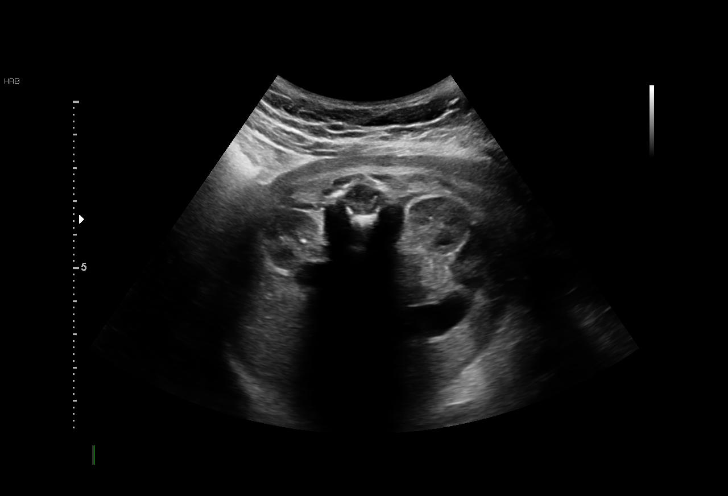
[im 14/23]
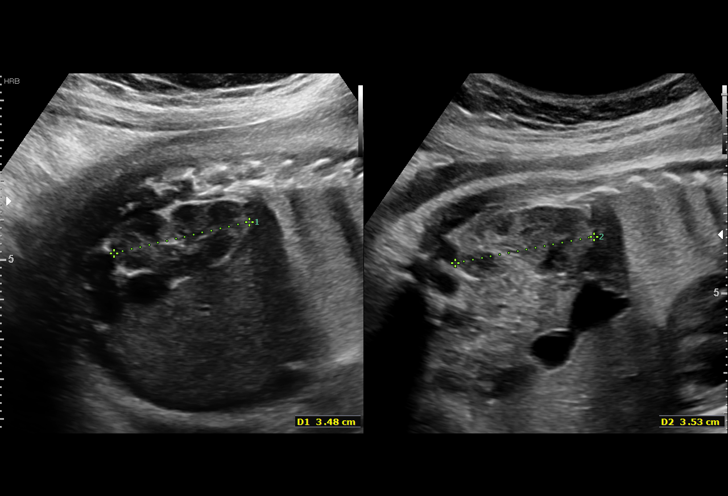
[im 15/23]
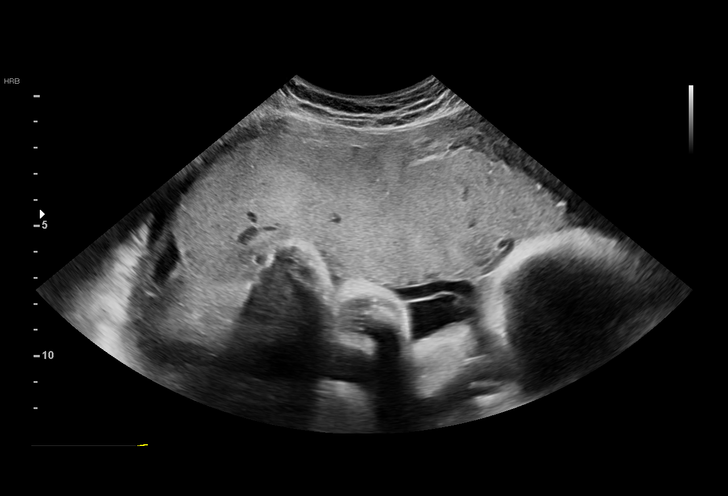
[im 17/23]
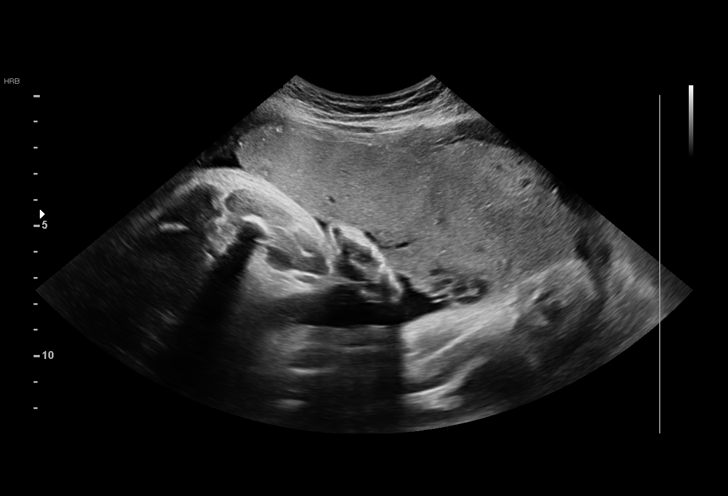
[im 18/23]
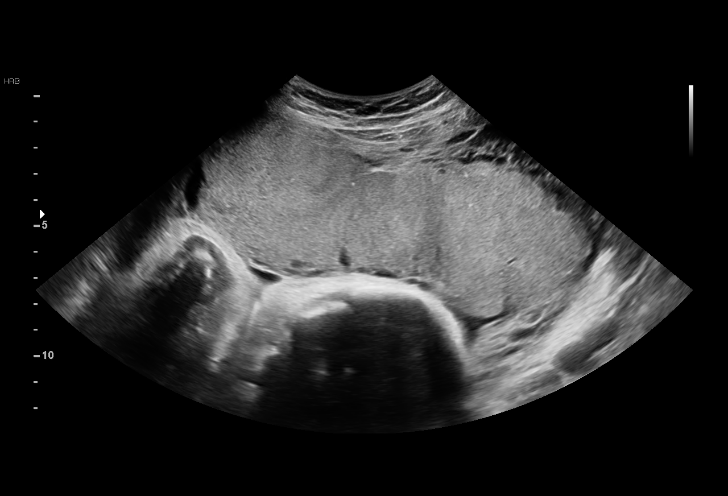
[im 20/23]
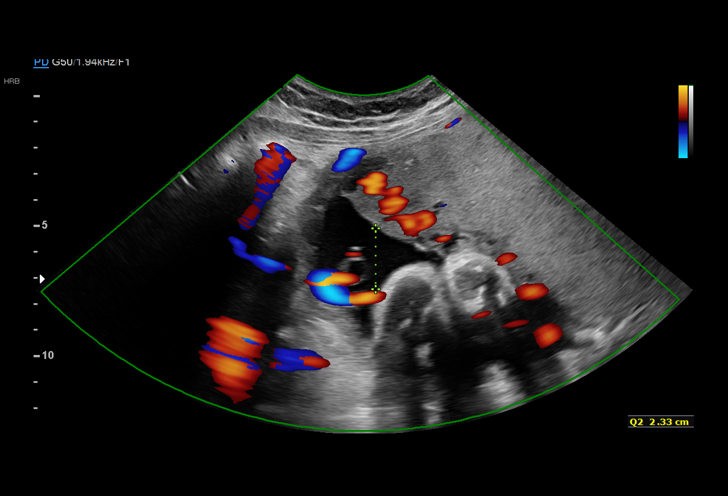
[im 21/23]
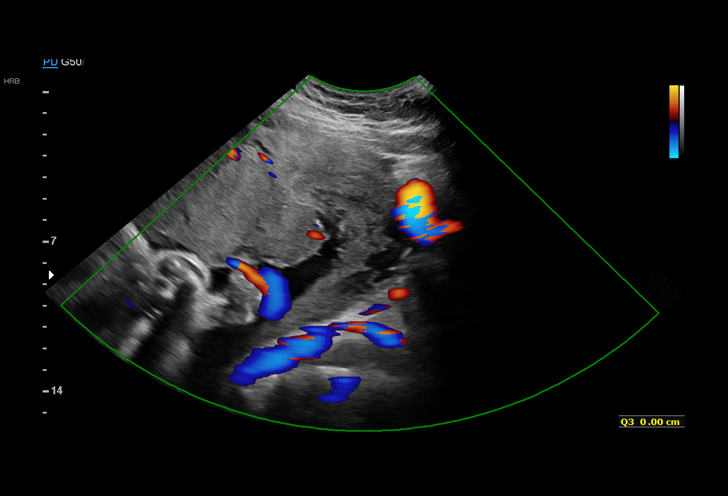
[im 23/23]
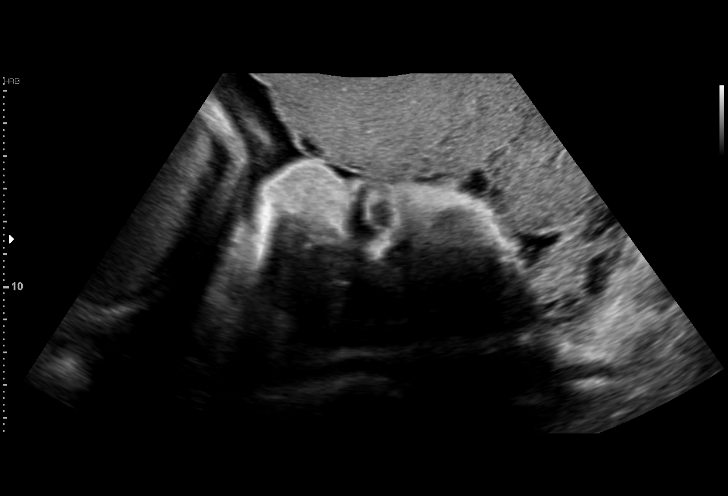

[15 of 23 positions shown; findings below may reference images not displayed]

Indications

Previous cesarean delivery, antepartum x4
Poor obstetric history: Previous IUFD
(stillbirth) (Twin A-at 37 weeks)
Advanced maternal age multigravida 35+,
third trimester (low risk NIPS)
Gestational diabetes in pregnancy, diet
controlled
Short interval between pregancies, 3rd
trimester
32 weeks gestation of pregnancy
Vital Signs

BMI:
Fetal Evaluation

Num Of Fetuses:         1
Fetal Heart Rate(bpm):  151
Cardiac Activity:       Observed
Presentation:           Cephalic
Placenta:               Anterior

Amniotic Fluid
AFI FV:      Subjectively decreased

AFI Sum(cm)     %Tile       Largest Pocket(cm)
5.08            < 3

RUQ(cm)       RLQ(cm)       LUQ(cm)        LLQ(cm)
0             2.75          2.33           0
Biophysical Evaluation

Amniotic F.V:   Within normal limits       F. Tone:        Observed
F. Movement:    Observed                   Score:          [DATE]
F. Breathing:   Observed
OB History

Gravidity:    8         Term:   4        Prem:   0        SAB:   2
TOP:          1       Ectopic:  0        Living: 4
Gestational Age

LMP:           33w 5d        Date:  07/24/17                 EDD:   04/30/18
Best:          32w 5d     Det. By:  U/S  (12/05/17)          EDD:   05/07/18
Anatomy

Thoracic:              Appears normal         Abdomen:                Appears normal
Diaphragm:             Appears normal         Kidneys:                Appear normal
Stomach:               Appears normal, left   Bladder:                Appears normal
sided
Cervix Uterus Adnexa

Cervix
Not visualized (advanced GA >54wks)
Impression

Gestational diabetes: Well-controlled on diet.
Previous 4 cesarean deliveries: No evidence of placenta
accreta.
Previous fetal demise of one twin with normal outcome in
surviving twin.
On ultrasound, amniotic fluid is decreased and the AFI is 5
cm. Two maximum vertical pockets of over 2 cm were seen.
Antenatal testing is reassuring.
Patient reports good fetal movements.
I reassured the patient of the findings. MVP >2 cm has a
good prognosis. Patient does not give history of leakage of
amniotic fluid.

Recommendations

-Follow-up antenatal testing next week.
-Continue weekly antenatal testing till delivery.

## 2020-01-13 NOTE — Progress Notes (Signed)
[facility]  PATIENT: Barbara Todd DOB: 10/03/1980  REFERRING CLINICIAN: Verlon Au, MD HISTORY FROM: patient  REASON FOR VISIT: new consult    HISTORICAL  CHIEF COMPLAINT:  Chief Complaint  Patient presents with  . Paresthesia of skin    rm 7 New Pt "1 yr history of numbness, pain in fingers/feet; occasional pain and heaviness in left leg at night"    HISTORY OF PRESENT ILLNESS:   39 year old female here for evaluation of numbness and tingling. Symptoms started 1 year ago with intermittent numbness and tingling in her bilateral fingers, hands, feet. She has some low back pain and left leg discomfort and heaviness. Symptoms are intermittent and random. No specific triggering or aggravating factors. Patient has been under more stress lately. She has 5 children at home.   REVIEW OF SYSTEMS: Full 14 system review of systems performed and negative with exception of: As per HPI.  ALLERGIES: No Known Allergies  HOME MEDICATIONS: Outpatient Medications Prior to Visit  Medication Sig Dispense Refill  . acetaminophen (TYLENOL) 325 MG tablet Take 650 mg by mouth every 6 (six) hours as needed.    Marland Kitchen ibuprofen (ADVIL,MOTRIN) 600 MG tablet Take 1 tablet (600 mg total) by mouth every 6 (six) hours as needed for mild pain, moderate pain or cramping. (Patient not taking: Reported on 01/13/2020) 30 tablet 0  . Prenat-FeAsp-Meth-FA-DHA w/o A (PRENATE PIXIE) 10-0.6-0.4-200 MG CAPS Take 1 tablet by mouth daily. (Patient not taking: Reported on 01/13/2020) 30 capsule 12   No facility-administered medications prior to visit.    PAST MEDICAL HISTORY: Past Medical History:  Diagnosis Date  . Chronic back pain   . Diabetes mellitus without complication (HCC)   . Gestational diabetes   . Numbness and tingling     PAST SURGICAL HISTORY: Past Surgical History:  Procedure Laterality Date  . CESAREAN SECTION     total of 4  . CESAREAN SECTION MULTI-GESTATIONAL WITH  TUBAL Bilateral 01/23/2017   Procedure: CESAREAN SECTION MULTI-GESTATIONAL;  Surgeon: Tilda Burrow, MD;  Location: [facility] BIRTHING SUITES;  Service: Obstetrics;  Laterality: Bilateral;  . CESAREAN SECTION WITH BILATERAL TUBAL LIGATION N/A 04/21/2018   Procedure: REPEAT CESAREAN SECTION WITH BILATERAL TUBAL LIGATION;  Surgeon: Adam Phenix, MD;  Location: [facility] BIRTHING SUITES;  Service: Obstetrics;  Laterality: N/A;  . DILATION AND EVACUATION N/A 08/30/2014   Procedure: DILATATION AND EVACUATION;  Surgeon: Catalina Antigua, MD;  Location: [facility] ORS;  Service: Gynecology;  Laterality: N/A;    FAMILY HISTORY: Family History  Problem Relation Age of Onset  . Hypertension Mother     SOCIAL HISTORY: Social History   Socioeconomic History  . Marital status: Married    Spouse name: Sheku  . Number of children: 5  . Years of education: Not on file  . 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 Smoking status: Never Smoker  . Smokeless tobacco: Never Used  Vaping Use  . Vaping Use: Never used  Substance and Sexual Activity  . Alcohol use: No  . Drug use: No  . Sexual activity: Yes    Partners: Male    Birth control/protection: None  Other Topics Concern  . Not on file  Social History Narrative   Lives with family   No caffeine   Social Determinants of Health   Financial Resource Strain:   . Difficulty of Paying Living Expenses: Not on file  Food Insecurity:   . Worried About Programme researcher, broadcasting/film/video in the Last Year: Not on file  . Ran Out of Food in the Last Year: Not on file  Transportation Needs:   . Lack of Transportation (Medical): Not on file  . Lack of Transportation (Non-Medical): Not on file  Physical Activity:   . Days of Exercise per Week: Not on file  . Minutes of Exercise per Session: Not on file  Stress:   . Feeling of Stress : Not on file  Social Connections:   . Frequency of Communication with  Friends and Family: Not on file  . Frequency of Social Gatherings with Friends and Family: Not on file  . Attends Religious Services: Not on file  . Active Member of Clubs or Organizations: Not on file  . Attends Banker Meetings: Not on file  . Marital Status: Not on file  Intimate Partner Violence:   . Fear of Current or Ex-Partner: Not on file  . Emotionally Abused: Not on file  . Physically Abused: Not on file  . Sexually Abused: Not on file     PHYSICAL EXAM  GENERAL EXAM/CONSTITUTIONAL: Vitals:  Vitals:   01/13/20 0814  BP: 113/73  Pulse: 73  Weight: 167 lb 3.2 oz (75.8 kg)  Height: 5\' 2"  (1.575 m)     Body mass index is 30.58 kg/m. Wt Readings from Last 3 Encounters:  01/13/20 167 lb 3.2 oz (75.8 kg)  06/03/18 166 lb 4.8 oz (75.4 kg)  05/21/18 167 lb 3.2 oz (75.8 kg)     Patient is in no distress; well developed, nourished and groomed; neck is supple  CARDIOVASCULAR:  Examination of carotid arteries is normal; no carotid bruits  Regular rate and rhythm, no murmurs  Examination of peripheral vascular system by observation and palpation is normal  EYES:  Ophthalmoscopic exam of optic discs and posterior segments is normal; no papilledema or hemorrhages  No exam data present  MUSCULOSKELETAL:  Gait, strength, tone, movements noted in Neurologic exam below  NEUROLOGIC: MENTAL STATUS:  No flowsheet data found.  awake, alert, oriented to person, place and time  recent and remote memory intact  normal attention and concentration  language fluent, comprehension intact, naming intact  fund of knowledge appropriate  CRANIAL NERVE:   2nd - no papilledema on fundoscopic exam  2nd, 3rd, 4th, 6th - pupils equal and reactive to light, visual fields full to confrontation, extraocular muscles intact, no nystagmus  5th - facial sensation symmetric  7th - facial strength symmetric  8th - hearing intact  9th - palate elevates  symmetrically, uvula midline  11th - shoulder shrug symmetric  12th - tongue protrusion midline  MOTOR:   normal bulk and tone, full strength in the BUE, BLE  SENSORY:   normal and symmetric to light touch, pinprick, temperature, vibration  COORDINATION:   finger-nose-finger, fine finger movements normal  REFLEXES:   deep tendon reflexes present and symmetric  GAIT/STATION:   narrow based gait     DIAGNOSTIC DATA (LABS, IMAGING, TESTING) - I reviewed patient records, labs, notes, testing and imaging myself where available.  Lab Results  Component Value Date   WBC 14.4 (H) 04/22/2018   HGB 11.4 (L) 04/22/2018   HCT 34.5 (L) 04/22/2018   MCV 95.0 04/22/2018   PLT 233 04/22/2018      Component Value Date/Time   NA 136 01/22/2017 1205   NA 140 03/12/2016 0912   K 3.8 01/22/2017 1205  CL 103 01/22/2017 1205   CO2 23 01/22/2017 1205   GLUCOSE 69 01/22/2017 1205   BUN <5 (L) 01/22/2017 1205   BUN 12 03/12/2016 0912   CREATININE 0.64 01/22/2017 1205   CALCIUM 9.1 01/22/2017 1205   PROT 8.0 01/22/2017 1205   PROT 7.5 03/12/2016 0912   ALBUMIN 2.8 (L) 01/22/2017 1205   ALBUMIN 4.2 03/12/2016 0912   AST 23 01/22/2017 1205   ALT 24 01/22/2017 1205   ALKPHOS 295 (H) 01/22/2017 1205   BILITOT 0.5 01/22/2017 1205   BILITOT 0.3 03/12/2016 0912   GFRNONAA >60 01/22/2017 1205   GFRAA >60 01/22/2017 1205   Lab Results  Component Value Date   CHOL 188 03/12/2016   HDL 60 03/12/2016   LDLCALC 114 (H) 03/12/2016   TRIG 69 03/12/2016   CHOLHDL 3.1 03/12/2016   Lab Results  Component Value Date   HGBA1C 5.1 11/18/2017   No results found for: [id number] Lab Results  Component Value Date   TSH 1.130 10/09/2016    Vit B12 723  Vit D 14     ASSESSMENT AND PLAN  39 y.o. year old female here with intermittent numbness and tingling, fatigue, weakness in hands and feet. We will proceed with further work-up to rule out demyelinating disease.  Dx:  1.  Numbness and tingling   2. Other fatigue   3. Weakness      PLAN:  INTERMITTENT NUMBNESS / TINGLING / FATIGUE / WEAKNESS - check MRI brain (rule out demyelinating dz)  Orders Placed This Encounter  Procedures  . MR BRAIN W WO CONTRAST   Return pending testing, for pending if symptoms worsen or fail to improve.    Suanne Marker, MD 01/13/2020, 9:00 AM Certified in Neurology, Neurophysiology and Neuroimaging  [facility] [address], [address] [place], [place] [address] [phone]

## 2020-01-13 NOTE — Telephone Encounter (Signed)
[facility] order sent to GI. No auth until 9/27. GI will reach out to the patient to schedule.

## 2020-01-20 IMAGING — US US MFM FETAL BPP W/O NON-STRESS
1 series · 15 of 27 positions shown · non-contrast
Comparison: none

[Series 1: us mfm fetal bpp w/o non-stress · 27 acquisitions, 15 frames shown]
[im 1/27]
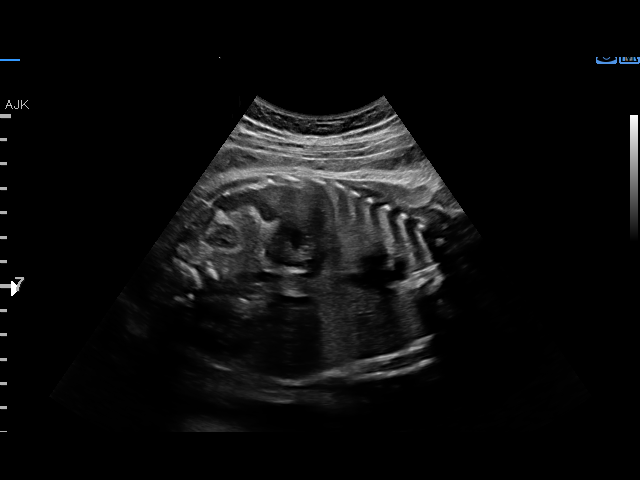
[im 3/27]
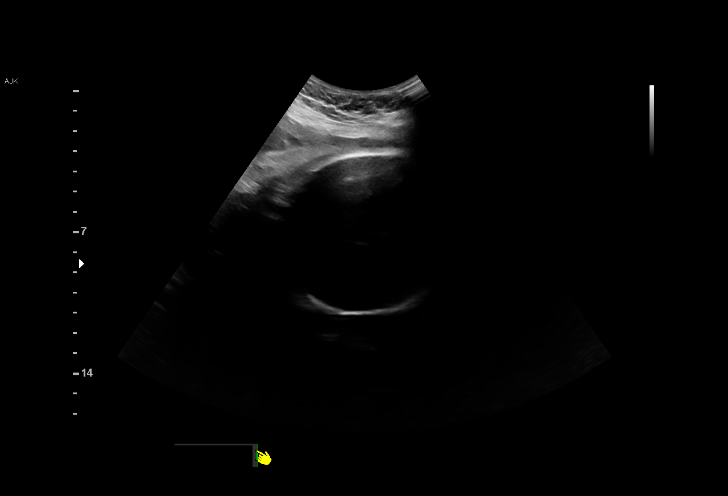
[im 5/27]
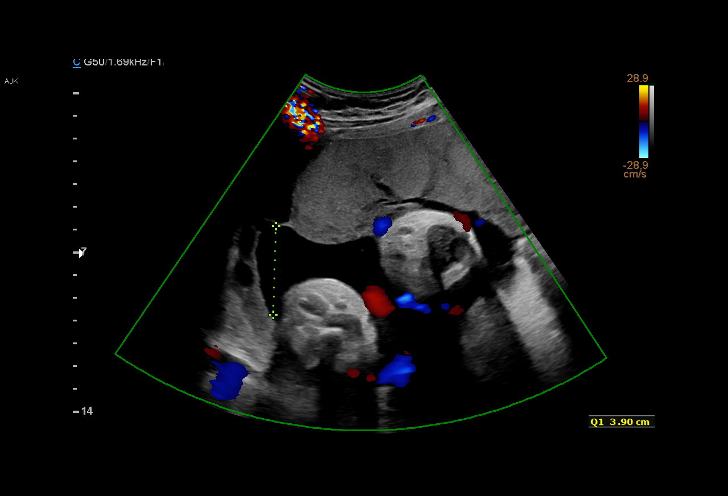
[im 7/27]
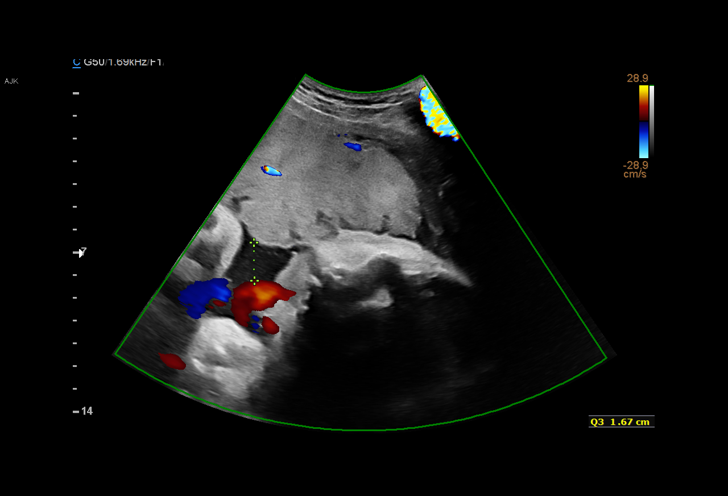
[im 9/27]
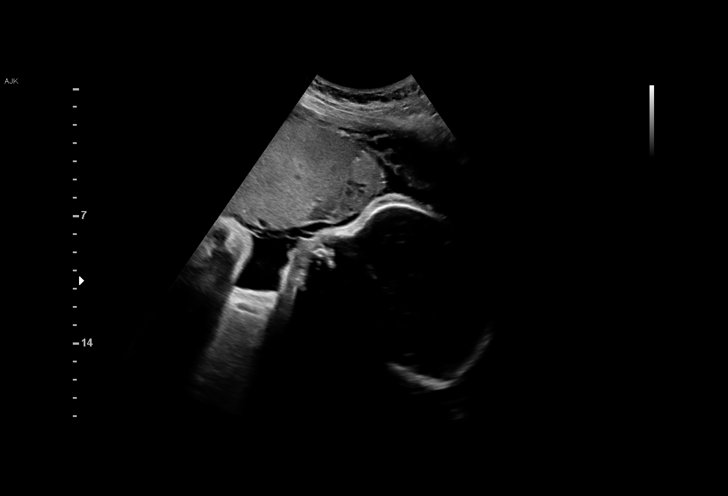
[im 10/27]
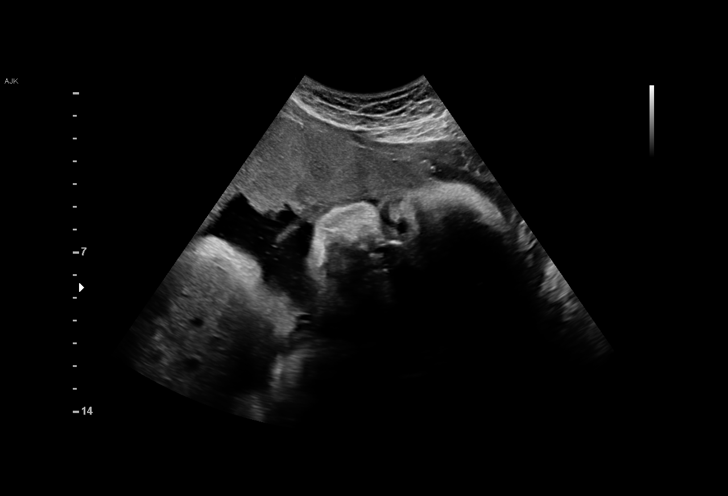
[im 12/27]
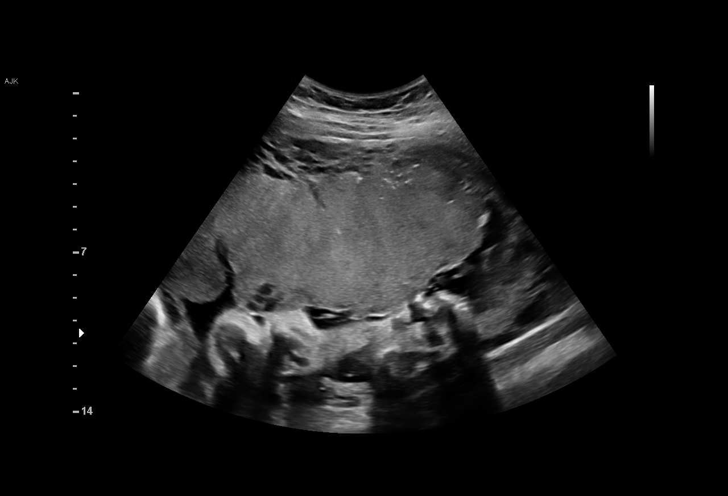
[im 14/27]
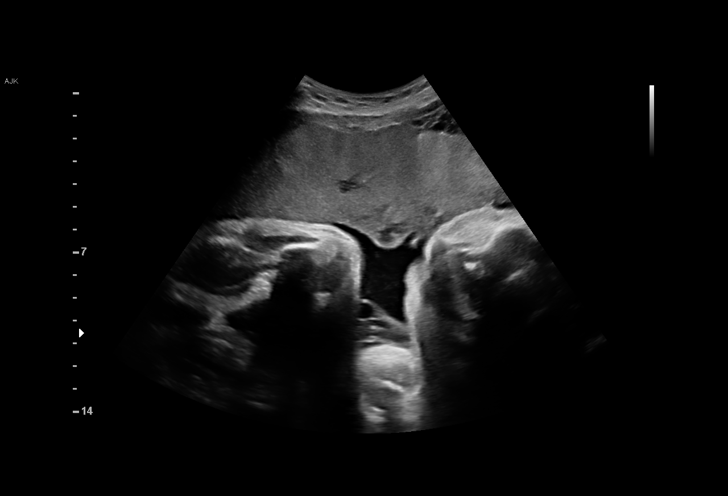
[im 16/27]
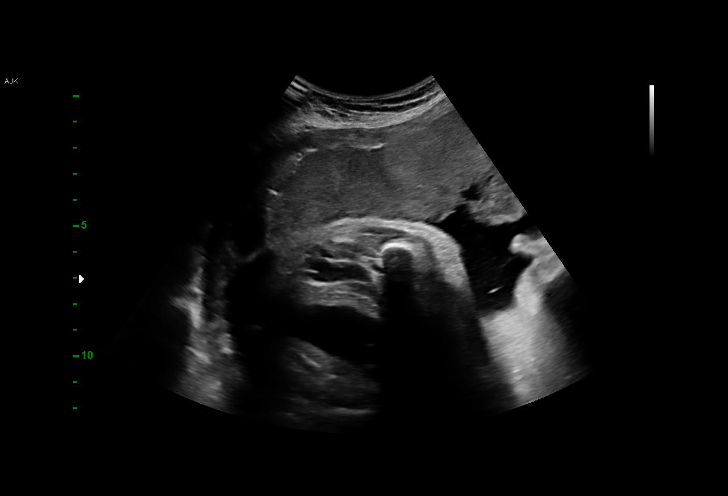
[im 18/27]
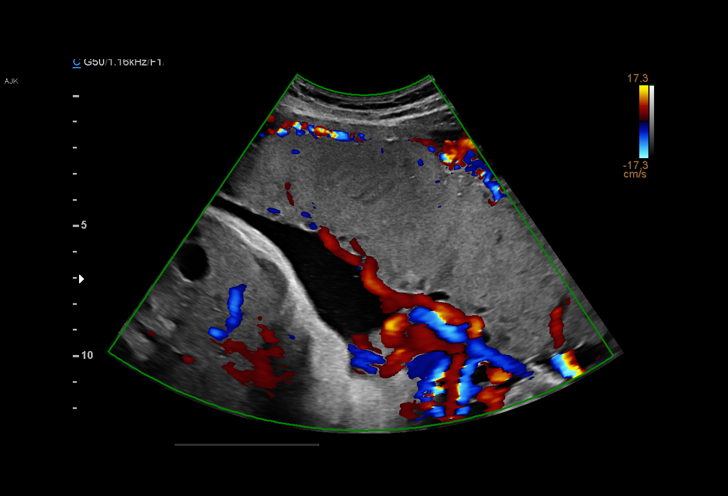
[im 19/27]
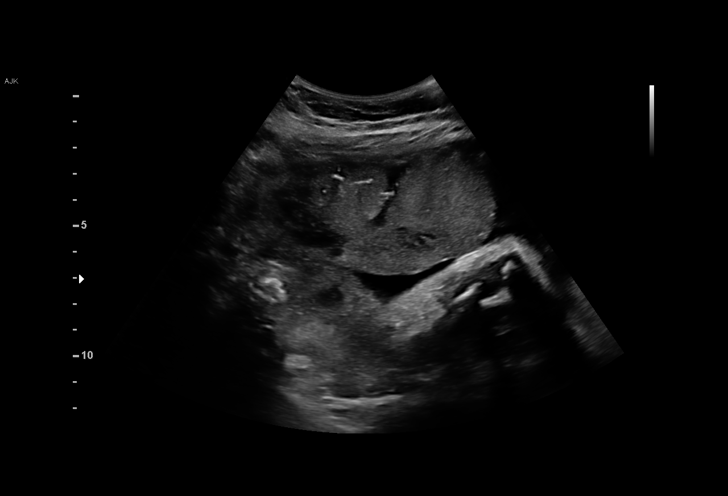
[im 21/27]
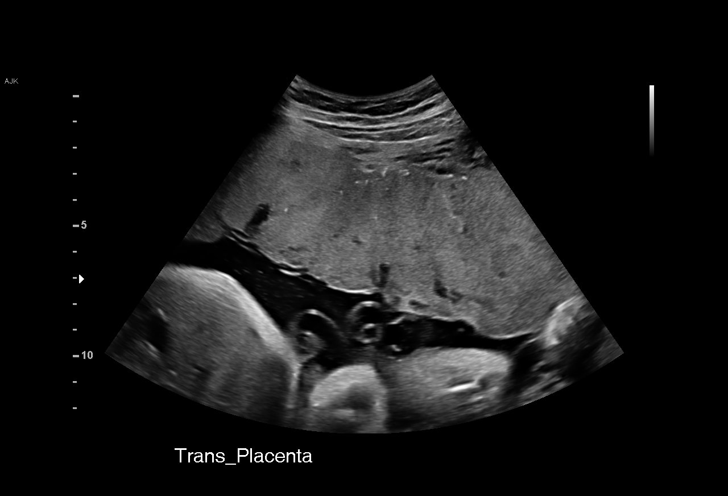
[im 23/27]
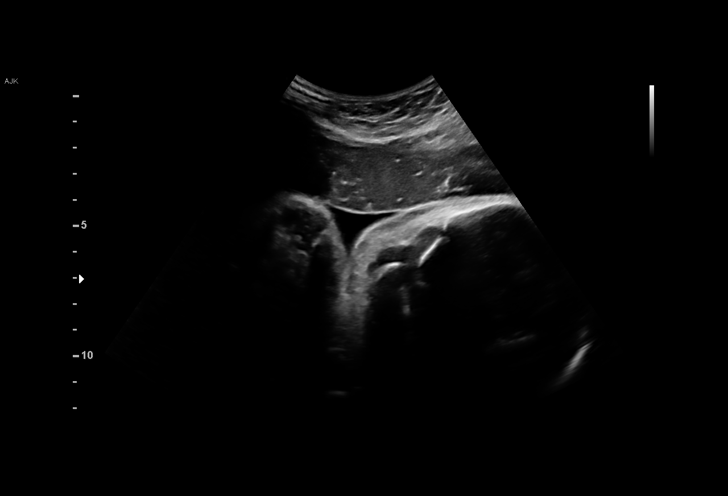
[im 25/27]
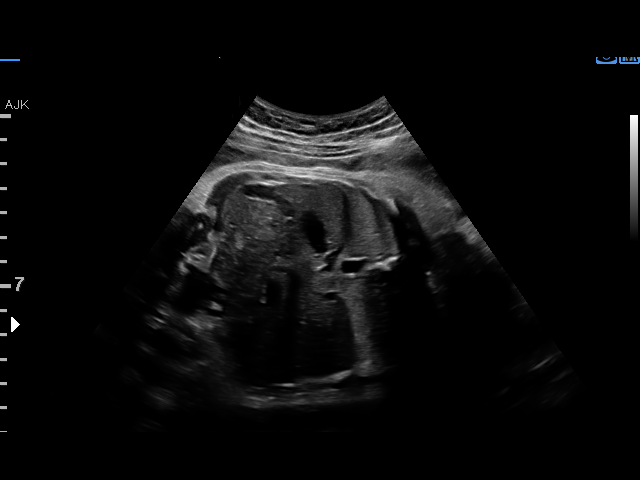
[im 27/27]
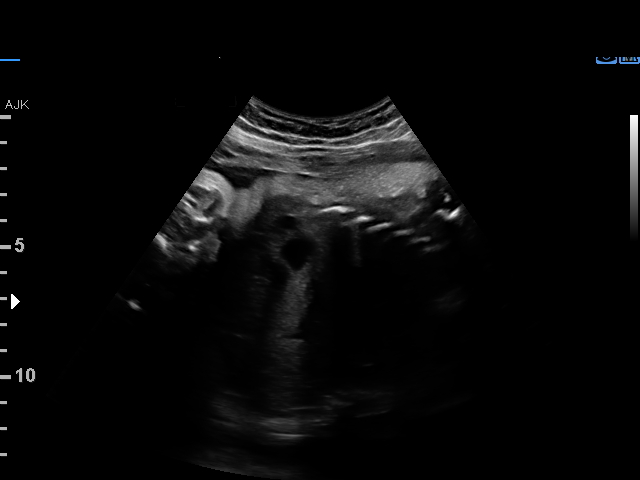

[15 of 27 positions shown; findings below may reference images not displayed]

----------------------------------------------------------------------

 ----------------------------------------------------------------------
Indications

  Previous cesarean delivery, antepartum x4
  Poor obstetric history: Previous IUFD
  (stillbirth) (Twin A-at 37 weeks)
  Advanced maternal age multigravida 35+,
  third trimester (low risk NIPS)
  Gestational diabetes in pregnancy, diet
  controlled
  Short interval between pregancies, 3rd
  trimester
  33 weeks gestation of pregnancy
 ----------------------------------------------------------------------
Vital Signs

                                                Height:        5'2"
Fetal Evaluation

 Num Of Fetuses:          1
 Fetal Heart Rate(bpm):   149
 Cardiac Activity:        Observed
 Presentation:            Cephalic
 Placenta:                Anterior
 P. Cord Insertion:       Visualized

 Amniotic Fluid
 AFI FV:      Subjectively low-normal

 AFI Sum(cm)     %Tile       Largest Pocket(cm)
 8.09            5
 RUQ(cm)                     LUQ(cm)        LLQ(cm)


 Comment:    Stomach, diaphragm, bladder noted.
Biophysical Evaluation

 Amniotic F.V:   Pocket => 2 cm two         F. Tone:         Observed
                 planes
 F. Movement:    Observed                   Score:           [DATE]
 F. Breathing:   Observed
OB History

 Gravidity:    8         Term:   4        Prem:   0        SAB:   2
 TOP:          1       Ectopic:  0        Living: 4
Gestational Age

 LMP:           34w 5d        Date:  07/24/17                 EDD:   04/30/18
 Best:          33w 5d     Det. By:  U/S  (12/05/17)          EDD:   05/07/18
Impression

 Biophysical profile [DATE]
Recommendations

 Follow up growth and BPP scheduled in 1 week.

## 2020-01-27 IMAGING — US US MFM FETAL BPP W/O NON-STRESS
1 series · 14 of 28 positions shown · non-contrast
Comparison: none

[Series 1: us mfm fetal bpp w/o non-stress · 36 acquisitions, 14 frames shown]
[im 2/36]
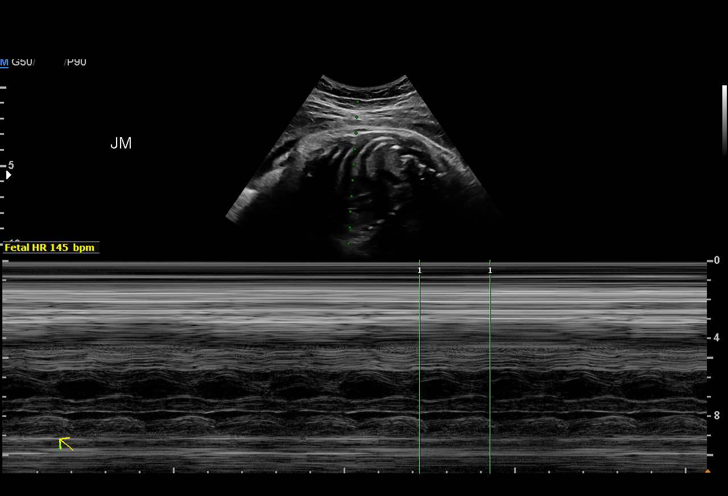
[im 4/36]
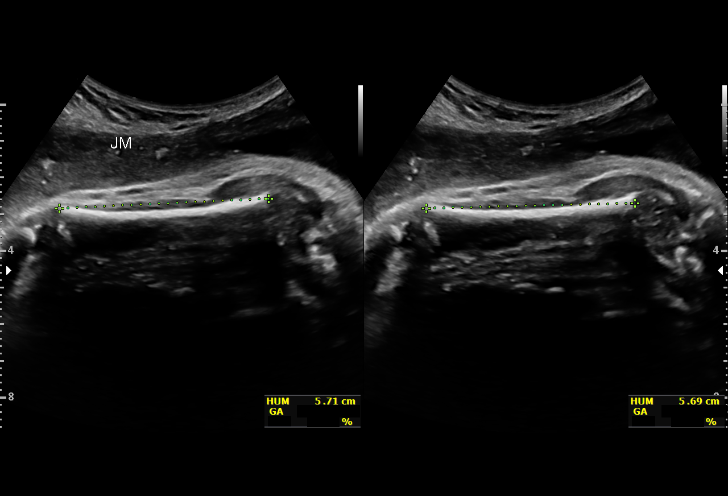
[im 7/36]
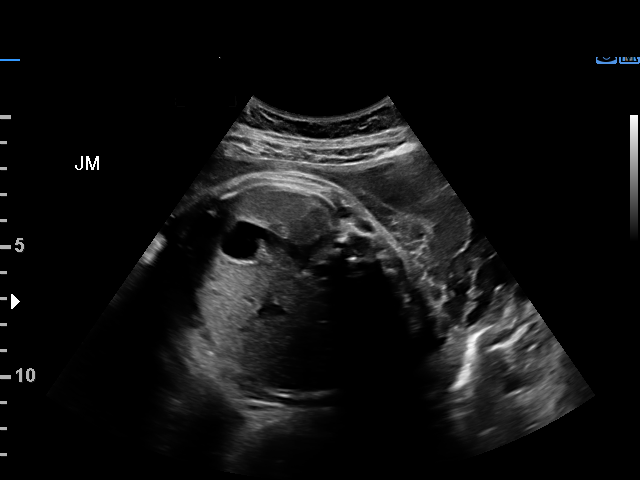
[im 10/36]
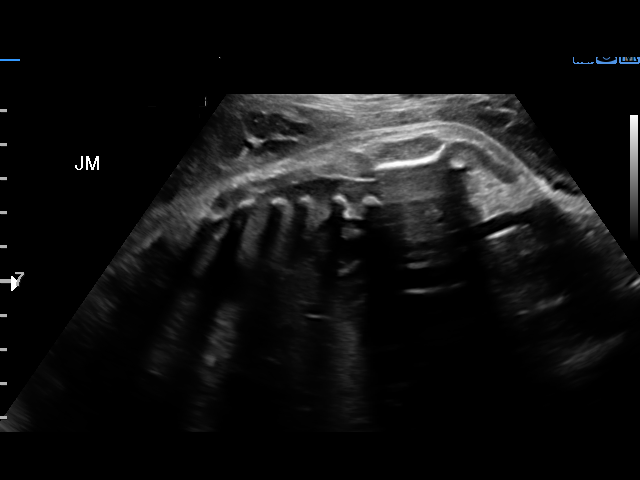
[im 12/36]
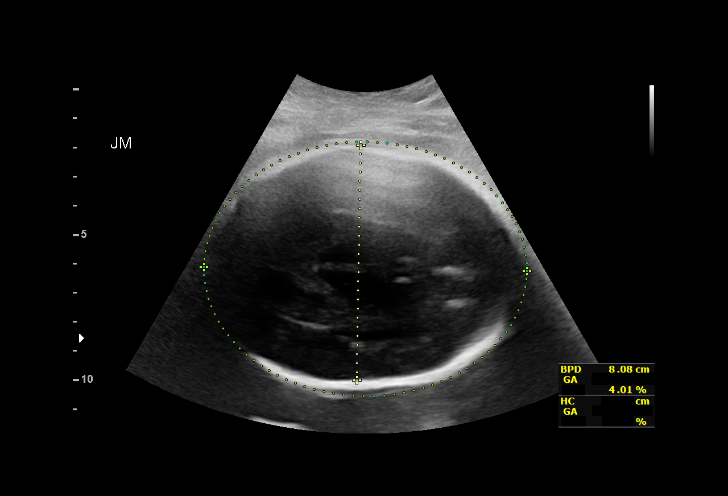
[im 15/36]
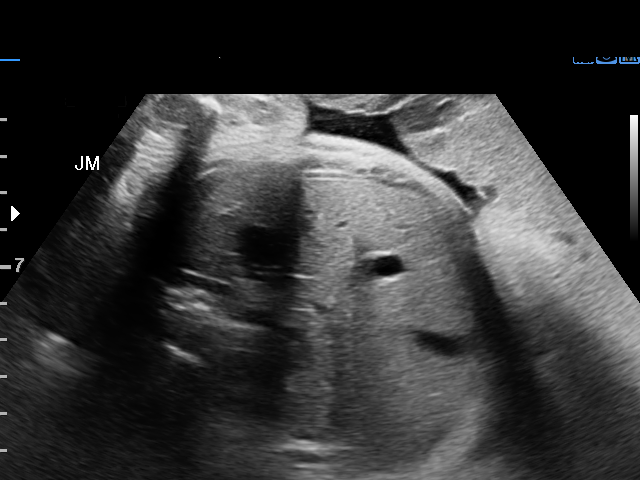
[im 17/36]
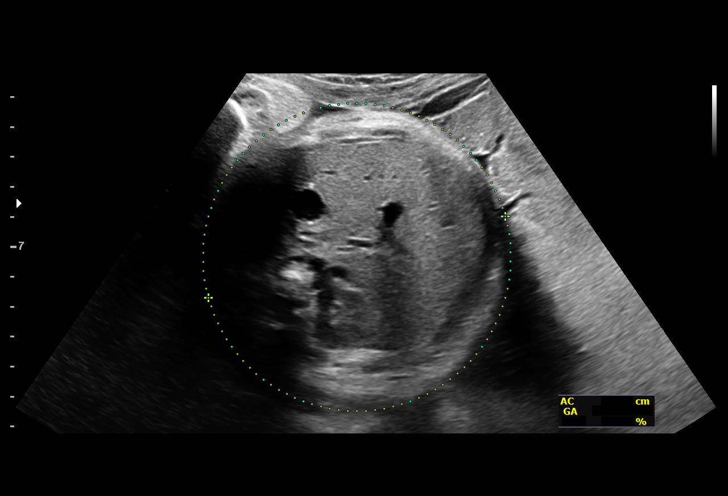
[im 20/36]
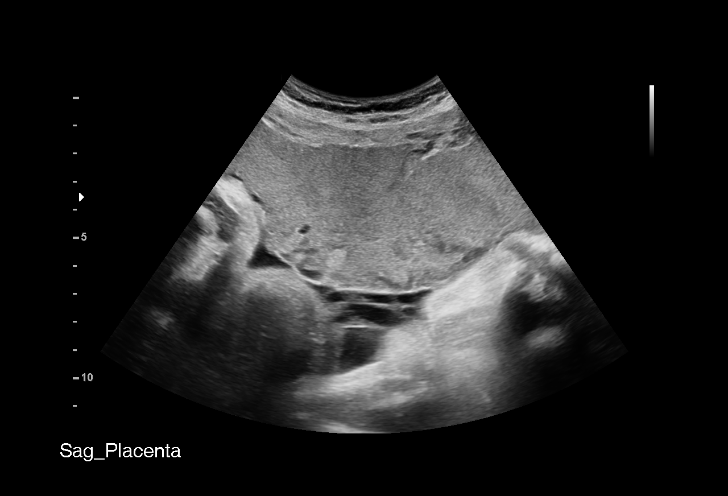
[im 23/36]
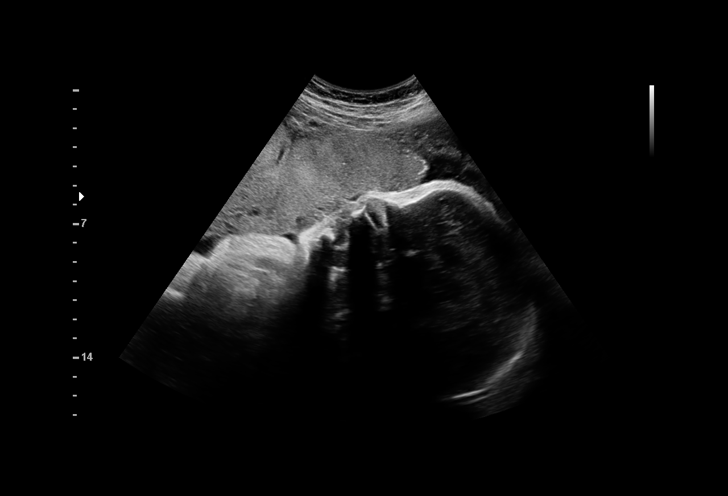
[im 25/36]
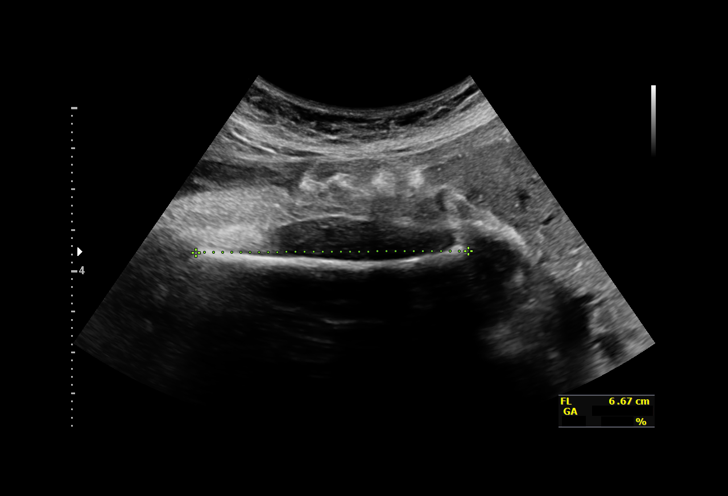
[im 28/36]
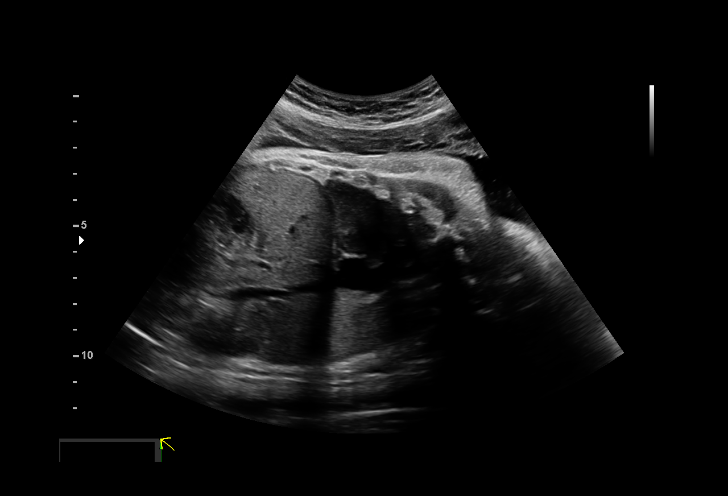
[im 30/36]
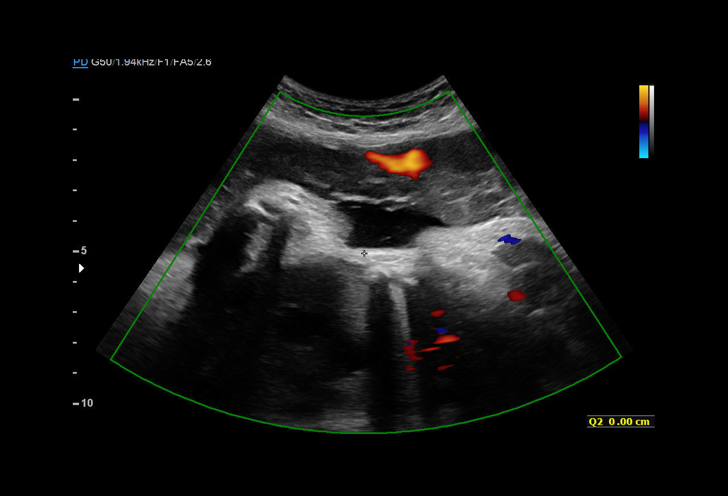
[im 33/36]
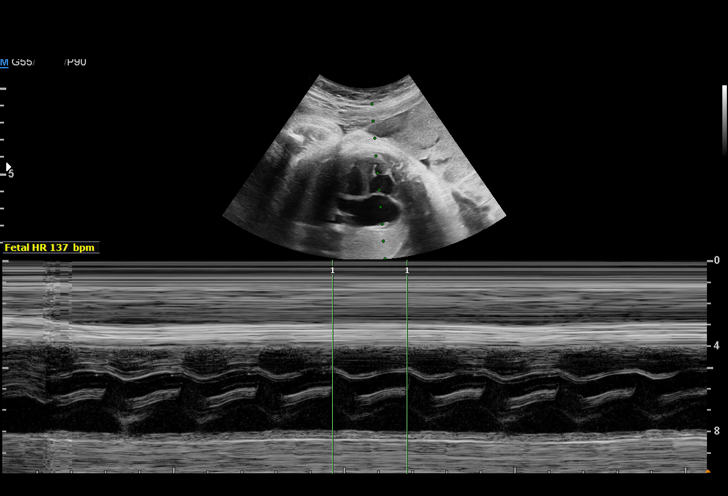
[im 36/36]
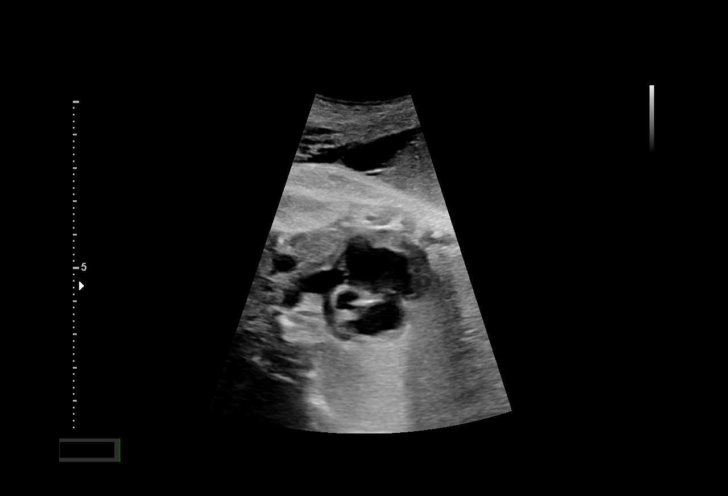

[14 of 28 positions shown; findings below may reference images not displayed]

----------------------------------------------------------------------

 ----------------------------------------------------------------------
Indications

  Previous cesarean delivery, antepartum x4
  Poor obstetric history: Previous IUFD
  (stillbirth) (Twin A-at 37 weeks)
  Advanced maternal age multigravida 35+,
  third trimester (low risk NIPS)
  Gestational diabetes in pregnancy, diet
  controlled
  Short interval between pregancies, 3rd
  trimester
  34 weeks gestation of pregnancy
 ----------------------------------------------------------------------
Vital Signs

                                                Height:        5'2"
Fetal Evaluation

 Num Of Fetuses:          1
 Fetal Heart Rate(bpm):   145
 Cardiac Activity:        Observed
 Presentation:            Cephalic
 Placenta:                Anterior
 P. Cord Insertion:       Previously Visualized

 Amniotic Fluid
 AFI FV:      Within normal limits

 AFI Sum(cm)     %Tile       Largest Pocket(cm)
 8.64            10
 RUQ(cm)       RLQ(cm)       LUQ(cm)        LLQ(cm)
 3.06          2.24          0
Biophysical Evaluation

 Amniotic F.V:   Within normal limits       F. Tone:         Observed
 F. Movement:    Observed                   Score:           [DATE]
 F. Breathing:   Observed
Biometry

 BPD:      81.2  mm     G. Age:  32w 4d          5  %    CI:        68.83   %    70 - 86
                                                         FL/HC:       21.7  %    20.1 -
 HC:      312.7  mm     G. Age:  35w 0d         23  %    HC/AC:       0.96       0.93 -
 AC:       325   mm     G. Age:  36w 3d         92  %    FL/BPD:      83.6  %    71 - 87
 FL:       67.9  mm     G. Age:  34w 6d         47  %    FL/AC:       20.9  %    20 - 24
 HUM:        58  mm     G. Age:  33w 4d         42  %

 Est. FW:    0966   gm   5 lb 15 oz      76  %
OB History

 Gravidity:    8         Term:   4        Prem:   0        SAB:   2
 TOP:          1       Ectopic:  0        Living: 4
Gestational Age

 LMP:           35w 5d        Date:  07/24/17                 EDD:   04/30/18
 U/S Today:     34w 5d                                        EDD:   05/07/18
 Best:          34w 5d     Det. By:  U/S  (12/05/17)          EDD:   05/07/18
Anatomy

 Cranium:               Appears normal         Aortic Arch:            Previously seen
 Cavum:                 Appears normal         Ductal Arch:            Previously seen
 Ventricles:            Previously seen        Diaphragm:              Appears normal
 Choroid Plexus:        Previously seen        Stomach:                Appears normal, left
                                                                       sided
 Cerebellum:            Previously seen        Abdomen:                Appears normal
 Posterior Fossa:       Previously seen        Abdominal Wall:         Previously seen
 Nuchal Fold:           Previously seen        Cord Vessels:           Previously seen
 Face:                  Orbits and profile     Kidneys:                Appear normal
                        previously seen
 Lips:                  Previously seen        Bladder:                Appears normal
 Thoracic:              Appears normal         Spine:                  Previously seen
 Heart:                 Appears normal         Upper Extremities:      Previously seen
                        (4CH, axis, and situs
 RVOT:                  Appears normal         Lower Extremities:      Previously seen
 LVOT:                  Appears normal

 Other:  Feet previously visualized.
Cervix Uterus Adnexa

 Cervix
 Not visualized (advanced GA >66wks)
Impression

 Normal interval growth.
 Biophysical profile [DATE]
Recommendations

 Continue weekly testing and serial growth in 4 weeks.

## 2020-02-03 IMAGING — US US MFM FETAL BPP W/ NON-STRESS
1 series · 12 of 16 positions shown · non-contrast
Comparison: none

[Series 1: us mfm fetal bpp w/ non-stress · 16 acquisitions, 12 frames shown]
[im 1/16]
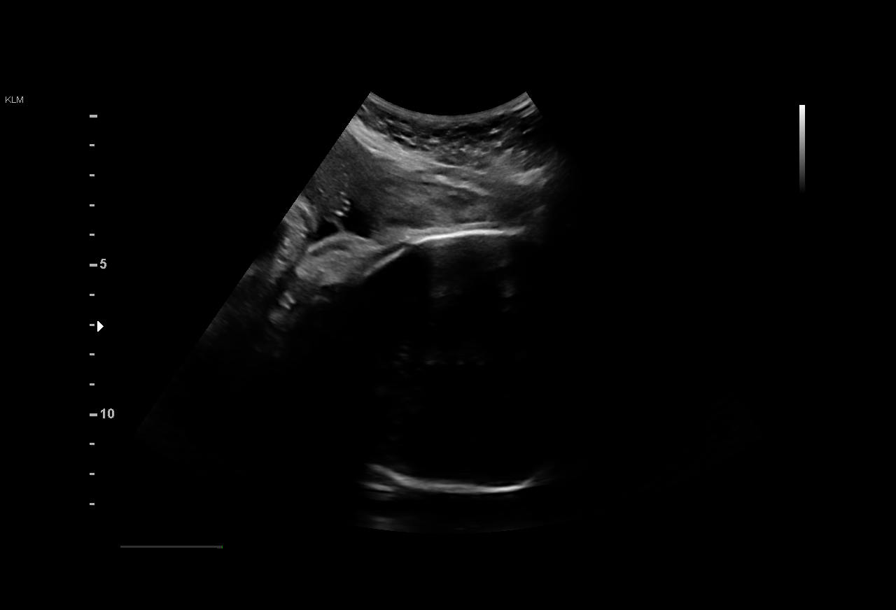
[im 3/16]
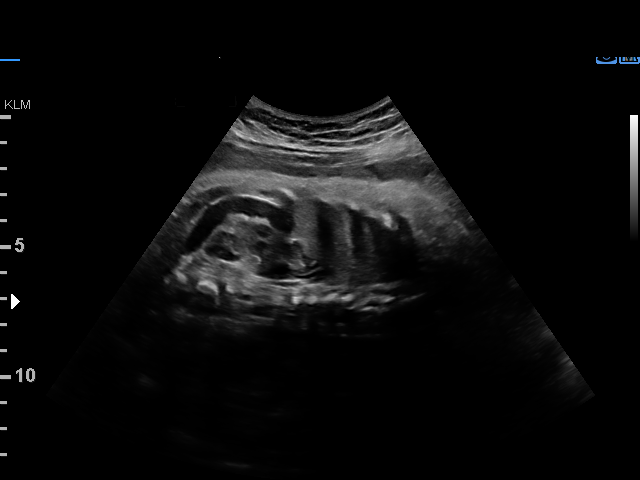
[im 4/16]
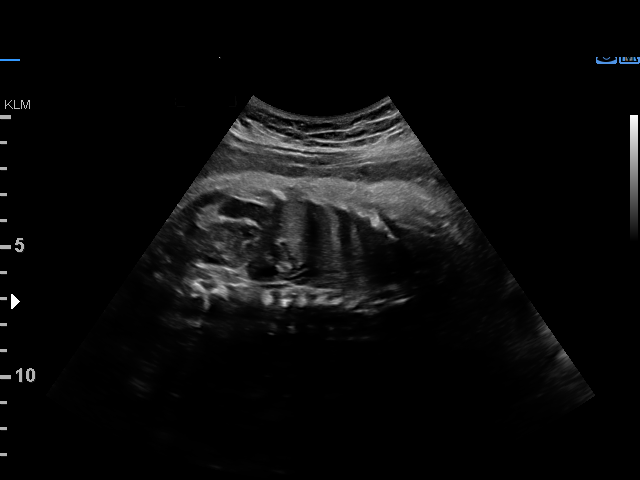
[im 5/16]
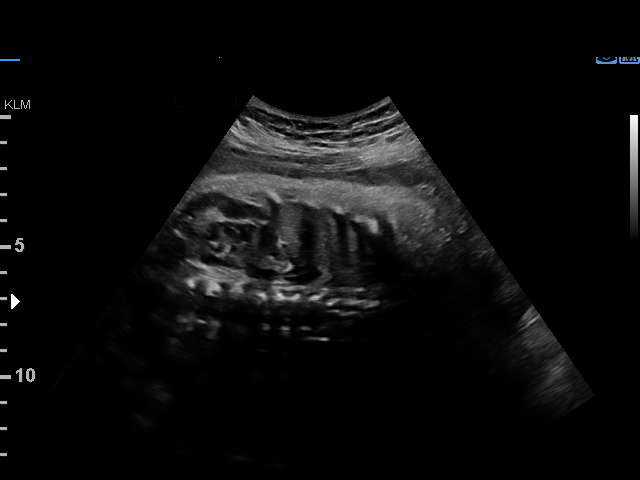
[im 7/16]
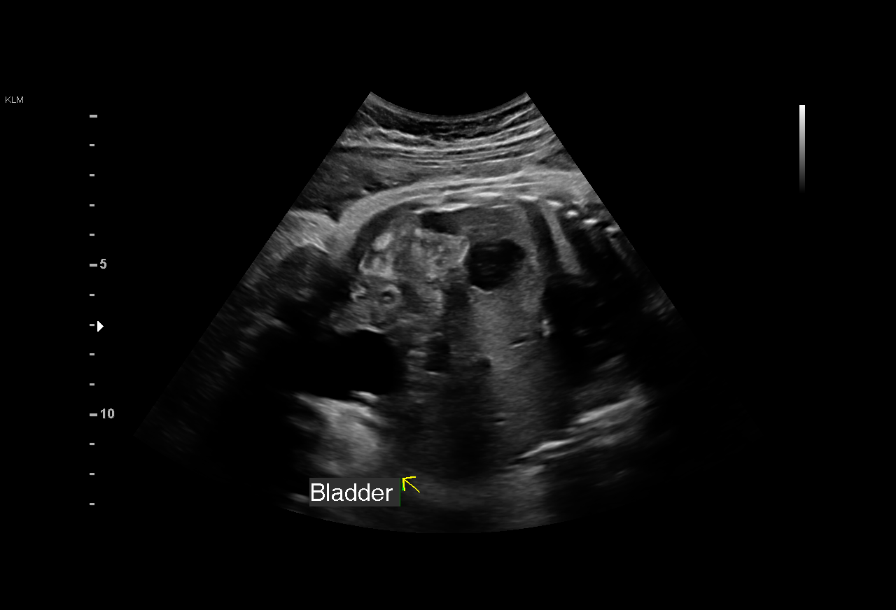
[im 8/16]
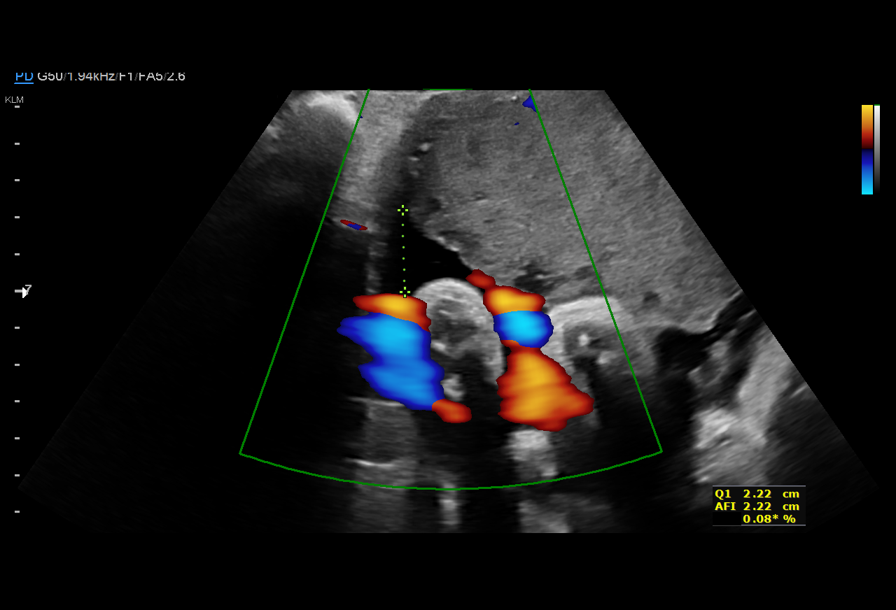
[im 9/16]
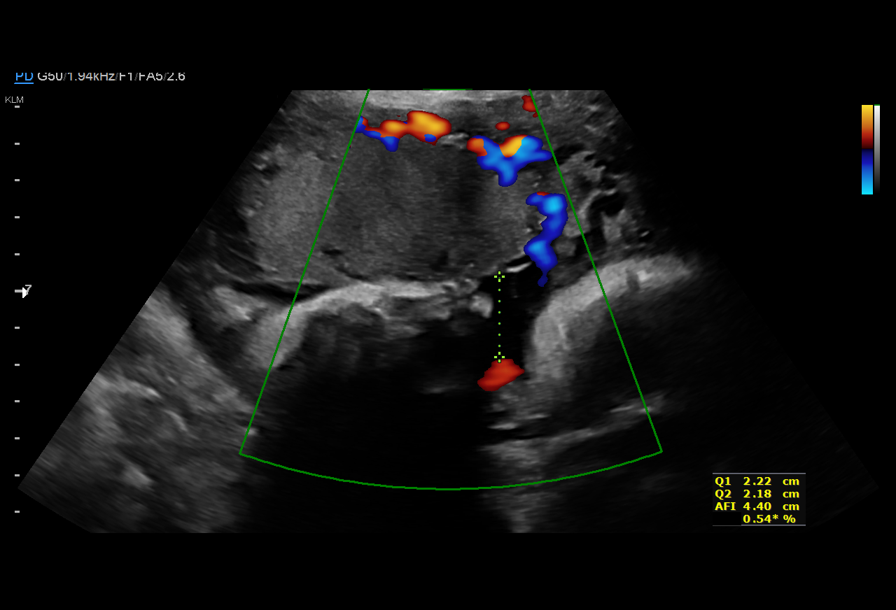
[im 11/16]
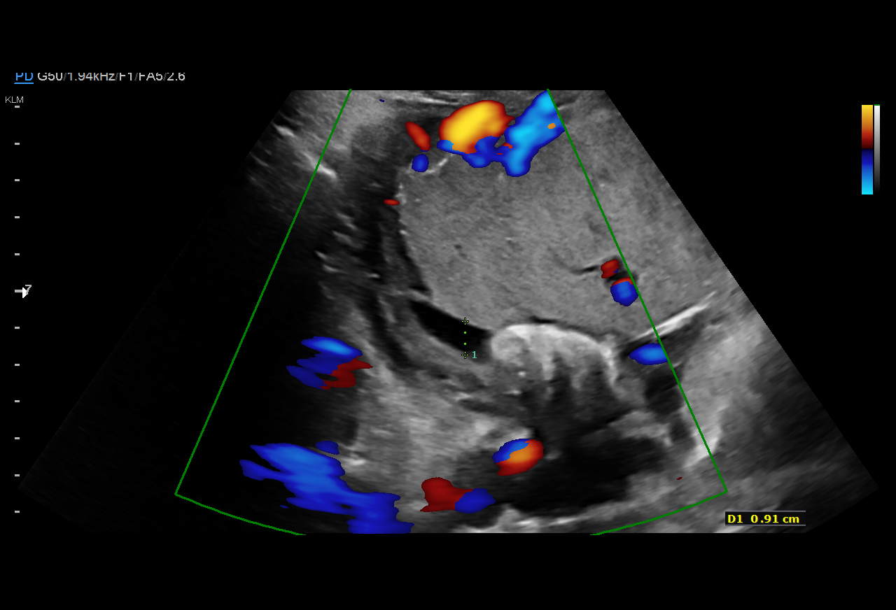
[im 12/16]
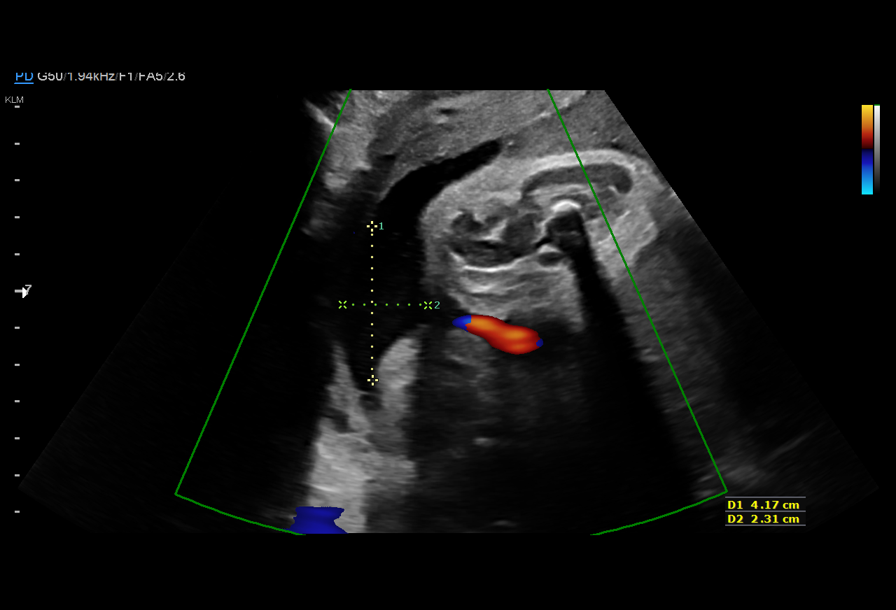
[im 13/16]
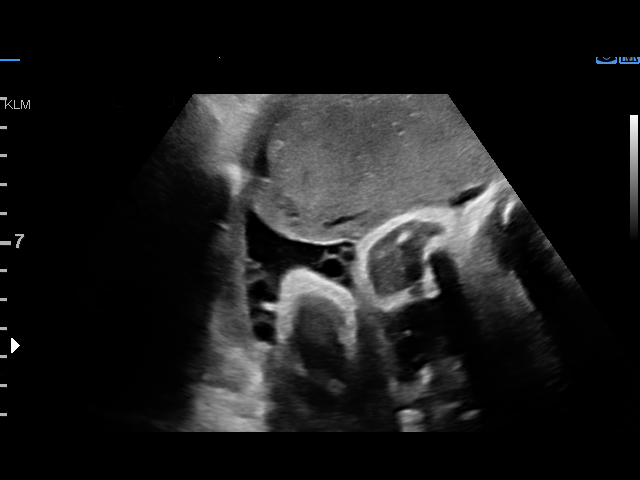
[im 15/16]
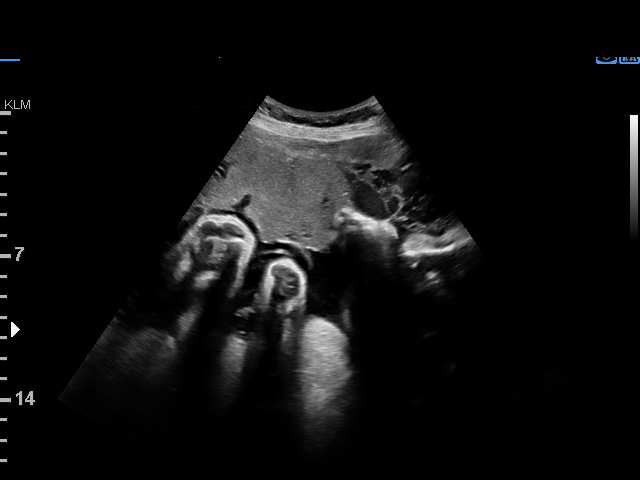
[im 16/16]
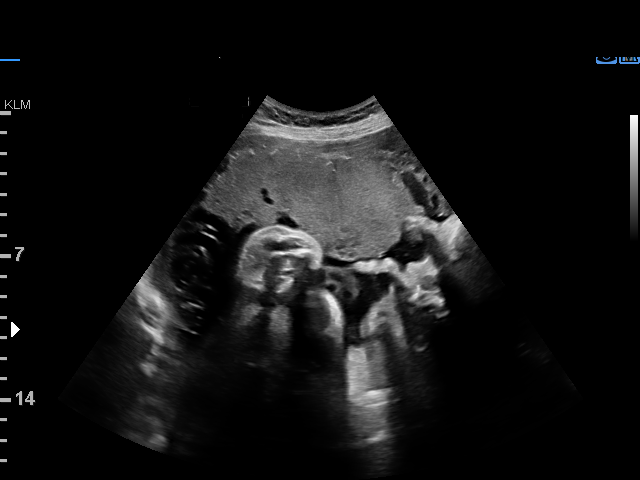

[12 of 16 positions shown; findings below may reference images not displayed]

W/NONSTRESS                                       AZAMAT
 ----------------------------------------------------------------------

 ----------------------------------------------------------------------
Indications

  Advanced maternal age multigravida 35+,
  third trimester (low risk NIPS)
  35 weeks gestation of pregnancy
  Previous cesarean delivery, antepartum x4
  Poor obstetric history: Previous IUFD
  (stillbirth) (Twin A-at 37 weeks)
  Gestational diabetes in pregnancy, diet
  controlled
  Short interval between pregancies, 3rd
  trimester
 ----------------------------------------------------------------------
Vital Signs

                                                Height:        5'2"
Fetal Evaluation

 Num Of Fetuses:         1
 Fetal Heart Rate(bpm):  137
 Cardiac Activity:       Observed
 Presentation:           Cephalic
 Placenta:               Anterior

 Amniotic Fluid
 AFI FV:      Subjectively low-normal

 AFI Sum(cm)     %Tile       Largest Pocket(cm)
 5.46            < 3

 RUQ(cm)                     LUQ(cm)        LLQ(cm)

 Comment:    [DATE] BPP in 23 minutes.
Biophysical Evaluation

 Amniotic F.V:   Within normal limits       F. Tone:        Observed
 F. Movement:    Observed                   N.S.T:          Reactive
 F. Breathing:   Observed                   Score:          [DATE]
OB History

 Gravidity:    8         Term:   4        Prem:   0        SAB:   2
 TOP:          1       Ectopic:  0        Living: 4
Gestational Age

 LMP:           36w 5d        Date:  07/24/17                 EDD:   04/30/18
 Best:          35w 5d     Det. By:  U/S  (12/05/17)          EDD:   05/07/18
Anatomy

 Stomach:               Appears normal, left   Bladder:                Appears normal
                        sided
Impression

 Amniotic fluid is decreased for this gestational age (AFI=6
 cm) and the deepest vertical pocket was above 2 cm.
 Antenatal testing is reassuring. NST is reactive. BPP [DATE].

 I explained the findings and reassured her.
Recommendations

 BPP on 04/11/18.
                 Halterman, Yanin

## 2020-06-24 ENCOUNTER — Emergency Department (HOSPITAL_BASED_OUTPATIENT_CLINIC_OR_DEPARTMENT_OTHER): Payer: Medicaid Other

## 2020-06-24 ENCOUNTER — Other Ambulatory Visit: Payer: Self-pay

## 2020-06-24 ENCOUNTER — Encounter (HOSPITAL_BASED_OUTPATIENT_CLINIC_OR_DEPARTMENT_OTHER): Payer: Self-pay | Admitting: Emergency Medicine

## 2020-06-24 ENCOUNTER — Emergency Department (HOSPITAL_BASED_OUTPATIENT_CLINIC_OR_DEPARTMENT_OTHER)
Admission: EM | Admit: 2020-06-24 | Discharge: 2020-06-24 | Disposition: A | Discharge status: Home / Self Care | Payer: Medicaid Other | Attending: Emergency Medicine | Admitting: Emergency Medicine

## 2020-06-24 DIAGNOSIS — R1033 Periumbilical pain: Secondary | ICD-10-CM

## 2020-06-24 DIAGNOSIS — E119 Type 2 diabetes mellitus without complications: Secondary | ICD-10-CM | POA: Diagnosis not present

## 2020-06-24 DIAGNOSIS — R1084 Generalized abdominal pain: Secondary | ICD-10-CM | POA: Diagnosis present

## 2020-06-24 DIAGNOSIS — N83292 Other ovarian cyst, left side: Secondary | ICD-10-CM | POA: Diagnosis not present

## 2020-06-24 DIAGNOSIS — Z9851 Tubal ligation status: Secondary | ICD-10-CM | POA: Insufficient documentation

## 2020-06-24 DIAGNOSIS — N83202 Unspecified ovarian cyst, left side: Secondary | ICD-10-CM

## 2020-06-24 LAB — URINALYSIS, ROUTINE W REFLEX MICROSCOPIC
Bilirubin Urine: NEGATIVE
Glucose, UA: NEGATIVE mg/dL
Hgb urine dipstick: NEGATIVE
Ketones, ur: NEGATIVE mg/dL
Leukocytes,Ua: NEGATIVE
Nitrite: NEGATIVE
Protein, ur: NEGATIVE mg/dL
Specific Gravity, Urine: 1.01 (ref 1.005–1.030)
pH: 7.5 (ref 5.0–8.0)

## 2020-06-24 LAB — CBC
HCT: 38.4 % (ref 36.0–46.0)
Hemoglobin: 12.7 g/dL (ref 12.0–15.0)
MCH: 30 pg (ref 26.0–34.0)
MCHC: 33.1 g/dL (ref 30.0–36.0)
MCV: 90.8 fL (ref 80.0–100.0)
Platelets: 273 10*3/uL (ref 150–400)
RBC: 4.23 MIL/uL (ref 3.87–5.11)
RDW: 13.2 % (ref 11.5–15.5)
WBC: 6 10*3/uL (ref 4.0–10.5)
nRBC: 0 % (ref 0.0–0.2)

## 2020-06-24 LAB — COMPREHENSIVE METABOLIC PANEL
ALT: 23 U/L (ref 0–44)
AST: 22 U/L (ref 15–41)
Albumin: 4.1 g/dL (ref 3.5–5.0)
Alkaline Phosphatase: 59 U/L (ref 38–126)
Anion gap: 8 (ref 5–15)
BUN: 10 mg/dL (ref 6–20)
CO2: 26 mmol/L (ref 22–32)
Calcium: 9.4 mg/dL (ref 8.9–10.3)
Chloride: 102 mmol/L (ref 98–111)
Creatinine, Ser: 0.69 mg/dL (ref 0.44–1.00)
GFR, Estimated: >60 mL/min (ref >60)
Glucose, Bld: 87 mg/dL (ref 70–99)
Potassium: 3.6 mmol/L (ref 3.5–5.1)
Sodium: 136 mmol/L (ref 135–145)
Total Bilirubin: 0.4 mg/dL (ref 0.3–1.2)
Total Protein: 7.7 g/dL (ref 6.5–8.1)

## 2020-06-24 LAB — PREGNANCY, URINE: Preg Test, Ur: NEGATIVE

## 2020-06-24 LAB — LIPASE, BLOOD: Lipase: 21 U/L (ref 11–51)

## 2020-06-24 MED ORDER — KETOROLAC TROMETHAMINE 30 MG/ML IJ SOLN
30.0000 mg | Freq: Once | INTRAMUSCULAR | Status: AC
  Administered 2020-06-24: 30 mg via INTRAVENOUS
  Filled 2020-06-24: qty 1

## 2020-06-24 MED ORDER — FENTANYL CITRATE (PF) 100 MCG/2ML IJ SOLN
50.0000 ug | Freq: Once | INTRAMUSCULAR | Status: AC
Start: 2020-06-24 — End: 2020-06-24
  Administered 2020-06-24: 50 ug via INTRAVENOUS
  Filled 2020-06-24: qty 2

## 2020-06-24 MED ORDER — IOHEXOL 300 MG/ML  SOLN
100.0000 mL | Freq: Once | INTRAMUSCULAR | Status: AC | PRN
  Administered 2020-06-24: 100 mL via INTRAVENOUS

## 2020-06-24 NOTE — ED Triage Notes (Signed)
Lower abdominal pain since this am ( over bladder).  No vaginal discharge.  No fever.

## 2020-06-24 NOTE — Discharge Instructions (Signed)
You were evaluated in the emergency department today for your abdominal pain.  Your blood work, vital signs, and your CT scan were very reassuring.  There is not appear to be any infection in your abdomen, however the CT scan revealed ruptured ovarian cyst.  When this happens it can be irritating the lining of your abdomen and cause severe pain however it is not dangerous.  You may continue to take Tylenol and ibuprofen as needed at home for your pain.  I recommend you follow-up closely with your OB/GYN next week for reevaluation of your symptoms.    Return to the emergency department if you develop worsening abdominal pain, nausea vomiting that does not stop, blood in your stool, or any other new severe symptoms.

## 2020-06-24 NOTE — ED Notes (Signed)
Patient transported to
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
## 2020-06-29 ENCOUNTER — Other Ambulatory Visit: Payer: Self-pay

## 2020-06-29 ENCOUNTER — Ambulatory Visit (INDEPENDENT_AMBULATORY_CARE_PROVIDER_SITE_OTHER): Payer: Medicaid Other | Admitting: Obstetrics

## 2020-06-29 ENCOUNTER — Encounter: Payer: Self-pay | Admitting: Obstetrics

## 2020-06-29 VITALS — BP 116/73 | HR 66 | Ht 62.0 in | Wt 167.2 lb

## 2020-06-29 DIAGNOSIS — Z8742 Personal history of other diseases of the female genital tract: Secondary | ICD-10-CM

## 2020-06-29 DIAGNOSIS — M62830 Muscle spasm of back: Secondary | ICD-10-CM | POA: Diagnosis not present

## 2020-06-29 MED ORDER — IBUPROFEN 800 MG PO TABS: 800.0000 mg | ORAL_TABLET | Freq: Three times a day (TID) | ORAL | 5 refills | Status: DC | PRN

## 2020-06-29 MED ORDER — CYCLOBENZAPRINE HCL 10 MG PO TABS: 10.0000 mg | ORAL_TABLET | Freq: Three times a day (TID) | ORAL | 1 refills | Status: DC | PRN

## 2020-06-29 NOTE — Progress Notes (Signed)
Pt is in the office for hospital follow up after ruptured cyst on  06-24-20. Pt reports that she has intermittent pain on left side 5/10. Pt states that she has been taking tylenol to help with the pain.

## 2020-06-29 NOTE — Progress Notes (Signed)
Patient ID: Barbara Todd, female   DOB: 1980/07/23, 40 y.o.   MRN: [id number]  Chief Complaint  Patient presents with  . Follow-up    HPI Barbara Todd is a 40 y.o. female.  Patient presents in follow up from evaluation in ER for a ruptured ovarian cyst.  She states the pain is much less, not requiring pain meds.  She has a history of chronic back problems, and now has some pain in left side when lying on that side.  HPI  Past Medical History:  Diagnosis Date  . Chronic back pain   . Diabetes mellitus without complication (HCC)   . Gestational diabetes   . Numbness and tingling     Past Surgical History:  Procedure Laterality Date  . CESAREAN SECTION     total of 4  . CESAREAN SECTION MULTI-GESTATIONAL WITH TUBAL Bilateral 01/23/2017   Procedure: CESAREAN SECTION MULTI-GESTATIONAL;  Surgeon: Tilda Burrow, MD;  Location: [facility] BIRTHING SUITES;  Service: Obstetrics;  Laterality: Bilateral;  . CESAREAN SECTION WITH BILATERAL TUBAL LIGATION N/A 04/21/2018   Procedure: REPEAT CESAREAN SECTION WITH BILATERAL TUBAL LIGATION;  Surgeon: Adam Phenix, MD;  Location: [facility] BIRTHING SUITES;  Service: Obstetrics;  Laterality: N/A;  . DILATION AND EVACUATION N/A 08/30/2014   Procedure: DILATATION AND EVACUATION;  Surgeon: Catalina Antigua, MD;  Location: [facility] OR[facility];  Service: Gynecology;  Laterality: N/A;    Family History  Problem Relation Age of Onset  . Hypertension Mother     Social History Social History   Tobacco Use  . Smoking status: Never Smoker  . Smokeless tobacco: Never Used  Vaping Use  . Vaping Use: Never used  Substance Use Topics  . Alcohol use: No  . Drug use: No    No Known Allergies  Current Outpatient Medications  Medication Sig Dispense Refill  . acetaminophen (TYLENOL) 325 MG tablet Take 650 mg by mouth every 6 (six) hours as needed.    . cyclobenzaprine (FLEXERIL) 10 MG tablet Take 1 tablet (10 mg total) by mouth every 8 (eight) hours as needed for muscle  spasms. 30 tablet 1  . ibuprofen (ADVIL) 800 MG tablet Take 1 tablet (800 mg total) by mouth every 8 (eight) hours as needed. 30 tablet 5  . ibuprofen (ADVIL,MOTRIN) 600 MG tablet Take 1 tablet (600 mg total) by mouth every 6 (six) hours as needed for mild pain, moderate pain or cramping. (Patient not taking: No sig reported) 30 tablet 0  . Prenat-FeAsp-Meth-FA-DHA w/o A (PRENATE PIXIE) 10-0.6-0.4-200 MG CAPS Take 1 tablet by mouth daily. (Patient not taking: No sig reported) 30 capsule 12   No current facility-administered medications for this visit.    Review of Systems Review of Systems Constitutional: negative for fatigue and weight loss Respiratory: negative for cough and wheezing Cardiovascular: negative for chest pain, fatigue and palpitations Gastrointestinal: negative for abdominal pain and change in bowel habits Genitourinary: positive for pelvic pain Integument/breast: negative for nipple discharge Musculoskeletal: positive for myalgias - backache and pain in left side Neurological: negative for gait problems and tremors Behavioral/Psych: negative for abusive relationship, depression Endocrine: negative for temperature intolerance      Blood pressure 116/73, pulse 66, height 5\' 2"  (1.575 m), weight 167 lb 3.2 oz (75.8 kg), last menstrual period 06/03/2020, currently breastfeeding.  Physical Exam Physical Exam : Deferred  50% of 15 min visit spent on counseling and coordination of care.   Data Reviewed CT of abdomen and pelvis  Assessment  1. Hx of ovarian cyst - left, ruptured - doing well, resolving  2. Spasm of muscle of lower back Rx: - Ambulatory referral to Orthopedics - ibuprofen (ADVIL) 800 MG tablet; Take 1 tablet (800 mg total) by mouth every 8 (eight) hours as needed.  Dispense: 30 tablet; Refill: 5 - cyclobenzaprine (FLEXERIL) 10 MG tablet; Take 1 tablet (10 mg total) by mouth every 8 (eight) hours as needed for muscle spasms.  Dispense: 30 tablet;  Refill: 1    Plan   Follow up prn and in 3 months for Annual / Pap  Orders Placed This Encounter  Procedures  . Ambulatory referral to Orthopedics    Referral Priority:   Routine    Referral Type:   Consultation    Number of Visits Requested:   1   Meds ordered this encounter  Medications  . ibuprofen (ADVIL) 800 MG tablet    Sig: Take 1 tablet (800 mg total) by mouth every 8 (eight) hours as needed.    Dispense:  30 tablet    Refill:  5  . cyclobenzaprine (FLEXERIL) 10 MG tablet    Sig: Take 1 tablet (10 mg total) by mouth every 8 (eight) hours as needed for muscle spasms.    Dispense:  30 tablet    Refill:  1     Brock Bad, MD 06/29/2020 1:57 PM

## 2020-07-08 ENCOUNTER — Ambulatory Visit (INDEPENDENT_AMBULATORY_CARE_PROVIDER_SITE_OTHER): Payer: Medicaid Other | Admitting: Family Medicine

## 2020-07-08 ENCOUNTER — Other Ambulatory Visit: Payer: Self-pay

## 2020-07-08 ENCOUNTER — Ambulatory Visit (INDEPENDENT_AMBULATORY_CARE_PROVIDER_SITE_OTHER): Payer: Medicaid Other

## 2020-07-08 ENCOUNTER — Encounter: Payer: Self-pay | Admitting: Family Medicine

## 2020-07-08 DIAGNOSIS — M5441 Lumbago with sciatica, right side: Secondary | ICD-10-CM

## 2020-07-08 DIAGNOSIS — G8929 Other chronic pain: Secondary | ICD-10-CM | POA: Diagnosis not present

## 2020-07-08 MED ORDER — BACLOFEN 10 MG PO TABS: 5.0000 mg | ORAL_TABLET | Freq: Three times a day (TID) | ORAL | 3 refills | Status: AC | PRN

## 2020-07-08 NOTE — Progress Notes (Signed)
Office Visit Note   Patient: Barbara Todd           Date of Birth: April 20, 1981           MRN: [id number] Visit Date: 07/08/2020 Requested by: Brock Bad, MD [address] [place],  [place] [address] PCP: Verlon Au, MD  Subjective: Chief Complaint  Patient presents with  . Lower Back - Pain    Muscles spasms across the lower back, worse on the right. Has had this issue since a c-section that she had in 2018. She had pain in the back while pregnant (twins) - she was wearing a back brace that the doctor ordered. Occasional pain and "heaviness" in the right leg. No numbness/tingling.     HPI: She is here with low back pain.  Symptoms started in 2018.  She was pregnant with twins and had pain during the pregnancy.  Unfortunately one of the twins did not survive, she had a C-section for the other 1.  Even after delivery, she continued to have pain.  The pain has been intermittent, some days she feels fine another day she has pain.  She was pregnant again since then and had increasing pain during that pregnancy but did not require a back brace.  She gets occasional heaviness sensation in her right leg but no numbness or tingling.  She does not take medications on a regular basis.  She has 5 daughters, the oldest is 31.                ROS:   All other systems were reviewed and are negative.  Objective: Vital Signs: There were no vitals taken for this visit.  Physical Exam:  General:  Alert and oriented, in no acute distress. Pulm:  Breathing unlabored. Psy:  Normal mood, congruent affect. Low back: She has exaggerated lumbar lordosis with tenderness throughout the lumbar paraspinous muscles.  No significant sciatic notch pain, no SI joint tenderness.  Straight leg raise is negative, lower extremity strength and reflexes are normal.    Imaging: XR Lumbar Spine 2-3 Views  Result Date: 07/08/2020 X-rays lumbar spine reveal normal bony anatomy with no  degenerative change, no sign of compression fracture.  She has well-preserved hip joints.  There is exaggerated lumbar lordosis.   Assessment & Plan: 1.  Chronic low back pain, suspect myofascial pain with facet syndrome, exacerbated by lumbar lordosis and tight hip flexors. -We elected to try physical therapy at [place] [facility] location.  Baclofen as needed.  MRI scan if she fails to improve.     Procedures: No procedures performed        PMFS History: Patient Active Problem List   Diagnosis Date Noted  . 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 Unwanted fertility 01/14/2018  . History of cesarean delivery, antepartum 10/21/2017  . History of gestational diabetes 02/21/2017   Past Medical History:  Diagnosis Date  . Chronic back pain   . Diabetes mellitus without complication (HCC)   . Gestational diabetes   . Numbness and tingling     Family History  Problem Relation Age of Onset  . Hypertension Mother     Past Surgical History:  Procedure Laterality Date  . CESAREAN SECTION     total of 4  . CESAREAN SECTION MULTI-GESTATIONAL WITH TUBAL Bilateral 01/23/2017   Procedure: CESAREAN SECTION MULTI-GESTATIONAL;  Surgeon: Tilda Burrow, MD;  Location: [facility] BIRTHING SUITES;  Service: Obstetrics;  Laterality: Bilateral;  . CESAREAN SECTION WITH  BILATERAL TUBAL LIGATION N/A 04/21/2018   Procedure: REPEAT CESAREAN SECTION WITH BILATERAL TUBAL LIGATION;  Surgeon: Adam Phenix, MD;  Location: [facility] BIRTHING SUITES;  Service: Obstetrics;  Laterality: N/A;  . 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 Smoking status: Never Smoker  . Smokeless tobacco: Never Used  Vaping Use  . Vaping Use: Never used  Substance and Sexual Activity  . Alcohol use: No  . Drug use: No  . Sexual  activity: Yes    Partners: Male    Birth control/protection: None

## 2020-07-21 ENCOUNTER — Ambulatory Visit: Payer: Medicaid Other

## 2020-08-01 ENCOUNTER — Ambulatory Visit: Payer: Medicaid Other | Attending: Family Medicine

## 2020-08-01 ENCOUNTER — Other Ambulatory Visit: Payer: Self-pay

## 2020-08-01 DIAGNOSIS — M5441 Lumbago with sciatica, right side: Secondary | ICD-10-CM | POA: Insufficient documentation

## 2020-08-01 DIAGNOSIS — M6281 Muscle weakness (generalized): Secondary | ICD-10-CM | POA: Diagnosis present

## 2020-08-01 DIAGNOSIS — M542 Cervicalgia: Secondary | ICD-10-CM

## 2020-08-01 DIAGNOSIS — G8929 Other chronic pain: Secondary | ICD-10-CM | POA: Diagnosis present

## 2020-08-01 DIAGNOSIS — R252 Cramp and spasm: Secondary | ICD-10-CM | POA: Diagnosis present

## 2020-08-01 DIAGNOSIS — R293 Abnormal posture: Secondary | ICD-10-CM | POA: Diagnosis present

## 2020-08-01 NOTE — Patient Instructions (Signed)
Access Code: [email or web address] URL: [email or web address] Date: 08/01/2020 Prepared by: Claude Manges  Exercises Seated Gentle Upper Trapezius Stretch - 1 x daily - 7 x weekly - 4 sets - 30 seconds hold Hooklying Single Knee to Chest Stretch - 1 x daily - 7 x weekly - 4 sets - 30 seconds hold Supine Lower Trunk Rotation - 1 x daily - 7 x weekly - 10 reps - 10 seconds hold Seated Posterior Pelvic Tilt - 1 x daily - 7 x weekly - 2 sets - 10 reps Lateral Shift Correction at Wall - 1 x daily - 7 x weekly - 1 sets - 10 reps Heat - 1 x daily - 7 x weekly - 10-20 minutes hold

## 2020-08-02 NOTE — Therapy (Signed)
[facility] [address] [place], [place], [address] Phone: [phone]   Fax:  [phone]  Physical Therapy Evaluation  Patient Details  Name: Barbara Todd MRN: [id number] Date of Birth: Sep 19, 1980 Referring Provider (PT): Hilts   Encounter Date: 08/01/2020   PT End of Session - 08/01/20 1657    Visit Number 1    Number of Visits 9    Date for PT Re-Evaluation 10/03/20    Authorization Type [place] Medicaid [employer]    PT Start Time 1615    PT Stop Time 1700    PT Time Calculation (min) 45 min    Activity Tolerance Patient tolerated treatment well    Behavior During Therapy [facility] for tasks assessed/performed           Past Medical History:  Diagnosis Date  . Chronic back pain   . Diabetes mellitus without complication (HCC)   . Gestational diabetes   . Numbness and tingling     Past Surgical History:  Procedure Laterality Date  . CESAREAN SECTION     total of 4  . CESAREAN SECTION MULTI-GESTATIONAL WITH TUBAL Bilateral 01/23/2017   Procedure: CESAREAN SECTION MULTI-GESTATIONAL;  Surgeon: Tilda Burrow, MD;  Location: [facility] BIRTHING SUITES;  Service: Obstetrics;  Laterality: Bilateral;  . CESAREAN SECTION WITH BILATERAL TUBAL LIGATION N/A 04/21/2018   Procedure: REPEAT CESAREAN SECTION WITH BILATERAL TUBAL LIGATION;  Surgeon: Adam Phenix, MD;  Location: [facility] BIRTHING SUITES;  Service: Obstetrics;  Laterality: N/A;  . DILATION AND EVACUATION N/A 08/30/2014   Procedure: DILATATION AND EVACUATION;  Surgeon: Catalina Antigua, MD;  Location: [facility];  Service: Gynecology;  Laterality: N/A;    There were no vitals filed for this visit.    Subjective Assessment - 08/01/20 1619    Subjective Has a history of long standing LBP but reports at time of pregnancy for twins in 2018 she experienced incr LBP and used a support belt with some help. Has 5 girls (2 - 85 yo), frequently braiding their hair and gets alot of right sided neck  pain. "today is a good day". Unable to lay on stomach to sleep (original position of comfort), not getting as restful sleep. Pain tends to get worse as the day goes on on some days, other days wakes up with it. Sometimes feels heaviness into the legs (R>L) at night. "I always feel on edge". Often when she feels nervous  pain tends to worsen.    Pertinent History gestational DM, hx cesarean x 4 most recent 2019, Chronic LBP, goes to therapy for traumatic birth (loss of a twin during previous pregnancy)    How long can you sit comfortably? needs to change positions.    Patient Stated Goals decrease pain, "to feel comfortable again"    Currently in Pain? Yes    Pain Score 3     Pain Location Back    Pain Orientation Lower;Mid    Pain Descriptors / Indicators Pressure;Aching    Pain Type Acute pain;Chronic pain    Pain Radiating Towards mid back, right hip    Pain Onset More than a month ago    Pain Frequency Intermittent    Pain Relieving Factors prescrption pain med as needed , tylenol, heat pad with some relief    Effect of Pain on Daily Activities When pain is there, dealing with the pain with ADLs then toss and turn to rest at night, Pain with caring for girls  And doing housework  [facility] PT Assessment - 08/01/20 1621      Assessment   Medical Diagnosis Chronic LBP with Right sciatica, neck pain    Referring Provider (PT) Hilts    Hand Dominance Left    Next MD Visit TBD    Prior Therapy around 2019 for back pain, reports never getting full relief of pain.      Balance Screen   Has the patient fallen in the past 6 months No    Has the patient had a decrease in activity level because of a fear of falling?  No    Is the patient reluctant to leave their home because of a fear of falling?  No      Home Environment   Additional Comments House. level entry. flight of 12 steps between floors.      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   Lumbar Flexion 100%    Lumbar Extension 75%    Lumbar - Right Side Bend slightly below lateral knee jt , les [place]    Lumbar - Left Side Bend to lateral knee line + pain R low back    Lumbar - Right Rotation 100%    Lumbar - Left Rotation 100%      Strength   Overall Strength Comments Hip flexion: L 4/5 R 4-/5. Knee flex/ext R 4+/5, L 3+/5. Decreased functional core/abdominal strength, difficulty engaging abdominals     Flexibility   Soft Tissue Assessment /Muscle Length yes    Quadriceps mod tightness B      Palpation   Spinal mobility slighlty hypomobile to P-A  thoracic and lumbar spine    Palpation comment TTP, muscle spasm and tension along thoracolumbar paraspinals (R>L), B QL (R>L), right periscapular mms and UT.                      Objective measurements completed on examination: See above findings.               PT Education - 08/01/20 1656    Education Details PT POC and HEP. [id number]    Person(s) Educated Patient    Methods Explanation;Demonstration;Handout    Comprehension Verbalized understanding;Returned demonstration;Need further instruction            PT Short Term Goals - 08/01/20 1821      PT SHORT TERM GOAL #1   Title Independent with initial HEP    Time 2    Period Weeks    Status New    Target Date 08/15/20      PT SHORT TERM GOAL #2   Title Pt will be able to demo midline neutral spine posture in both sitting and standing    Time 2    Period Weeks    Status New    Target Date  08/15/20      PT SHORT TERM GOAL #3   Title Pt will be demo good  body mechanics with lifting and moving weighted items to facilitate ability to pick up children safely    Time 2    Period Weeks    Status New    Target Date 08/15/20             PT Long Term Goals - 08/01/20 1821      PT LONG TERM GOAL #1   Title Independent with advanced HEP    Time 8    Period Weeks    Status New    Target Date 10/03/20      PT LONG TERM GOAL #2   Title BLE strength symmetrical to at least 4+/5    Time 8    Period Weeks    Status New    Target Date 10/03/20      PT LONG TERM GOAL #3   Title Pt will report </= 2/10 back pain without radiating symptoms into the LE with daily tasks and caregiving of children at home    Time 8    Period Weeks    Status New    Target Date 10/03/20      PT LONG TERM GOAL #4   Title Pt will be able to return to independent exercise program not limited by pain    Time 8    Period Weeks    Status New    Target Date 10/03/20                  Plan - 08/01/20 1658    Clinical Impression Statement Pt is a 40 yo female who presents with chronic low back pain with some radiation into the right hip, in addition to some right sided neck pain. She reports muscle spasms across the lower back (R>L) with occasional pain and "heaviness" into the right leg which she has had since a c-section that she had in 2018 for her twins, at which time she was given a support  brace that helped a bit.  She presents with significant mms guarding and areas of spasm along thoracolumbar paraspinals, QL (R>L). She presents with core and LE mms weakness (right side stronger than left), LE  and back mms tightness, and abnormal posture with thoracic spine/rib cage shift toward the left with hip shift right. She will benefit from skilled PT to address aforementioned issues, decrease pain and improve functional posture and mobility.    Personal Factors and Comorbidities Past/Current  Experience    Stability/Clinical Decision Making Evolving/Moderate complexity    Clinical Decision Making Moderate    Rehab Potential Good    PT Frequency 1x / week    PT Duration 8 weeks    PT Treatment/Interventions ADLs/Self Care Home Management;Cryotherapy;Electrical Stimulation;Iontophoresis 4mg /ml Dexamethasone;Moist Heat;Traction;Neuromuscular re-education;Therapeutic exercise;Therapeutic activities;Functional mobility training;Patient/family education;Manual techniques;Energy conservation;Dry needling;Taping    PT Next Visit Plan Reassess HEP. Gentle spinal mobility, core stabilization and LE strengthening, and hip flexor stretch/mobility B. Manual and modalities as needed for trigger points and spasms along the back.    PT Home Exercise Plan see pt edu    Consulted and Agree with Plan of Care Patient           Patient will benefit from skilled therapeutic intervention in order to improve the following deficits and impairments:  Decreased range of motion,Increased muscle spasms,Pain,Impaired flexibility,Decreased strength,Postural dysfunction,Improper body mechanics  Visit Diagnosis: Chronic low back pain with right-sided sciatica, unspecified back pain laterality - Plan: PT plan of care cert/re-cert  Abnormal posture - Plan: PT plan of  care cert/re-cert  Muscle weakness (generalized) - Plan: PT plan of care cert/re-cert  Cramp and spasm - Plan: PT plan of care cert/re-cert  Cervicalgia - Plan: PT plan of care cert/re-cert     Problem List Patient Active Problem List   Diagnosis Date Noted  . 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 Unwanted fertility 01/14/2018  . History of cesarean delivery, antepartum 10/21/2017  . History of gestational diabetes 02/21/2017    Anson Crofts, PT, DPT 08/02/2020, 9:13 AM  [facility] [address] [place], [place], [address] Phone: [phone]   Fax:  [phone]  Name: CHANTRELL APSEY MRN: [id number] Date of Birth: 1980/09/13

## 2020-08-09 ENCOUNTER — Ambulatory Visit: Payer: Medicaid Other | Admitting: Physical Therapy

## 2020-08-15 ENCOUNTER — Other Ambulatory Visit: Payer: Self-pay

## 2020-08-15 ENCOUNTER — Ambulatory Visit: Payer: Medicaid Other | Attending: Family Medicine

## 2020-08-15 DIAGNOSIS — M542 Cervicalgia: Secondary | ICD-10-CM | POA: Diagnosis not present

## 2020-08-15 DIAGNOSIS — M6281 Muscle weakness (generalized): Secondary | ICD-10-CM | POA: Insufficient documentation

## 2020-08-15 DIAGNOSIS — G8929 Other chronic pain: Secondary | ICD-10-CM | POA: Insufficient documentation

## 2020-08-15 DIAGNOSIS — M5441 Lumbago with sciatica, right side: Secondary | ICD-10-CM | POA: Diagnosis not present

## 2020-08-15 DIAGNOSIS — R252 Cramp and spasm: Secondary | ICD-10-CM

## 2020-08-15 DIAGNOSIS — R293 Abnormal posture: Secondary | ICD-10-CM | POA: Insufficient documentation

## 2020-08-15 NOTE — Therapy (Signed)
[facility] [address] [place], [place], [address] Phone: [phone]   Fax:  [phone]  Physical Therapy Treatment  Patient Details  Name: Barbara Todd MRN: [id number] Date of Birth: 1981-03-23 Referring Provider (PT): Hilts   Encounter Date: 08/15/2020   PT End of Session - 08/15/20 1554    Visit Number 2    Number of Visits 10    Date for PT Re-Evaluation 09/30/20    Authorization Type [place] Medicaid [employer]    PT Start Time 1555    PT Stop Time 1640    PT Time Calculation (min) 45 min    Activity Tolerance Patient tolerated treatment well    Behavior During Therapy [facility] for tasks assessed/performed           Past Medical History:  Diagnosis Date  . Chronic back pain   . Diabetes mellitus without complication (HCC)   . Gestational diabetes   . Numbness and tingling     Past Surgical History:  Procedure Laterality Date  . CESAREAN SECTION     total of 4  . CESAREAN SECTION MULTI-GESTATIONAL WITH TUBAL Bilateral 01/23/2017   Procedure: CESAREAN SECTION MULTI-GESTATIONAL;  Surgeon: Tilda Burrow, MD;  Location: [facility] BIRTHING SUITES;  Service: Obstetrics;  Laterality: Bilateral;  . CESAREAN SECTION WITH BILATERAL TUBAL LIGATION N/A 04/21/2018   Procedure: REPEAT CESAREAN SECTION WITH BILATERAL TUBAL LIGATION;  Surgeon: Adam Phenix, MD;  Location: [facility] BIRTHING SUITES;  Service: Obstetrics;  Laterality: N/A;  . DILATION AND EVACUATION N/A 08/30/2014   Procedure: DILATATION AND EVACUATION;  Surgeon: Catalina Antigua, MD;  Location: [facility];  Service: Gynecology;  Laterality: N/A;    There were no vitals filed for this visit.   Subjective Assessment - 08/15/20 1556    Subjective Just a bit tired today, exercises going well just sore.    Pertinent History gestational DM, hx cesarean x 4 most recent 2019, Chronic LBP, goes to therapy for traumatic birth (loss of a twin during previous pregnancy)    How long can you sit  comfortably? needs to change positions.    Patient Stated Goals decrease pain, "to feel comfortable again"    Currently in Pain? No/denies   "Just sore and a little tired"          [facility] Adult PT Treatment/Exercise - 08/15/20 0001      Exercises   Exercises Lumbar;Knee/Hip      Lumbar Exercises: Stretches   Lower Trunk Rotation 5 reps;10 seconds    Hip Flexor Stretch Right;Left;2 reps;20 seconds   2nd set with strap   Pelvic Tilt 15 reps   on dynadisc, with tactile cues. x15 AP, x 15 ML. x 10 clocks CW/CCW   Other Lumbar Stretch Exercise lateral shift correction x 5 B (left >R)    Other Lumbar Stretch Exercise QP cat camel x 10 with max tactile verbal cues. 1/2 kneel hip flexor stretch 20" B on mat table. UT stretch and LS stretch B  x 20 seconds each      Lumbar Exercises: Aerobic   Nustep L4 x 5 minutes BUE/LE      Lumbar Exercises: Standing   Theraband Level (Row) Level 2 (Red)   10 x 2   Theraband Level (Shoulder Extension) Level 2 (Red)   10 x 2   Other Standing Lumbar Exercises red TB horizontal ABD      Lumbar Exercises: Supine   Bridge 5 reps   slow and controlled  with PPT start                 PT Education - 08/15/20 1646    Education Details Added modified thomas and cat camel to initial HEP    Person(s) Educated Patient    Methods Demonstration;Handout;Explanation    Comprehension Verbalized understanding;Returned demonstration            PT Short Term Goals - 08/01/20 1821      PT SHORT TERM GOAL #1   Title Independent with initial HEP    Time 2    Period Weeks    Status New    Target Date 08/15/20      PT SHORT TERM GOAL #2   Title Pt will be able to demo midline neutral spine posture in both sitting and standing    Time 2    Period Weeks    Status New    Target Date 08/15/20      PT SHORT TERM GOAL #3   Title Pt will be demo good body mechanics with lifting and moving weighted items to facilitate ability to pick up children safely     Time 2    Period Weeks    Status New    Target Date 08/15/20             PT Long Term Goals - 08/01/20 1821      PT LONG TERM GOAL #1   Title Independent with advanced HEP    Time 8    Period Weeks    Status New    Target Date 10/03/20      PT LONG TERM GOAL #2   Title BLE strength symmetrical to at least 4+/5    Time 8    Period Weeks    Status New    Target Date 10/03/20      PT LONG TERM GOAL #3   Title Pt will report </= 2/10 back pain without radiating symptoms into the LE with daily tasks and caregiving of children at home    Time 8    Period Weeks    Status New    Target Date 10/03/20      PT LONG TERM GOAL #4   Title Pt will be able to return to independent exercise program not limited by pain    Time 8    Period Weeks    Status New    Target Date 10/03/20                 Plan - 08/15/20 1558    Clinical Impression Statement Tolerated exercises well today. Most difficulty with pelvic tilts, hip flexors tight. c/o some LLE heaviness with nustep but no weakness and motivated to continue. She tolerated exercise progressions well but is overall very stiff and weak especially in the core- will benefit from continued general trunk strengthening and core stabilization exercises. No c/o increased pain other than sensations of deep stretch when completing exercises today.    Personal Factors and Comorbidities Past/Current Experience    Rehab Potential Good    PT Frequency 1x / week    PT Duration 8 weeks    PT Treatment/Interventions ADLs/Self Care Home Management;Cryotherapy;Electrical Stimulation;Iontophoresis 4mg /ml Dexamethasone;Moist Heat;Traction;Neuromuscular re-education;Therapeutic exercise;Therapeutic activities;Functional mobility training;Patient/family education;Manual techniques;Energy conservation;Dry needling;Taping    PT Next Visit Plan Gentle spinal mobility, core stabilization and LE strengthening, and hip flexor stretch/mobility B. Manual and  modalities as needed for trigger points and spasms along the back.  PT Home Exercise Plan see pt edu    Consulted and Agree with Plan of Care Patient           Patient will benefit from skilled therapeutic intervention in order to improve the following deficits and impairments:  Decreased range of motion,Increased muscle spasms,Pain,Impaired flexibility,Decreased strength,Postural dysfunction,Improper body mechanics  Visit Diagnosis: Abnormal posture  Muscle weakness (generalized)  Cramp and spasm  Chronic low back pain with right-sided sciatica, unspecified back pain laterality  Cervicalgia     Problem List Patient Active Problem List   Diagnosis Date Noted  . 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 Unwanted fertility 01/14/2018  . History of cesarean delivery, antepartum 10/21/2017  . History of gestational diabetes 02/21/2017    Anson Crofts, PT, DPT 08/15/2020, 4:59 PM  [facility] [address] [place], [place], [address] Phone: [phone]   Fax:  [phone]  Name: Barbara Todd MRN: [id number] Date of Birth: 19-Oct-1980

## 2020-08-22 ENCOUNTER — Other Ambulatory Visit: Payer: Self-pay

## 2020-08-22 ENCOUNTER — Ambulatory Visit: Payer: Medicaid Other

## 2020-08-22 DIAGNOSIS — M542 Cervicalgia: Secondary | ICD-10-CM

## 2020-08-22 DIAGNOSIS — R252 Cramp and spasm: Secondary | ICD-10-CM

## 2020-08-22 DIAGNOSIS — M5441 Lumbago with sciatica, right side: Secondary | ICD-10-CM

## 2020-08-22 DIAGNOSIS — M6281 Muscle weakness (generalized): Secondary | ICD-10-CM

## 2020-08-22 DIAGNOSIS — G8929 Other chronic pain: Secondary | ICD-10-CM

## 2020-08-22 DIAGNOSIS — R293 Abnormal posture: Secondary | ICD-10-CM

## 2020-08-22 NOTE — Therapy (Signed)
[facility] [address] [place], [place], [address] Phone: [phone]   Fax:  [phone]  Physical Therapy Treatment  Patient Details  Name: Barbara Todd MRN: [id number] Date of Birth: 1980/07/09 Referring Provider (PT): Hilts   Encounter Date: 08/22/2020   PT End of Session - 08/22/20 1535    Visit Number 3    Number of Visits 10    Date for PT Re-Evaluation 09/30/20    Authorization Type [place] Medicaid [employer]    PT Start Time 1530    PT Stop Time 1620    PT Time Calculation (min) 50 min    Activity Tolerance Patient tolerated treatment well    Behavior During Therapy [facility] for tasks assessed/performed           Past Medical History:  Diagnosis Date  . Chronic back pain   . Diabetes mellitus without complication (HCC)   . Gestational diabetes   . Numbness and tingling     Past Surgical History:  Procedure Laterality Date  . CESAREAN SECTION     total of 4  . CESAREAN SECTION MULTI-GESTATIONAL WITH TUBAL Bilateral 01/23/2017   Procedure: CESAREAN SECTION MULTI-GESTATIONAL;  Surgeon: Tilda Burrow, MD;  Location: [facility] BIRTHING SUITES;  Service: Obstetrics;  Laterality: Bilateral;  . CESAREAN SECTION WITH BILATERAL TUBAL LIGATION N/A 04/21/2018   Procedure: REPEAT CESAREAN SECTION WITH BILATERAL TUBAL LIGATION;  Surgeon: Adam Phenix, MD;  Location: [facility] BIRTHING SUITES;  Service: Obstetrics;  Laterality: N/A;  . DILATION AND EVACUATION N/A 08/30/2014   Procedure: DILATATION AND EVACUATION;  Surgeon: Catalina Antigua, MD;  Location: [facility];  Service: Gynecology;  Laterality: N/A;    There were no vitals filed for this visit.   Subjective Assessment - 08/22/20 1534    Subjective "its just one of those days" " felt sore after last time but not increased pain"    Pertinent History gestational DM, hx cesarean x 4 most recent 2019, Chronic LBP, goes to therapy for traumatic birth (loss of a twin during previous  pregnancy)    Patient Stated Goals decrease pain, "to feel comfortable again"    Currently in Pain? Yes    Pain Score 7     Pain Location Back   and neck   Pain Orientation Lower;Mid    Pain Descriptors / Indicators Aching;Sore                             [facility] Adult PT Treatment/Exercise - 08/22/20 0001      Exercises   Exercises Lumbar;Knee/Hip      Lumbar Exercises: Stretches   Lower Trunk Rotation 10 seconds   2 sets 10 - first set using pball with incr pain   Hip Flexor Stretch Right;Left;2 reps;20 seconds   manual in thomas test position to lock out pelvis   Pelvic Tilt --    Other Lumbar Stretch Exercise Shoulder circles x 10 B    Other Lumbar Stretch Exercise QP cat camel x 5 with max tactile verbal cues.UT stretch and LS stretch B  x 20 seconds each      Lumbar Exercises: Aerobic   Nustep L4 x 5 minutes BUE/LE      Lumbar Exercises: Standing   Theraband Level (Row) Level 2 (Red)   10 x   Theraband Level (Shoulder Extension) Level 2 (Red)   10 x   Other Standing Lumbar Exercises red TB horizontal ABD  Lumbar Exercises: Supine   Bridge 5 reps   slow and controlled with PPT start     Lumbar Exercises: Prone   Single Arm Raise 10 reps   alternaitng     Modalities   Modalities Electrical Stimulation;Moist Heat      Moist Heat Therapy   Number Minutes Moist Heat 15 Minutes    Moist Heat Location Cervical;Lumbar Spine      Electrical Stimulation   Electrical Stimulation Location thoracolumbar spine    Electrical Stimulation Action IFC    Electrical Stimulation Parameters 15 minutes    Electrical Stimulation Goals Tone;Pain                    PT Short Term Goals - 08/01/20 1821      PT SHORT TERM GOAL #1   Title Independent with initial HEP    Time 2    Period Weeks    Status New    Target Date 08/15/20      PT SHORT TERM GOAL #2   Title Pt will be able to demo midline neutral spine posture in both sitting and standing     Time 2    Period Weeks    Status New    Target Date 08/15/20      PT SHORT TERM GOAL #3   Title Pt will be demo good body mechanics with lifting and moving weighted items to facilitate ability to pick up children safely    Time 2    Period Weeks    Status New    Target Date 08/15/20             PT Long Term Goals - 08/01/20 1821      PT LONG TERM GOAL #1   Title Independent with advanced HEP    Time 8    Period Weeks    Status New    Target Date 10/03/20      PT LONG TERM GOAL #2   Title BLE strength symmetrical to at least 4+/5    Time 8    Period Weeks    Status New    Target Date 10/03/20      PT LONG TERM GOAL #3   Title Pt will report </= 2/10 back pain without radiating symptoms into the LE with daily tasks and caregiving of children at home    Time 8    Period Weeks    Status New    Target Date 10/03/20      PT LONG TERM GOAL #4   Title Pt will be able to return to independent exercise program not limited by pain    Time 8    Period Weeks    Status New    Target Date 10/03/20                 Plan - 08/22/20 1535    Clinical Impression Statement Barbara Todd had alot of pain and discomfort in the neck and low back today which limited tolerance to exercises although very motivated to push into pain. Offered modalities end of session to help with pain management as she dmeonstrates alot of tension and stiffness along the spine and she responded nicely to estim and heat.    Personal Factors and Comorbidities Past/Current Experience    Rehab Potential Good    PT Frequency 1x / week    PT Duration 8 weeks    PT Treatment/Interventions ADLs/Self Care Home Management;Cryotherapy;Electrical Stimulation;Iontophoresis 4mg /ml Dexamethasone;Moist Heat;Traction;Neuromuscular re-education;Therapeutic  exercise;Therapeutic activities;Functional mobility training;Patient/family education;Manual techniques;Energy conservation;Dry needling;Taping    PT Next Visit Plan  Gentle spinal mobility, core stabilization and LE strengthening, and hip flexor stretch/mobility B. Manual and modalities as needed for trigger points and spasms along the back. overall very stiff and weak especially in the core    PT Home Exercise Plan see pt edu    Consulted and Agree with Plan of Care Patient           Patient will benefit from skilled therapeutic intervention in order to improve the following deficits and impairments:  Decreased range of motion,Increased muscle spasms,Pain,Impaired flexibility,Decreased strength,Postural dysfunction,Improper body mechanics  Visit Diagnosis: Abnormal posture  Muscle weakness (generalized)  Cramp and spasm  Chronic low back pain with right-sided sciatica, unspecified back pain laterality  Cervicalgia     Problem List Patient Active Problem List   Diagnosis Date Noted  . 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 Unwanted fertility 01/14/2018  . History of cesarean delivery, antepartum 10/21/2017  . History of gestational diabetes 02/21/2017    Anson Crofts , PT, DPT 08/22/2020, 4:11 PM  [facility] [address] [place], [place], [address] Phone: [phone]   Fax:  [phone]  Name: Barbara Todd MRN: [id number] Date of Birth: 1980/10/23

## 2020-08-29 ENCOUNTER — Ambulatory Visit: Payer: Medicaid Other

## 2020-09-01 ENCOUNTER — Other Ambulatory Visit: Payer: Self-pay

## 2020-09-01 ENCOUNTER — Ambulatory Visit: Payer: Medicaid Other

## 2020-09-01 DIAGNOSIS — M542 Cervicalgia: Secondary | ICD-10-CM

## 2020-09-01 DIAGNOSIS — R293 Abnormal posture: Secondary | ICD-10-CM

## 2020-09-01 DIAGNOSIS — G8929 Other chronic pain: Secondary | ICD-10-CM

## 2020-09-01 DIAGNOSIS — M6281 Muscle weakness (generalized): Secondary | ICD-10-CM

## 2020-09-01 DIAGNOSIS — M5441 Lumbago with sciatica, right side: Secondary | ICD-10-CM

## 2020-09-01 DIAGNOSIS — R252 Cramp and spasm: Secondary | ICD-10-CM

## 2020-09-01 NOTE — Therapy (Signed)
[facility] [address] [place], [place], [address] Phone: [phone]   Fax:  [phone]  Physical Therapy Treatment  Patient Details  Name: Barbara Todd MRN: [id number] Date of Birth: [birth year] Referring Provider (PT): Hilts   Encounter Date: 09/01/2020   PT End of Session - 09/01/20 0856    Visit Number 4    Number of Visits 10    Date for PT Re-Evaluation 09/30/20    Authorization Type [place] Medicaid [employer]    PT Start Time [phone]    PT Stop Time 0930    PT Time Calculation (min) 40 min    Activity Tolerance Patient tolerated treatment well    Behavior During Therapy [facility] for tasks assessed/performed           Past Medical History:  Diagnosis Date  . Chronic back pain   . Diabetes mellitus without complication (HCC)   . Gestational diabetes   . Numbness and tingling     Past Surgical History:  Procedure Laterality Date  . CESAREAN SECTION     total of 4  . CESAREAN SECTION MULTI-GESTATIONAL WITH TUBAL Bilateral 01/23/2017   Procedure: CESAREAN SECTION MULTI-GESTATIONAL;  Surgeon: Tilda Burrow, MD;  Location: [facility] BIRTHING SUITES;  Service: Obstetrics;  Laterality: Bilateral;  . CESAREAN SECTION WITH BILATERAL TUBAL LIGATION N/A 04/21/2018   Procedure: REPEAT CESAREAN SECTION WITH BILATERAL TUBAL LIGATION;  Surgeon: Adam Phenix, MD;  Location: [facility] BIRTHING SUITES;  Service: Obstetrics;  Laterality: N/A;  . DILATION AND EVACUATION N/A 08/30/2014   Procedure: DILATATION AND EVACUATION;  Surgeon: Catalina Antigua, MD;  Location: [facility];  Service: Gynecology;  Laterality: N/A;    There were no vitals filed for this visit.   Subjective Assessment - 09/01/20 0853    Subjective "better" Doing the exercises sometimes. Has been doing some walks in the neighborhood but till gets heaviness in the right leg    Pertinent History gestational DM, hx cesarean x 4 most recent 2019, Chronic LBP, goes to therapy for traumatic  birth (loss of a twin during previous pregnancy)    Patient Stated Goals decrease pain, "to feel comfortable again"    Currently in Pain? Yes    Pain Score 4     Pain Location Back   & neck   Pain Orientation Lower;Mid    Pain Descriptors / Indicators Aching;Sore                             [facility] Adult PT Treatment/Exercise - 09/01/20 0001      Lumbar Exercises: Stretches   Double Knee to Chest Stretch --   10 reps with ball, 5" holds at end   Lower Trunk Rotation --   10 reps   Hip Flexor Stretch Right;Left;2 reps;20 seconds   manual in thomas test position to lock out pelvis - 3rd rep on each using contract relax   Other Lumbar Stretch Exercise bridge on ball 2 x 10 - max cues for PPT      Lumbar Exercises: Aerobic   UBE (Upper Arm Bike) L 1.5 - 2 min fwd, 2 bwd    Nustep L5 x 6 minutes BUE/LE      Lumbar Exercises: Machines for Strengthening   Other Lumbar Machine Exercise Rows & Lats 15# 2 x 10      Lumbar Exercises: Standing   Theraband Level (Shoulder Extension) Level 2 (Red)   10 x  2   Other Standing Lumbar Exercises red TB horizontal ABD x 10 - cues for ab engagement/ribs down      Lumbar Exercises: Supine   Dead Bug 10 reps   max cues for sequencing, TA engagement difficult but able to engage with cues.   Straight Leg Raise 10 reps   bilateral with cues for TrA enggement                   PT Short Term Goals - 09/01/20 0857      PT SHORT TERM GOAL #1   Title Independent with initial HEP    Time 2    Period Weeks    Status Achieved      PT SHORT TERM GOAL #2   Title Pt will be able to demo midline neutral spine posture in both sitting and standing    Time 2    Period Weeks    Status Achieved      PT SHORT TERM GOAL #3   Title Pt will be demo good body mechanics with lifting and moving weighted items to facilitate ability to pick up children safely    Time 2    Period Weeks    Status On-going             PT Long Term Goals  - 08/01/20 1821      PT LONG TERM GOAL #1   Title Independent with advanced HEP    Time 8    Period Weeks    Status New    Target Date 10/03/20      PT LONG TERM GOAL #2   Title BLE strength symmetrical to at least 4+/5    Time 8    Period Weeks    Status New    Target Date 10/03/20      PT LONG TERM GOAL #3   Title Pt will report </= 2/10 back pain without radiating symptoms into the LE with daily tasks and caregiving of children at home    Time 8    Period Weeks    Status New    Target Date 10/03/20      PT LONG TERM GOAL #4   Title Pt will be able to return to independent exercise program not limited by pain    Time 8    Period Weeks    Status New    Target Date 10/03/20                 Plan - 09/01/20 0856    Clinical Impression Statement Alot less pain reported today so able to tolerate more exercises. Continued with core engangement with all exercises just requires some cues/reminders but good engagement when she does. Continue to progress as toelrated    Personal Factors and Comorbidities Past/Current Experience    Rehab Potential Good    PT Frequency 1x / week    PT Duration 8 weeks    PT Treatment/Interventions ADLs/Self Care Home Management;Cryotherapy;Electrical Stimulation;Iontophoresis 4mg /ml Dexamethasone;Moist Heat;Traction;Neuromuscular re-education;Therapeutic exercise;Therapeutic activities;Functional mobility training;Patient/family education;Manual techniques;Energy conservation;Dry needling;Taping    PT Next Visit Plan Gentle spinal mobility, core stabilization and LE strengthening, and hip flexor stretch/mobility B. Manual and modalities as needed for trigger points and spasms along the back. overall very stiff and weak especially in the core    PT Home Exercise Plan see pt edu    Consulted and Agree with Plan of Care Patient           Patient  will benefit from skilled therapeutic intervention in order to improve the following deficits and  impairments:  Decreased range of motion,Increased muscle spasms,Pain,Impaired flexibility,Decreased strength,Postural dysfunction,Improper body mechanics  Visit Diagnosis: Abnormal posture  Muscle weakness (generalized)  Cramp and spasm  Chronic low back pain with right-sided sciatica, unspecified back pain laterality  Cervicalgia     Problem List Patient Active Problem List   Diagnosis Date Noted  . 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 Unwanted fertility 01/14/2018  . History of cesarean delivery, antepartum 10/21/2017  . History of gestational diabetes 02/21/2017    Anson Crofts, PT, DPT 09/01/2020, 9:30 AM  [facility] [address] [place], [place], [address] Phone: [phone]   Fax:  [phone]  Name: Barbara Todd MRN: [id number] Date of Birth: 11-20-1980

## 2020-09-05 ENCOUNTER — Ambulatory Visit: Payer: Medicaid Other

## 2020-09-05 ENCOUNTER — Other Ambulatory Visit: Payer: Self-pay

## 2020-09-05 DIAGNOSIS — M5441 Lumbago with sciatica, right side: Secondary | ICD-10-CM

## 2020-09-05 DIAGNOSIS — G8929 Other chronic pain: Secondary | ICD-10-CM

## 2020-09-05 DIAGNOSIS — M542 Cervicalgia: Secondary | ICD-10-CM

## 2020-09-05 DIAGNOSIS — R293 Abnormal posture: Secondary | ICD-10-CM

## 2020-09-05 DIAGNOSIS — M6281 Muscle weakness (generalized): Secondary | ICD-10-CM

## 2020-09-05 DIAGNOSIS — R252 Cramp and spasm: Secondary | ICD-10-CM

## 2020-09-05 NOTE — Therapy (Signed)
[facility] [address] [place], [place], [address] Phone: [phone]   Fax:  [phone]  Physical Therapy Treatment  Patient Details  Name: Barbara Todd MRN: [id number] Date of Birth: [birth year] Referring Provider (PT): Hilts   Encounter Date: 09/05/2020   PT End of Session - 09/05/20 1618    Visit Number 5    Number of Visits 10    Date for PT Re-Evaluation 09/30/20    Authorization Type [place] Medicaid Well[employer]    PT Start Time 1615    PT Stop Time 1657    PT Time Calculation (min) 42 min    Activity Tolerance Patient tolerated treatment well    Behavior During Therapy [facility] for tasks assessed/performed           Past Medical History:  Diagnosis Date  . Chronic back pain   . Diabetes mellitus without complication (HCC)   . Gestational diabetes   . Numbness and tingling     Past Surgical History:  Procedure Laterality Date  . CESAREAN SECTION     total of 4  . CESAREAN SECTION MULTI-GESTATIONAL WITH TUBAL Bilateral 01/23/2017   Procedure: CESAREAN SECTION MULTI-GESTATIONAL;  Surgeon: Tilda Burrow, MD;  Location: [facility] BIRTHING SUITES;  Service: Obstetrics;  Laterality: Bilateral;  . CESAREAN SECTION WITH BILATERAL TUBAL LIGATION N/A 04/21/2018   Procedure: REPEAT CESAREAN SECTION WITH BILATERAL TUBAL LIGATION;  Surgeon: Adam Phenix, MD;  Location: [facility] BIRTHING SUITES;  Service: Obstetrics;  Laterality: N/A;  . DILATION AND EVACUATION N/A 08/30/2014   Procedure: DILATATION AND EVACUATION;  Surgeon: Catalina Antigua, MD;  Location: [facility];  Service: Gynecology;  Laterality: N/A;    There were no vitals filed for this visit.   Subjective Assessment - 09/05/20 1618    Subjective Just a little tense    Pertinent History gestational DM, hx cesarean x 4 most recent 2019, Chronic LBP, goes to therapy for traumatic birth (loss of a twin during previous pregnancy)    Patient Stated Goals decrease pain, "to feel  comfortable again"    Currently in Pain? No/denies                             [facility] Adult PT Treatment/Exercise - 09/05/20 0001      Lumbar Exercises: Stretches   Double Knee to Chest Stretch --   10 reps with ball, 5" holds at end   Lower Trunk Rotation --   10 reps       Other Lumbar Stretch Exercise bridge on ball 2 x 10 - max cues for PPT    Other Lumbar Stretch Exercise Wall angels with TA bracing/PPT at wall x10.  Lumbar forward flexion stretch at countertop x 10, maxcues for slow controlled mobility- alot of fear and guarding due to previous pain with bending forward.   3 way pball stretch x 10 in sitting      Lumbar Exercises: Aerobic   UBE (Upper Arm Bike) L3: 2 fwd, 3 bwd    Nustep L5 x 6 minutes BUE/LE      Lumbar Exercises: Machines for Strengthening   Other Lumbar Machine Exercise Rows & Lats 20# 2 x 10    Other Lumbar Machine Exercise Pulley shoulder ext 10# 10 x 2        Lumbar Exercises: Supine   Dead Bug 10 reps   max cues for sequencing, TA engagement difficult but able to engage  with cues.   Straight Leg Raise 10 reps   bilateral with cues for TrA enggement                   PT Short Term Goals - 09/01/20 0857      PT SHORT TERM GOAL #1   Title Independent with initial HEP    Time 2    Period Weeks    Status Achieved      PT SHORT TERM GOAL #2   Title Pt will be able to demo midline neutral spine posture in both sitting and standing    Time 2    Period Weeks    Status Achieved      PT SHORT TERM GOAL #3   Title Pt will be demo good body mechanics with lifting and moving weighted items to facilitate ability to pick up children safely    Time 2    Period Weeks    Status On-going             PT Long Term Goals - 08/01/20 1821      PT LONG TERM GOAL #1   Title Independent with advanced HEP    Time 8    Period Weeks    Status New    Target Date 10/03/20      PT LONG TERM GOAL #2   Title BLE strength  symmetrical to at least 4+/5    Time 8    Period Weeks    Status New    Target Date 10/03/20      PT LONG TERM GOAL #3   Title Pt will report </= 2/10 back pain without radiating symptoms into the LE with daily tasks and caregiving of children at home    Time 8    Period Weeks    Status New    Target Date 10/03/20      PT LONG TERM GOAL #4   Title Pt will be able to return to independent exercise program not limited by pain    Time 8    Period Weeks    Status New    Target Date 10/03/20                 Plan - 09/05/20 1619    Clinical Impression Statement Tolerated progression of exercises nicely. Some shoulder compensations with shoulder extensions requiring mod cues for pacing and for keeping shoulders down. Controlled spinal mobility and core engagement easier in supine, alot more guarding in standing but did well with practice of slow controlled motions. Alot of fear avoidance but respoded nicely with willingness to try exercises with education - no pain reproduced    Personal Factors and Comorbidities Past/Current Experience    Rehab Potential Good    PT Frequency 1x / week    PT Duration 8 weeks    PT Treatment/Interventions ADLs/Self Care Home Management;Cryotherapy;Electrical Stimulation;Iontophoresis 4mg /ml Dexamethasone;Moist Heat;Traction;Neuromuscular re-education;Therapeutic exercise;Therapeutic activities;Functional mobility training;Patient/family education;Manual techniques;Energy conservation;Dry needling;Taping    PT Next Visit Plan Gentle spinal mobility, core stabilization and LE strengthening, and hip flexor stretch/mobility B. Manual and modalities as needed for trigger points and spasms along the back. overall very stiff and weak especially in the core    PT Home Exercise Plan see pt edu    Consulted and Agree with Plan of Care Patient           Patient will benefit from skilled therapeutic intervention in order to improve the following deficits and  impairments:  Decreased range of motion,Increased muscle spasms,Pain,Impaired flexibility,Decreased strength,Postural dysfunction,Improper body mechanics  Visit Diagnosis: Abnormal posture  Muscle weakness (generalized)  Cramp and spasm  Chronic low back pain with right-sided sciatica, unspecified back pain laterality  Cervicalgia     Problem List Patient Active Problem List   Diagnosis Date Noted  . 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 Unwanted fertility 01/14/2018  . History of cesarean delivery, antepartum 10/21/2017  . History of gestational diabetes 02/21/2017    Anson Crofts, PT, DPT 09/05/2020, 4:57 PM  [facility] [address] [place], [place], [address] Phone: [phone]   Fax:  [phone]  Name: Barbara Todd MRN: [id number] Date of Birth: [birth year]

## 2020-09-12 ENCOUNTER — Other Ambulatory Visit: Payer: Self-pay

## 2020-09-12 ENCOUNTER — Ambulatory Visit: Payer: Medicaid Other | Attending: Family Medicine

## 2020-09-12 DIAGNOSIS — G8929 Other chronic pain: Secondary | ICD-10-CM | POA: Diagnosis present

## 2020-09-12 DIAGNOSIS — M5441 Lumbago with sciatica, right side: Secondary | ICD-10-CM | POA: Diagnosis present

## 2020-09-12 DIAGNOSIS — R293 Abnormal posture: Secondary | ICD-10-CM | POA: Insufficient documentation

## 2020-09-12 DIAGNOSIS — R252 Cramp and spasm: Secondary | ICD-10-CM | POA: Diagnosis present

## 2020-09-12 DIAGNOSIS — M6281 Muscle weakness (generalized): Secondary | ICD-10-CM | POA: Insufficient documentation

## 2020-09-12 DIAGNOSIS — M542 Cervicalgia: Secondary | ICD-10-CM | POA: Insufficient documentation

## 2020-09-12 NOTE — Therapy (Signed)
[place]. [place], [place], [address] Phone: [phone]   Fax:  [phone]  Physical Therapy Treatment  Patient Details  Name: Barbara Todd MRN: [id number] Date of Birth: January 24, 1981 Referring Provider (PT): Hilts   Encounter Date: 09/12/2020   PT End of Session - 09/12/20 1139    Visit Number 6    Number of Visits 10    Date for PT Re-Evaluation 09/30/20    Authorization Type  Medicaid [employer]    PT Start Time 1108    PT Stop Time 1149    PT Time Calculation (min) 41 min    Activity Tolerance Patient tolerated treatment well;Patient limited by pain    Behavior During Therapy [facility] for tasks assessed/performed           Past Medical History:  Diagnosis Date  . Chronic back pain   . Diabetes mellitus without complication ([place])   . Gestational diabetes   . Numbness and tingling     Past Surgical History:  Procedure Laterality Date  . CESAREAN SECTION     total of 4  . CESAREAN SECTION MULTI-GESTATIONAL WITH TUBAL Bilateral 01/23/2017   Procedure: CESAREAN SECTION MULTI-GESTATIONAL;  Surgeon: Jonnie Kind, MD;  Location: [place];  Service: Obstetrics;  Laterality: Bilateral;  . CESAREAN SECTION WITH BILATERAL TUBAL LIGATION N/A 04/21/2018   Procedure: REPEAT CESAREAN SECTION WITH BILATERAL TUBAL LIGATION;  Surgeon: Woodroe Mode, MD;  Location: [place];  Service: Obstetrics;  Laterality: N/A;  . DILATION AND EVACUATION N/A 08/30/2014   Procedure: DILATATION AND EVACUATION;  Surgeon: Mora Bellman, MD;  Location: [facility];  Service: Gynecology;  Laterality: N/A;    There were no vitals filed for this visit.   Subjective Assessment - 09/12/20 1112    Subjective ery sore today. Toss and turn lst night because of it. 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    Pertinent History gestational DM, hx cesarean x 4 most recent 2019, Chronic LBP, goes to therapy for  traumatic birth (loss of a twin during previous pregnancy)    Patient Stated Goals decrease pain, "to feel comfortable again"    Currently in Pain? Yes    Pain Score 10-Worst pain ever    Pain Location Back    Pain Orientation --   "my entire back"                            [facility] Adult PT Treatment/Exercise - 09/12/20 0001      Lumbar Exercises: Stretches   Double Knee to Chest Stretch --   10 reps with ball, 5" holds at end   Lower Trunk Rotation --   2 x 10 - 1 set with ball   Hip Flexor Stretch Right;Left;4 reps;30 seconds   in thomas test poistion     Moist Heat Therapy   Number Minutes Moist Heat 10 Minutes    Moist Heat Location --   thoracolumbar     Electrical Stimulation   Electrical Stimulation Location thoracolumbar spine    Electrical Stimulation Action IFC    Electrical Stimulation Parameters 10    Electrical Stimulation Goals Tone;Pain      Manual Therapy   Manual Therapy Soft tissue mobilization;Myofascial release    Manual therapy comments STM and MFR to thoracolumbar paraspinals. Multiple Trp along right paraspinals and QL. Responded fairly to sustained deep pressure.  PT Short Term Goals - 09/01/20 0857      PT SHORT TERM GOAL #1   Title Independent with initial HEP    Time 2    Period Weeks    Status Achieved      PT SHORT TERM GOAL #2   Title Pt will be able to demo midline neutral spine posture in both sitting and standing    Time 2    Period Weeks    Status Achieved      PT SHORT TERM GOAL #3   Title Pt will be demo good body mechanics with lifting and moving weighted items to facilitate ability to pick up children safely    Time 2    Period Weeks    Status On-going             PT Long Term Goals - 08/01/20 1821      PT LONG TERM GOAL #1   Title Independent with advanced HEP    Time 8    Period Weeks    Status New    Target Date 10/03/20      PT LONG TERM GOAL #2   Title BLE  strength symmetrical to at least 4+/5    Time 8    Period Weeks    Status New    Target Date 10/03/20      PT LONG TERM GOAL #3   Title Pt will report </= 2/10 back pain without radiating symptoms into the LE with daily tasks and caregiving of children at home    Time 8    Period Weeks    Status New    Target Date 10/03/20      PT LONG TERM GOAL #4   Title Pt will be able to return to independent exercise program not limited by pain    Time 8    Period Weeks    Status New    Target Date 10/03/20                 Plan - 09/12/20 1140    Clinical Impression Statement Came in with exacerbation of back pain today, alot of tension. Focus of session on stretching and mobility with good toelrance. Manual tx to thoracolumbar spine with alot of TTP and palpable triggerpoints Responded fairly to manual STM and MFR. Offered estim and heat for pain mgmt end of session today and this helped further relieve tension.    Personal Factors and Comorbidities Past/Current Experience    Rehab Potential Good    PT Frequency 1x / week    PT Duration 8 weeks    PT Treatment/Interventions ADLs/Self Care Home Management;Cryotherapy;Electrical Stimulation;Iontophoresis 22m/ml Dexamethasone;Moist Heat;Traction;Neuromuscular re-education;Therapeutic exercise;Therapeutic activities;Functional mobility training;Patient/family education;Manual techniques;Energy conservation;Dry needling;Taping    PT Next Visit Plan Gentle spinal mobility, core stabilization and LE strengthening, and hip flexor stretch/mobility B. Manual and modalities as needed for trigger points and spasms along the back. overall very stiff and weak especially in the core    PT Home Exercise Plan see pt edu    Consulted and Agree with Plan of Care Patient           Patient will benefit from skilled therapeutic intervention in order to improve the following deficits and impairments:  Decreased range of motion,Increased muscle  spasms,Pain,Impaired flexibility,Decreased strength,Postural dysfunction,Improper body mechanics  Visit Diagnosis: Abnormal posture  Muscle weakness (generalized)  Cramp and spasm  Chronic low back pain with right-sided sciatica, unspecified back pain laterality  Cervicalgia  Problem List Patient Active Problem List   Diagnosis Date Noted  . 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 Unwanted fertility 01/14/2018  . History of cesarean delivery, antepartum 10/21/2017  . History of gestational diabetes 02/21/2017    Hall Busing 09/12/2020, 11:56 AM  [place]. [place], [place], [address] Phone: [phone]   Fax:  [phone]  Name: PRUDENCE HEINY MRN: [id number] Date of Birth: Oct 30, 1980

## 2020-09-19 ENCOUNTER — Ambulatory Visit: Payer: Medicaid Other

## 2020-09-26 ENCOUNTER — Encounter: Payer: Self-pay | Admitting: Obstetrics

## 2020-09-26 ENCOUNTER — Other Ambulatory Visit (HOSPITAL_COMMUNITY)
Admission: RE | Admit: 2020-09-26 | Discharge: 2020-09-26 | Disposition: A | Discharge status: Home / Self Care | Payer: Medicaid Other | Source: Ambulatory Visit | Attending: Obstetrics | Admitting: Obstetrics

## 2020-09-26 ENCOUNTER — Ambulatory Visit (INDEPENDENT_AMBULATORY_CARE_PROVIDER_SITE_OTHER): Payer: Medicaid Other | Admitting: Obstetrics

## 2020-09-26 ENCOUNTER — Other Ambulatory Visit: Payer: Self-pay

## 2020-09-26 ENCOUNTER — Ambulatory Visit: Payer: Medicaid Other

## 2020-09-26 VITALS — BP 102/71 | HR 86 | Wt 169.0 lb

## 2020-09-26 DIAGNOSIS — R293 Abnormal posture: Secondary | ICD-10-CM

## 2020-09-26 DIAGNOSIS — J301 Allergic rhinitis due to pollen: Secondary | ICD-10-CM | POA: Diagnosis not present

## 2020-09-26 DIAGNOSIS — Z01419 Encounter for gynecological examination (general) (routine) without abnormal findings: Secondary | ICD-10-CM | POA: Diagnosis not present

## 2020-09-26 DIAGNOSIS — G8929 Other chronic pain: Secondary | ICD-10-CM

## 2020-09-26 DIAGNOSIS — R252 Cramp and spasm: Secondary | ICD-10-CM

## 2020-09-26 DIAGNOSIS — M542 Cervicalgia: Secondary | ICD-10-CM

## 2020-09-26 DIAGNOSIS — M6281 Muscle weakness (generalized): Secondary | ICD-10-CM

## 2020-09-26 DIAGNOSIS — M62830 Muscle spasm of back: Secondary | ICD-10-CM

## 2020-09-26 MED ORDER — LORATADINE 10 MG PO TABS: 10.0000 mg | ORAL_TABLET | Freq: Every day | ORAL | 11 refills | Status: DC

## 2020-09-26 MED ORDER — IBUPROFEN 800 MG PO TABS: 800.0000 mg | ORAL_TABLET | Freq: Three times a day (TID) | ORAL | 5 refills | Status: DC | PRN

## 2020-09-26 MED ORDER — CYCLOBENZAPRINE HCL 10 MG PO TABS: 10.0000 mg | ORAL_TABLET | Freq: Three times a day (TID) | ORAL | 4 refills | Status: DC | PRN

## 2020-09-26 NOTE — Progress Notes (Signed)
Patient presents for Annual Exam today.  LMP: 09/16/20 Monthly  Last pap:07/19/2016 WNL  Contraception: None  STD Screening: Declines Family Hx of Breast Cancer: None per pt   CC: None
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
## 2020-09-29 ENCOUNTER — Ambulatory Visit: Payer: Medicaid Other | Admitting: Rehabilitative and Restorative Service Providers"

## 2020-09-29 LAB — CYTOLOGY - PAP
Comment: NEGATIVE
Diagnosis: NEGATIVE
High risk HPV: NEGATIVE

## 2021-04-03 ENCOUNTER — Other Ambulatory Visit: Payer: Self-pay | Admitting: Obstetrics

## 2021-04-03 DIAGNOSIS — M62830 Muscle spasm of back: Secondary | ICD-10-CM

## 2021-10-21 ENCOUNTER — Other Ambulatory Visit: Payer: Self-pay | Admitting: Obstetrics

## 2021-10-21 DIAGNOSIS — M62830 Muscle spasm of back: Secondary | ICD-10-CM

## 2021-11-13 ENCOUNTER — Other Ambulatory Visit: Payer: Self-pay | Admitting: Obstetrics

## 2021-11-13 DIAGNOSIS — J301 Allergic rhinitis due to pollen: Secondary | ICD-10-CM

## 2022-03-27 ENCOUNTER — Other Ambulatory Visit: Payer: Self-pay | Admitting: Obstetrics

## 2022-03-27 DIAGNOSIS — M62830 Muscle spasm of back: Secondary | ICD-10-CM

## 2022-05-12 ENCOUNTER — Other Ambulatory Visit: Payer: Self-pay | Admitting: Obstetrics

## 2022-05-12 DIAGNOSIS — M62830 Muscle spasm of back: Secondary | ICD-10-CM

## 2023-07-12 ENCOUNTER — Other Ambulatory Visit: Payer: Self-pay | Admitting: Obstetrics

## 2023-07-12 DIAGNOSIS — M62830 Muscle spasm of back: Secondary | ICD-10-CM
